# Patient Record
Sex: Male | Born: 1945 | Race: White | Hispanic: No | Marital: Married | Smoking: Never smoker
Health system: Southern US, Community
[De-identification: ages and names within clinical notes are randomized; demographics above are authoritative.]

## PROBLEM LIST (undated history)

## (undated) DIAGNOSIS — G629 Polyneuropathy, unspecified: Secondary | ICD-10-CM

## (undated) DIAGNOSIS — I10 Essential (primary) hypertension: Secondary | ICD-10-CM

## (undated) DIAGNOSIS — M1990 Unspecified osteoarthritis, unspecified site: Secondary | ICD-10-CM

---

## 1999-03-26 ENCOUNTER — Encounter: Admission: RE | Admit: 1999-03-26 | Discharge: 1999-04-21 | Payer: Self-pay | Admitting: Family Medicine

## 2002-11-22 ENCOUNTER — Ambulatory Visit (HOSPITAL_COMMUNITY): Admission: RE | Admit: 2002-11-22 | Discharge: 2002-11-22 | Payer: Self-pay | Admitting: *Deleted

## 2016-06-16 ENCOUNTER — Ambulatory Visit (INDEPENDENT_AMBULATORY_CARE_PROVIDER_SITE_OTHER): Payer: Commercial Managed Care - PPO | Admitting: Physical Medicine and Rehabilitation

## 2016-06-16 DIAGNOSIS — M47816 Spondylosis without myelopathy or radiculopathy, lumbar region: Secondary | ICD-10-CM

## 2016-06-16 DIAGNOSIS — M545 Low back pain: Secondary | ICD-10-CM | POA: Diagnosis not present

## 2016-06-16 DIAGNOSIS — M25552 Pain in left hip: Secondary | ICD-10-CM

## 2016-06-23 ENCOUNTER — Encounter (INDEPENDENT_AMBULATORY_CARE_PROVIDER_SITE_OTHER): Payer: Commercial Managed Care - PPO | Admitting: Physical Medicine and Rehabilitation

## 2016-06-23 DIAGNOSIS — M47816 Spondylosis without myelopathy or radiculopathy, lumbar region: Secondary | ICD-10-CM | POA: Diagnosis not present

## 2016-08-09 ENCOUNTER — Telehealth (INDEPENDENT_AMBULATORY_CARE_PROVIDER_SITE_OTHER): Payer: Self-pay | Admitting: Physical Medicine and Rehabilitation

## 2016-08-09 NOTE — Telephone Encounter (Signed)
Called patient to discuss/ schedule. Voicemail has not been set up.

## 2016-08-09 NOTE — Telephone Encounter (Signed)
If helped more than 50% then repeat, if unsure then ov

## 2016-08-29 ENCOUNTER — Encounter (INDEPENDENT_AMBULATORY_CARE_PROVIDER_SITE_OTHER): Payer: Self-pay | Admitting: Physical Medicine and Rehabilitation

## 2016-08-29 ENCOUNTER — Ambulatory Visit (INDEPENDENT_AMBULATORY_CARE_PROVIDER_SITE_OTHER): Payer: Commercial Managed Care - PPO | Admitting: Physical Medicine and Rehabilitation

## 2016-08-29 VITALS — BP 127/64 | HR 62 | Temp 98.3°F

## 2016-08-29 DIAGNOSIS — M545 Low back pain, unspecified: Secondary | ICD-10-CM | POA: Insufficient documentation

## 2016-08-29 DIAGNOSIS — G8929 Other chronic pain: Secondary | ICD-10-CM

## 2016-08-29 DIAGNOSIS — M47816 Spondylosis without myelopathy or radiculopathy, lumbar region: Secondary | ICD-10-CM | POA: Insufficient documentation

## 2016-08-29 MED ORDER — LIDOCAINE HCL (PF) 1 % IJ SOLN: 0.3300 mL | Freq: Once | INTRAMUSCULAR | Status: DC

## 2016-08-29 MED ORDER — METHYLPREDNISOLONE ACETATE 80 MG/ML IJ SUSP
80.0000 mg | Freq: Once | INTRAMUSCULAR | Status: AC
  Administered 2016-08-29: 80 mg

## 2016-08-29 NOTE — Procedures (Signed)
Lumbar Facet Joint Intra-Articular Injection(s) with Fluoroscopic Guidance  [name]      Date of Birth: 1946-06-23 MRN: [id number] PCP: [name],Ricardo LYNN, MD      Visit Date: 08/29/2016   Universal Protocol:    Date/Time: 12/18/174:25 PM  Consent Given By: the patient  Position: PRONE   Additional Comments: Vital signs were monitored before and after the procedure. Patient was prepped and draped in the usual sterile fashion. The correct patient, procedure, and site was verified.   Injection Procedure Details:  Procedure Site One Meds Administered:  Meds ordered this encounter  Medications  . lidocaine (PF) (XYLOCAINE) 1 % injection 0.3 mL  . methylPREDNISolone acetate (DEPO-MEDROL) injection 80 mg     Laterality: Left  Location/Site:  L4-L5 L5-S1  Needle size: 22 guage  Needle type: Spinal  Needle Placement: Articular  Findings:  -Contrast Used: 1 mL iohexol 180 mg iodine/mL   -Comments: Excellent flow of contrast producing a partial arthrogram.  Procedure Details: The fluoroscope beam is vertically oriented in AP, and the inferior recess is visualized beneath the lower pole of the inferior apophyseal process, which represents the target point for needle insertion. When direct visualization is difficult the target point is located at the medial projection of the vertebral pedicle. The region overlying each aforementioned target is locally anesthetized with a 1 to 2 ml. volume of 1% Lidocaine without Epinephrine.   The spinal needle was inserted into each of the above mentioned facet joints using biplanar fluoroscopic guidance. A 0.25 to 0.5 ml. volume of Isovue-250 was injected and a partial facet joint arthrogram was obtained. A single spot film was obtained of the resulting arthrogram.    One to 1.25 ml of the steroid/anesthetic solution was then injected into each of the facet joints noted above.   Additional Comments:  The patient tolerated the  procedure well Dressing: Band-Aid    Post-procedure details: Patient was observed during the procedure. Post-procedure instructions were reviewed.  Patient left the clinic in stable condition.

## 2016-08-29 NOTE — Progress Notes (Signed)
Ricardo Stewart Stewart - 70 y.o. male MRN [id number]  Date of birth: 1946-07-22  Office Visit Note: Visit Date: 08/29/2016 PCP: [name],[name], MD Referred by: Sigmund HazelMiller, Lisa, MD  Subjective: Chief Complaint  Patient presents with  . Lower Back - Pain   HPI: [name] is a very pleasant and active 70 year old gentleman whose history is well documented in our prior notes. Briefly is someone that we saw originally in 2010 through Dr. Prince RomeHilts. He was having a lot of left-sided low back and buttock and hip pain. 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 Left-sided facet joint block was almost a miracle and lasted quite a bit of time. We repeated this in 2012 when he once again did very well. He continues to ambulate and walk for 5 miles almost every day. He has no neurogenic claudication symptoms but a lot of low back pain with standing. Patient here today for left side low back pain. States he had good relief with last injection for a couple of weeks and then pain right back the way it was and more often. He feels like his pain is worsened to a degree. Reviewing the images shows well-placed injection. He has not had any pain down the legs or focal weakness. No trauma.    ROS Otherwise per HPI.  Assessment & Plan: Visit Diagnoses:  1. Spondylosis without myelopathy or radiculopathy, lumbar region   2. Chronic left-sided low back pain without sciatica     Plan: Findings:  Plan today is diagnostic of therapeutic left L4-5 and L5-S1 facet joints. I think doing the L4-5 region may help. He has arthropathy at both levels. He is somewhat of a transitional segment as well. The plan will be if he doesn't get much relief to update his MRI from 2010 to see if there is more worsening stenosis. I cannot rule out his pain being from stenosis although he does not have really any clear claudication symptoms or radicular complaints. We can also regroup with  physical therapy at some point and that he is re: had a personality knows it does very well for his wife. I think that would be beneficial for him since he is active. He could end up being a candidate for radiofrequency ablation depending on how the new imaging looks.    Meds & Orders:  Meds ordered this encounter  Medications  . lidocaine (PF) (XYLOCAINE) 1 % injection 0.3 mL  . methylPREDNISolone acetate (DEPO-MEDROL) injection 80 mg    Orders Placed This Encounter  Procedures  . Nerve Block    Follow-up: Return if symptoms worsen or fail to improve after 2 weeks we'll order an MRI of the lumbar spine.   Procedures: No procedures performed  Lumbar Facet Joint Intra-Articular Injection(s) with Fluoroscopic Guidance  [name]      Date of Birth: 1946-07-22 MRN: [id number] PCP: [name],[name], MD      Visit Date: 08/29/2016   Universal Protocol:    Date/Time: 12/18/174:25 PM  Consent Given By: the patient  Position: PRONE   Additional Comments: Vital signs were monitored before and after the procedure. Patient was prepped and draped in the usual sterile fashion. The correct patient, procedure, and site was verified.   Injection Procedure Details:  Procedure Site One Meds Administered:  Meds ordered this encounter  Medications  . lidocaine (PF) (XYLOCAINE) 1 % injection 0.3 mL  . methylPREDNISolone acetate (DEPO-MEDROL) injection 80 mg  Laterality: Left  Location/Site:  L4-L5 L5-S1  Needle size: 22 guage  Needle type: Spinal  Needle Placement: Articular  Findings:  -Contrast Used: 1 mL iohexol 180 mg iodine/mL   -Comments: Excellent flow of contrast producing a partial arthrogram.  Procedure Details: The fluoroscope beam is vertically oriented in AP, and the inferior recess is visualized beneath the lower pole of the inferior apophyseal process, which represents the target point for needle insertion. When direct visualization is difficult  the target point is located at the medial projection of the vertebral pedicle. The region overlying each aforementioned target is locally anesthetized with a 1 to 2 ml. volume of 1% Lidocaine without Epinephrine.   The spinal needle was inserted into each of the above mentioned facet joints using biplanar fluoroscopic guidance. A 0.25 to 0.5 ml. volume of Isovue-250 was injected and a partial facet joint arthrogram was obtained. A single spot film was obtained of the resulting arthrogram.    One to 1.25 ml of the steroid/anesthetic solution was then injected into each of the facet joints noted above.   Additional Comments:  The patient tolerated the procedure well Dressing: Band-Aid    Post-procedure details: Patient was observed during the procedure. Post-procedure instructions were reviewed.  Patient left the clinic in stable condition.       Clinical History: No specialty comments available.  He reports that he has never smoked. He has never used smokeless tobacco. No results for input(s): HGBA1C, LABURIC in the last 8760 hours.  Objective:  VS:  HT:    WT:   BMI:     BP:127/64  HR:62bpm  TEMP:98.3 F (36.8 C)(Oral)  RESP:94 % Physical Exam  Musculoskeletal:  The patient ambulates without aid. He has concordant pain with extension rotation to the left. He has no pain over the greater trochanter. He has good distal strength.    Ortho Exam Imaging: No results found.  Past Medical/Family/Surgical/Social History: Medications & Allergies reviewed per EMR Patient Active Problem List   Diagnosis Date Noted  . Spondylosis without myelopathy or radiculopathy, lumbar region 08/29/2016  . Chronic left-sided low back pain without sciatica 08/29/2016   No past medical history on file. No family history on file. No past surgical history on file. Social History   Occupational History  . Not on file.   Social History Main Topics  . Smoking status: Never Smoker  . Smokeless  tobacco: Never Used  . Alcohol use Not on file  . Drug use: Unknown  . Sexual activity: Not on file

## 2016-08-29 NOTE — Patient Instructions (Signed)

## 2017-07-17 ENCOUNTER — Telehealth (INDEPENDENT_AMBULATORY_CARE_PROVIDER_SITE_OTHER): Payer: Self-pay | Admitting: Physical Medicine and Rehabilitation

## 2017-07-17 NOTE — Telephone Encounter (Signed)
Left message for patient to call back  

## 2017-07-17 NOTE — Telephone Encounter (Signed)
yes

## 2017-07-18 NOTE — Telephone Encounter (Signed)
Scheduled for 08/02/17 at 1600 with driver. Patient requested ov at same time to discuss.

## 2017-08-01 ENCOUNTER — Ambulatory Visit (INDEPENDENT_AMBULATORY_CARE_PROVIDER_SITE_OTHER): Payer: Self-pay

## 2017-08-01 ENCOUNTER — Encounter (INDEPENDENT_AMBULATORY_CARE_PROVIDER_SITE_OTHER): Payer: Self-pay | Admitting: Physical Medicine and Rehabilitation

## 2017-08-01 ENCOUNTER — Ambulatory Visit (INDEPENDENT_AMBULATORY_CARE_PROVIDER_SITE_OTHER): Payer: Medicare HMO | Admitting: Physical Medicine and Rehabilitation

## 2017-08-01 VITALS — BP 132/66 | HR 63

## 2017-08-01 DIAGNOSIS — M5416 Radiculopathy, lumbar region: Secondary | ICD-10-CM

## 2017-08-01 DIAGNOSIS — M545 Low back pain: Secondary | ICD-10-CM

## 2017-08-01 DIAGNOSIS — M47816 Spondylosis without myelopathy or radiculopathy, lumbar region: Secondary | ICD-10-CM | POA: Diagnosis not present

## 2017-08-01 DIAGNOSIS — G8929 Other chronic pain: Secondary | ICD-10-CM

## 2017-08-01 MED ORDER — METHYLPREDNISOLONE ACETATE 80 MG/ML IJ SUSP: 80.0000 mg | Freq: Once | INTRAMUSCULAR | Status: DC

## 2017-08-01 MED ORDER — LIDOCAINE HCL (PF) 1 % IJ SOLN: 2.0000 mL | Freq: Once | INTRAMUSCULAR | Status: DC

## 2017-08-01 NOTE — Progress Notes (Deleted)
Patient states he is here today due to cyst on his back that are pressing on a nerve causing radicular left leg pain. Does report numbness in left leg. He is able to get relief occasionally after laying flat for a few hours.

## 2017-08-01 NOTE — Progress Notes (Signed)
Ricardo Stewart - 71 y.o. male MRN [id number]  Date of birth: 02-Nov-1945  Office Visit Note: Visit Date: 08/01/2017 PCP: Sigmund HazelMiller, Lisa, MD Referred by: Sigmund HazelMiller, Lisa, MD  Subjective: Chief Complaint  Patient presents with  . Lower Back - Pain  . Left Hip - Pain  . Left Leg - Pain, Numbness   HPI: Ricardo Stewart is a very pleasant and active 71 year old gentleman whose history is well documented in our prior notes. Briefly is someone that we saw originally in 2010 through Dr. Prince RomeHilts. He was having a lot of left-sided low back and buttock and hip pain. 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  Lumbar spine MRI is again reviewed that shows moderate stenosis at L4-5 and L5-S1 with likely a transitional segment.  He had mainly facet arthropathy with facet joint cyst impacting the canal.  Left-sided facet joint block was almost a miracle and lasted quite a bit of time. We repeated this in 2012 when he once again did very well. He continues to ambulate and walk for 5 miles almost every day. He has no neurogenic claudication symptoms but a lot of low back pain with standing.  We ended up repeating the facet joint block and December of last year he reports he got some relief but it was not as dramatic as in the past.  He again is having this left buttock pain but is also having some numbness in the leg as well.  He reports his best position is lying flat and that standing is the worst.  Again as noted above he continues to walk and really does not have difficulty walking.  He has had no new trauma.  No focal weakness.  No real right-sided complaints.  He asked today about physical therapy and other avenues of treatment.  Greater than 50% of this visit (total duration of visit was 25 minutes) was spent in counseling and coordination of care discussing facet arthropathy and stenosis and the need for future MRI as well as physical therapy and  exercises.     Review of Systems  Constitutional: Negative for chills, fever, malaise/fatigue and weight loss.  HENT: Negative for hearing loss and sinus pain.   Eyes: Negative for blurred vision, double vision and photophobia.  Respiratory: Negative for cough and shortness of breath.   Cardiovascular: Negative for chest pain, palpitations and leg swelling.  Gastrointestinal: Negative for abdominal pain, nausea and vomiting.  Genitourinary: Negative for flank pain.  Musculoskeletal: Positive for back pain and joint pain. Negative for myalgias.  Skin: Negative for itching and rash.  Neurological: Positive for tingling. Negative for tremors, focal weakness and weakness.  Endo/Heme/Allergies: Negative.   Psychiatric/Behavioral: Negative for depression.  All other systems reviewed and are negative.  Otherwise per HPI.  Assessment & Plan: Visit Diagnoses:  1. Spondylosis without myelopathy or radiculopathy, lumbar region   2. Chronic left-sided low back pain without sciatica   3. Lumbar radiculopathy     Plan: Findings:  Chronic worsening left more than right axial low back pain worse with standing but really no claudication symptoms with walking.  Last MRI was quite a while ago in 2010.  He likely has had some progression of the stenosis.  He may have had some regression of the cyst which we do see at times.  We talked at length about activity modification and exercises and physical therapy.  Best approach at this point to repeat the  facet joint block because it has helped and just see once and for all if that gives him any relief.  He gives him a great deal of relief but is short-lived we could talk about possible radiofrequency ablation.  If it is also very short-lived or does not help very much I think the next step would be MRI of the lumbar spine to look for worsening stenosis or change this would also help predict his future back issues.  Lastly depending on relief would look at getting  him into a physical therapy program for core strengthening and he can talk to them about different activities that he would like to pursue.  Overall he is doing fairly well compared to how his back looked at 2010.  I do think ultimately we are going to get an MRI of the lumbar spine.  The procedure was performed today due to the fact that he is having significant symptoms and they have helped in the past.    Meds & Orders:  Meds ordered this encounter  Medications  . lidocaine (PF) (XYLOCAINE) 1 % injection 2 mL  . methylPREDNISolone acetate (DEPO-MEDROL) injection 80 mg    Orders Placed This Encounter  Procedures  . Facet Injection  . XR C-ARM NO REPORT    Follow-up: Return if symptoms worsen or fail to improve, for ? need for MRI.   Procedures: No procedures performed  Lumbar Facet Joint Intra-Articular Injection(s) with Fluoroscopic Guidance  [name]      Date of Birth: [birth year] MRN: [id number] PCP: Sigmund Hazel, MD      Visit Date: 08/01/2017   Universal Protocol:    Date/Time: 08/01/2017  Consent Given By: the patient  Position: PRONE   Additional Comments: Vital signs were monitored before and after the procedure. Patient was prepped and draped in the usual sterile fashion. The correct patient, procedure, and site was verified.   Injection Procedure Details:  Procedure Site One Meds Administered:  Meds ordered this encounter  Medications  . lidocaine (PF) (XYLOCAINE) 1 % injection 2 mL  . methylPREDNISolone acetate (DEPO-MEDROL) injection 80 mg     Laterality: Left  Location/Site: Patient clearly has a realized S1 transitional segment. L4-L5 L5-S1  Needle size: 22 guage  Needle type: Spinal  Needle Placement: Articular  Findings:  -Contrast Used: 1 mL iohexol 180 mg iodine/mL   -Comments: Excellent flow of contrast producing a partial arthrogram.  Procedure Details: The fluoroscope beam is vertically oriented in AP, and the inferior  recess is visualized beneath the lower pole of the inferior apophyseal process, which represents the target point for needle insertion. When direct visualization is difficult the target point is located at the medial projection of the vertebral pedicle. The region overlying each aforementioned target is locally anesthetized with a 1 to 2 ml. volume of 1% Lidocaine without Epinephrine.   The spinal needle was inserted into each of the above mentioned facet joints using biplanar fluoroscopic guidance. A 0.25 to 0.5 ml. volume of Isovue-250 was injected and a partial facet joint arthrogram was obtained. A single spot film was obtained of the resulting arthrogram.    One to 1.25 ml of the steroid/anesthetic solution was then injected into each of the facet joints noted above.   Additional Comments:  The patient tolerated the procedure well Dressing: Band-Aid    Post-procedure details: Patient was observed during the procedure. Post-procedure instructions were reviewed.  Patient left the clinic in stable condition.     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 he has never used smokeless tobacco. No results for input(s): HGBA1C, LABURIC in the last 8760 hours.  Objective:  VS:  HT:    WT:   BMI:     BP:132/66  HR:63bpm  TEMP: ( )  RESP:  Physical Exam  Constitutional: He is oriented to person, place, and time. He appears well-developed and well-nourished. No distress.  HENT:  Head: Normocephalic and atraumatic.  Nose: Nose normal.  Mouth/Throat: Oropharynx is clear and moist.  Eyes: Conjunctivae are normal. Pupils are equal, round, and reactive to light.  Neck: Normal range of motion. Neck supple.  Cardiovascular: Regular rhythm and intact distal pulses.  Pulmonary/Chest: Effort normal and breath sounds normal.  Abdominal: Soft. He exhibits no distension.   Musculoskeletal: He exhibits no deformity.  Patient is somewhat slow to rise from a sitting position he does stand with a forward flexed lumbar spine.  He is very stiff with extension.  He has no pain over the greater trochanters.  He has no pain with hip rotation he has good distal strength.  Neurological: He is alert and oriented to person, place, and time. He exhibits normal muscle tone. Coordination normal.  Skin: Skin is warm. No rash noted.  Psychiatric: He has a normal mood and affect. His behavior is normal.  Nursing note and vitals reviewed.   Ortho Exam Imaging: Xr C-arm No Report  Result Date: 08/01/2017 Please see Notes or Procedures tab for imaging impression.   Past Medical/Family/Surgical/Social History: Medications & Allergies reviewed per EMR Patient Active Problem List   Diagnosis Date Noted  . Spondylosis without myelopathy or radiculopathy, lumbar region 08/29/2016  . Chronic left-sided low back pain without sciatica 08/29/2016   History reviewed. No pertinent past medical history. History reviewed. No pertinent family history. History reviewed. No pertinent surgical history. Social History   Occupational History  . Not on file  Tobacco Use  . Smoking status: Never Smoker  . Smokeless tobacco: Never Used  Substance and Sexual Activity  . Alcohol use: Not on file  . Drug use: Not on file  . Sexual activity: Not on file

## 2017-08-01 NOTE — Patient Instructions (Signed)

## 2017-08-02 ENCOUNTER — Encounter (INDEPENDENT_AMBULATORY_CARE_PROVIDER_SITE_OTHER): Payer: Self-pay | Admitting: Physical Medicine and Rehabilitation

## 2017-08-02 ENCOUNTER — Encounter (INDEPENDENT_AMBULATORY_CARE_PROVIDER_SITE_OTHER): Payer: Commercial Managed Care - PPO | Admitting: Physical Medicine and Rehabilitation

## 2017-08-02 NOTE — Procedures (Signed)
Lumbar Facet Joint Intra-Articular Injection(s) with Fluoroscopic Guidance  [name]      Date of Birth: 09-27-1945 MRN: [id number] PCP: Sigmund HazelMiller, Lisa, MD      Visit Date: 08/01/2017   Universal Protocol:    Date/Time: 08/01/2017  Consent Given By: the patient  Position: PRONE   Additional Comments: Vital signs were monitored before and after the procedure. Patient was prepped and draped in the usual sterile fashion. The correct patient, procedure, and site was verified.   Injection Procedure Details:  Procedure Site One Meds Administered:  Meds ordered this encounter  Medications  . lidocaine (PF) (XYLOCAINE) 1 % injection 2 mL  . methylPREDNISolone acetate (DEPO-MEDROL) injection 80 mg     Laterality: Left  Location/Site: Patient clearly has a realized S1 transitional segment. L4-L5 L5-S1  Needle size: 22 guage  Needle type: Spinal  Needle Placement: Articular  Findings:  -Contrast Used: 1 mL iohexol 180 mg iodine/mL   -Comments: Excellent flow of contrast producing a partial arthrogram.  Procedure Details: The fluoroscope beam is vertically oriented in AP, and the inferior recess is visualized beneath the lower pole of the inferior apophyseal process, which represents the target point for needle insertion. When direct visualization is difficult the target point is located at the medial projection of the vertebral pedicle. The region overlying each aforementioned target is locally anesthetized with a 1 to 2 ml. volume of 1% Lidocaine without Epinephrine.   The spinal needle was inserted into each of the above mentioned facet joints using biplanar fluoroscopic guidance. A 0.25 to 0.5 ml. volume of Isovue-250 was injected and a partial facet joint arthrogram was obtained. A single spot film was obtained of the resulting arthrogram.    One to 1.25 ml of the steroid/anesthetic solution was then injected into each of the facet joints noted  above.   Additional Comments:  The patient tolerated the procedure well Dressing: Band-Aid    Post-procedure details: Patient was observed during the procedure. Post-procedure instructions were reviewed.  Patient left the clinic in stable condition.

## 2018-01-12 DIAGNOSIS — S76319A Strain of muscle, fascia and tendon of the posterior muscle group at thigh level, unspecified thigh, initial encounter: Secondary | ICD-10-CM | POA: Diagnosis not present

## 2018-01-23 ENCOUNTER — Ambulatory Visit: Payer: Medicare HMO | Attending: Family Medicine | Admitting: Physical Therapy

## 2018-01-23 ENCOUNTER — Encounter: Payer: Self-pay | Admitting: Physical Therapy

## 2018-01-23 DIAGNOSIS — M25551 Pain in right hip: Secondary | ICD-10-CM | POA: Diagnosis not present

## 2018-01-23 NOTE — Therapy (Signed)
[facility] [address] [place], [place], [address] Phone: [phone]   Fax:  [phone]  Physical Therapy Evaluation  Patient Details  Name: Ricardo Stewart MRN: [id number] Date of Birth: 1946/05/06 Referring Provider: Juluis Rainier   Encounter Date: 01/23/2018  PT End of Session - 01/23/18 0828    Visit Number  1    Date for PT Re-Evaluation  03/25/18    PT Start Time  0758    PT Stop Time  0850    PT Time Calculation (min)  52 min    Activity Tolerance  Patient tolerated treatment well    Behavior During Therapy  [facility] for tasks assessed/performed       History reviewed. No pertinent past medical history.  History reviewed. No pertinent surgical history.  There were no vitals filed for this visit.   Subjective Assessment - 01/23/18 0755    Subjective  Patient reports that a few weeks ago he was doing yardwork pulling weeds, bending over and doing this repetitively.  Patient reports that he started some Flexeril and has a little less pain.      Limitations  Walking;House hold activities    Patient Stated Goals  have less pain, go back to yardwork    Currently in Pain?  Yes    Pain Score  1     Pain Location  Leg    Pain Orientation  Right;Posterior;Upper    Pain Descriptors / Indicators  Tightness;Aching    Pain Type  Acute pain    Pain Onset  1 to 4 weeks ago    Pain Frequency  Intermittent    Aggravating Factors   first thing in the AM, worse with sitting for any length of time, pain can be 5/10 described as tightness and some difficulty walking Squatting is very difficult    Pain Relieving Factors  flexeril, pain can be 0/10    Effect of Pain on Daily Activities  stiff, some pain and difficulty doing normal ADL's         [facility] PT Assessment - 01/23/18 0001      Assessment   Medical Diagnosis  right HS strain    Referring Provider  Juluis Rainier    Onset Date/Surgical Date  12/24/17    Prior  Therapy  no      Precautions   Precautions  None      Balance Screen   Has the patient fallen in the past 6 months  No    Has the patient had a decrease in activity level because of a fear of falling?   No    Is the patient reluctant to leave their home because of a fear of falling?   No      Home Environment   Additional Comments  has stairs at home, does yardwork      Prior Function   Level of Independence  Independent    Vocation  Full time employment    Vocation Requirements  travels, sits    Leisure  walks 3 miles a day      ROM / Strength   AROM / PROM / Strength  AROM;PROM;Strength      AROM   Overall AROM Comments  Lumbar flexion decreased 50% due to HS pain      Strength   Overall Strength Comments  4+/5 with some pain for right HS with MMT for knee fleixon      Flexibility  Soft Tissue Assessment /Muscle Length  yes    Hamstrings  very tight 40 degrees SLR    Quadriceps  very tight >12" heel from buttock    ITB  tight    Piriformis  tight      Palpation   Palpation comment  mild tenderness in the right HS origin and mid mm belly      Ambulation/Gait   Gait Comments  mild antalgic gait on the right especially when he first gets up, then smooths out                Objective measurements completed on examination: See above findings.      [facility] Adult PT Treatment/Exercise - 01/23/18 0001      Modalities   Modalities  Electrical Stimulation;Moist Heat      Moist Heat Therapy   Number Minutes Moist Heat  15 Minutes    Moist Heat Location  Hip      Electrical Stimulation   Electrical Stimulation Location  right HS    Electrical Stimulation Action  IFC    Electrical Stimulation Parameters  supine`    Electrical Stimulation Goals  Pain               PT Short Term Goals - 01/23/18 0925      PT SHORT TERM GOAL #1   Title  independent wiyth HEP    Time  2    Period  Weeks    Status  New        PT Long Term Goals - 01/23/18  9147      PT LONG TERM GOAL #1   Title  report no pain when getting up in the AM    Time  8    Period  Weeks    Status  New      PT LONG TERM GOAL #2   Title  walk without issue    Time  8    Period  Weeks    Status  New             Plan - 01/23/18 8295    Clinical Impression Statement  Patient reports right HS pain after pulling weeds about 4 weeks ago.  He reports that he started flexeril and is feeling better.  He is very very tight in the HS and the quads, SLR was 40 degrees with HS pain.  Has a significant limp with the first few steps and reports tightness and pain when waking up.    Clinical Presentation  Stable    Clinical Decision Making  Low    Rehab Potential  Good    PT Frequency  1x / week    PT Duration  8 weeks    PT Treatment/Interventions  ADLs/Self Care Home Management;Cryotherapy;Electrical Stimulation;Moist Heat;Iontophoresis /ml Dexamethasone;Gait training;Therapeutic exercise;Therapeutic activities;Patient/family education;Manual techniques    PT Next Visit Plan  Patient feels like he can do on his own, gave good HEP and went over with him, we will hold treatment    Consulted and Agree with Plan of Care  Patient       Patient will benefit from skilled therapeutic intervention in order to improve the following deficits and impairments:  Abnormal gait, Decreased mobility, Impaired flexibility, Improper body mechanics, Pain, Increased muscle spasms, Increased fascial restricitons, Difficulty walking, Decreased range of motion  Visit Diagnosis: Pain in right hip - Plan: PT plan of care cert/re-cert     Problem List Patient Active Problem List  Diagnosis Date Noted  . Spondylosis without myelopathy or radiculopathy, lumbar region 08/29/2016  . Chronic left-sided low back pain without sciatica 08/29/2016    Jearld Lesch., PT 01/23/2018, 9:35 AM  [facility] [address] [place], [place], [address] Phone: [phone]   Fax:  [phone]  Name: Ricardo Stewart MRN: [id number] Date of Birth: 01/25/1946

## 2018-01-23 NOTE — Patient Instructions (Signed)
Access Code: [email or web address]  URL: [email or web address]  Date: 01/23/2018  Prepared by: Stacie Glaze   Exercises  Supine Hamstring Stretch - 5 reps - 3 sets - 30 hold - 1x daily - 7x weekly  Seated Hamstring Stretch - 5 reps - 3 sets - 30 hold - 1x daily - 7x weekly  Supine Hamstring Stretch with Strap - 5 reps - 3 sets - 30 hold - 1x daily - 7x weekly  Seated Hamstring Stretch with Chair - 5 reps - 3 sets - 30 hold - 1x daily - 7x weekly  Supine Hamstring Stretch with Doorway - 5 reps - 3 sets - 30 hold - 1x daily - 7x weekly  Prone Quadriceps Stretch with Strap - 5 reps - 3 sets - 30 hold - 1x daily - 7x weekly

## 2018-01-31 ENCOUNTER — Ambulatory Visit (INDEPENDENT_AMBULATORY_CARE_PROVIDER_SITE_OTHER): Payer: Medicare HMO | Admitting: Physical Medicine and Rehabilitation

## 2018-01-31 ENCOUNTER — Encounter (INDEPENDENT_AMBULATORY_CARE_PROVIDER_SITE_OTHER): Payer: Self-pay | Admitting: Physical Medicine and Rehabilitation

## 2018-01-31 VITALS — BP 142/82 | HR 58 | Temp 98.3°F

## 2018-01-31 DIAGNOSIS — M5416 Radiculopathy, lumbar region: Secondary | ICD-10-CM | POA: Diagnosis not present

## 2018-01-31 DIAGNOSIS — M48062 Spinal stenosis, lumbar region with neurogenic claudication: Secondary | ICD-10-CM

## 2018-01-31 DIAGNOSIS — M5136 Other intervertebral disc degeneration, lumbar region: Secondary | ICD-10-CM | POA: Diagnosis not present

## 2018-01-31 DIAGNOSIS — M5116 Intervertebral disc disorders with radiculopathy, lumbar region: Secondary | ICD-10-CM | POA: Diagnosis not present

## 2018-01-31 DIAGNOSIS — M47816 Spondylosis without myelopathy or radiculopathy, lumbar region: Secondary | ICD-10-CM | POA: Diagnosis not present

## 2018-01-31 NOTE — Progress Notes (Signed)
 .  Numeric Pain Rating Scale and Functional Assessment Average Pain 7 Pain Right Now 3 My pain is intermittent, dull and aching Pain is worse with: walking and some activites Pain improves with: rest   In the last MONTH (on 0-10 scale) has pain interfered with the following?  1. General activity like being  able to carry out your everyday physical activities such as walking, climbing stairs, carrying groceries, or moving a chair?  Rating(5)  2. Relation with others like being able to carry out your usual social activities and roles such as  activities at home, at work and in your community. Rating(4)  3. Enjoyment of life such that you have  been bothered by emotional problems such as feeling anxious, depressed or irritable?  Rating(0)

## 2018-02-03 ENCOUNTER — Ambulatory Visit (HOSPITAL_BASED_OUTPATIENT_CLINIC_OR_DEPARTMENT_OTHER)
Admission: RE | Admit: 2018-02-03 | Discharge: 2018-02-03 | Disposition: A | Discharge status: Home / Self Care | Payer: Medicare HMO | Source: Ambulatory Visit | Attending: Physical Medicine and Rehabilitation | Admitting: Physical Medicine and Rehabilitation

## 2018-02-03 DIAGNOSIS — M545 Low back pain: Secondary | ICD-10-CM | POA: Diagnosis not present

## 2018-02-03 DIAGNOSIS — M48061 Spinal stenosis, lumbar region without neurogenic claudication: Secondary | ICD-10-CM | POA: Insufficient documentation

## 2018-02-03 DIAGNOSIS — M2578 Osteophyte, vertebrae: Secondary | ICD-10-CM | POA: Insufficient documentation

## 2018-02-03 DIAGNOSIS — M5136 Other intervertebral disc degeneration, lumbar region: Secondary | ICD-10-CM | POA: Diagnosis not present

## 2018-02-07 DIAGNOSIS — H2513 Age-related nuclear cataract, bilateral: Secondary | ICD-10-CM | POA: Diagnosis not present

## 2018-02-07 DIAGNOSIS — H04123 Dry eye syndrome of bilateral lacrimal glands: Secondary | ICD-10-CM | POA: Diagnosis not present

## 2018-02-07 DIAGNOSIS — H524 Presbyopia: Secondary | ICD-10-CM | POA: Diagnosis not present

## 2018-02-08 ENCOUNTER — Telehealth (INDEPENDENT_AMBULATORY_CARE_PROVIDER_SITE_OTHER): Payer: Self-pay | Admitting: Physical Medicine and Rehabilitation

## 2018-02-08 ENCOUNTER — Telehealth (INDEPENDENT_AMBULATORY_CARE_PROVIDER_SITE_OTHER): Payer: Self-pay | Admitting: *Deleted

## 2018-02-08 ENCOUNTER — Encounter (INDEPENDENT_AMBULATORY_CARE_PROVIDER_SITE_OTHER): Payer: Self-pay | Admitting: Physical Medicine and Rehabilitation

## 2018-02-08 NOTE — Telephone Encounter (Signed)
Last office note dictated in brief note was dictated reviewing the new MRI and rationale for S1 transforaminal injection.

## 2018-02-08 NOTE — Telephone Encounter (Signed)
MRI of the lumbar spine was obtained as per our last office note.  This can be reviewed in the imaging section and also in the specialty comments.  He has had resolution of prior large facet joint cyst but continues with moderate multifactorial stenosis and right disc osteophyte complex at L4-5 and severe facet arthropathy bilaterally at L5-S1 with left lateral recess narrowing and foraminal narrowing at L5.  His symptoms are still more consistent with an S1 type of pain.  I think the best approach instead of repeating facet joint blocks would be a diagnostic left S1 transforaminal epidural steroid injection.  We will try to get that approved without having the patient have to come back for second office visit given the fact that we felt like we would see a similar finding on the MRI.  Prior note can be hopefully utilized to get preauthorization.

## 2018-02-08 NOTE — Progress Notes (Signed)
Ricardo Stewart - 72 y.o. male MRN [id number]  Date of birth: 1946/08/25  Office Visit Note: Visit Date: 01/31/2018 PCP: Ricardo Hazel, MD Referred by: Ricardo Hazel, MD  Subjective: Chief Complaint  Patient presents with  . Lower Back - Pain  . Right Leg - Pain  . Left Leg - Pain   HPI: Ricardo Stewart is a very pleasant 72 year old gentleman who is very active range to be a runner who still continues to exercise regularly.  The last time I saw him was in November and we completed facet joint intra-articular injections with aspiration.  His history is such that he had an MRI from 2012 when we first saw him with fairly large facet joint cyst causing pretty considerable stenosis of the lumbar spine and he still was very functional even with the pretty significant stenosis.  He had some pain in the left buttock and hamstring area at the time but it did get relief with the injections.  He really did let us lying down very much.  He never did have any spine surgery.  Last injection we performed did seem to help quite a bit.  In the early part of May he had an incident where he had some pain in the right leg more in the hip area posterior lateral.  He felt like he had pulled a muscle and he did see his primary care physician.  He ultimately saw Ricardo Stewart and physical therapy at [facility].  He had been there once before a long time ago and once again this did seem to help the right side quite a bit.  He has had this persistent left buttock and hamstring pain however.  He gets some pulling in the hamstring on the right but that is much better after physical therapy.  The right leg did not have any weakness or foot drop.  The left leg is been a problem for over 5 years likely stated.  It is worse with standing and after a workday it will make the pain worse and get better with rest.  He does walk and exercise and only feels his son and actually the walking and exercise seems to help.  He said no paresthesias.   He is using anti-inflammatories with mild relief.   Review of Systems  Constitutional: Negative for chills, fever, malaise/fatigue and weight loss.  HENT: Negative for hearing loss and sinus pain.   Eyes: Negative for blurred vision, double vision and photophobia.  Respiratory: Negative for cough and shortness of breath.   Cardiovascular: Negative for chest pain, palpitations and leg swelling.  Gastrointestinal: Negative for abdominal pain, nausea and vomiting.  Genitourinary: Negative for flank pain.  Musculoskeletal: Positive for back pain and joint pain. Negative for myalgias.  Skin: Negative for itching and rash.  Neurological: Negative for tremors, focal weakness and weakness.  Endo/Heme/Allergies: Negative.   Psychiatric/Behavioral: Negative for depression.  All other systems reviewed and are negative.  Otherwise per HPI.  Assessment & Plan: Visit Diagnoses:  1. Lumbar radiculopathy   2. Spinal stenosis of lumbar region with neurogenic claudication   3. Radiculopathy due to lumbar intervertebral disc disorder   4. Spondylosis without myelopathy or radiculopathy, lumbar region   5. Other intervertebral disc degeneration, lumbar region     Plan: Findings:  Recent lumbar sprain strain with possible right hamstring strain which is getting better with physical therapy.  This is evaluated and managed by the primary care physician mainly.  His chronic left buttock and  hamstring pain which is persistent is still persistent.  It has not really worsened but it is still there and it really is worse after a workday and prolonged standing.  His average pain is a 7 out of 10 and does affect his activities of daily living which is reviewed below.  He has done well with facet joint blocks with the last one being in November.  After discussion with him we talked about the fact that the right side could be related to his spine but not has gotten better with physical therapy may not be directly related  to the muscle.  MRI from 2012 showed pretty severe stenosis to the facet joint cyst.  These facet joint cyst can resolve over time and he may have had some changes otherwise and may be a small disc herniation which could be a probability.  I think given the fact that his MRI is from 2012 we should go ahead and repeat this and just see what changes have taken place and see if it identifies any other area that we could possibly treat.  I think it would be wise just to see if there is been any progression.  He has no other red flag symptoms however.  I would anticipate injection after the MRI.  MRI of the lumbar spine was ordered.  No change in medication currently.    Meds & Orders: No orders of the defined types were placed in this encounter.   Orders Placed This Encounter  Procedures  . MR LUMBAR SPINE WO CONTRAST    Follow-up: Return for MRI review after completion.   Procedures: No procedures performed  No notes on file   Clinical History: Lumbar Spine 01/21/2011  MRI LUMBAR SPINE WITHOUT CONTRAST ly.  IMPRESSION:   1. Spondylosis and degenerative disc disease with dominant findings at the L4-5 and L5-S1 levels. 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 2. 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 If procedural intervention is to be performed, careful correlation with this numbering strategy is recommended.   He reports that he has never smoked. He has never used smokeless tobacco. No results for input(s): HGBA1C, LABURIC in the last 8760 hours.  Objective:  VS:  HT:    WT:   BMI:     BP:(!) 142/82  HR:(!) 58bpm  TEMP:98.3 F (36.8 C)( )  RESP:94 % Physical Exam  Constitutional: He is oriented to person, place, and time. He appears well-developed and well-nourished. No distress.  HENT:  Head: Normocephalic and atraumatic.  Eyes: Pupils  are equal, round, and reactive to light. Conjunctivae are normal.  Neck: Normal range of motion. Neck supple.  Cardiovascular: Regular rhythm and intact distal pulses.  Pulmonary/Chest: Effort normal. No respiratory distress.  Musculoskeletal:  Patient ambulates without aid.  He is somewhat slow to go from sit to stand in full extension.  He does have normal lordosis with no kyphosis of the upper thoracic spine.  No scoliosis.  No pain with hip rotation internal or external.  No pain over the greater trochanter.  He does have tight hamstrings bilaterally.  He has good distal strength without clonus.  He has no pain over the ischial bursa on the left.  Neurological: He is alert and oriented to person, place, and time. He exhibits normal muscle tone. Coordination normal.  Skin: Skin is warm and dry. No rash noted. No  erythema.  Psychiatric: He has a normal mood and affect.  Nursing note and vitals reviewed.   Ortho Exam Imaging: No results found.  Past Medical/Family/Surgical/Social History: Medications & Allergies reviewed per EMR, new medications updated. Patient Active Problem List   Diagnosis Date Noted  . Spondylosis without myelopathy or radiculopathy, lumbar region 08/29/2016  . Chronic left-sided low back pain without sciatica 08/29/2016   History reviewed. No pertinent past medical history. History reviewed. No pertinent family history. History reviewed. No pertinent surgical history. Social History   Occupational History  . Not on file  Tobacco Use  . Smoking status: Never Smoker  . Smokeless tobacco: Never Used  Substance and Sexual Activity  . Alcohol use: Not on file  . Drug use: Not on file  . Sexual activity: Not on file

## 2018-02-09 NOTE — Telephone Encounter (Signed)
Auth No / Request ID [id number] Status Auto-Approved Decision Approved Effective Date 02/12/2018 Expiration Date 03/29/2018

## 2018-02-09 NOTE — Telephone Encounter (Signed)
Called pt to schedule for S1 TF/MRI review left vm #1.

## 2018-02-19 NOTE — Telephone Encounter (Signed)
Completed.

## 2018-03-05 ENCOUNTER — Ambulatory Visit (INDEPENDENT_AMBULATORY_CARE_PROVIDER_SITE_OTHER): Payer: Medicare HMO | Admitting: Physical Medicine and Rehabilitation

## 2018-03-05 ENCOUNTER — Encounter (INDEPENDENT_AMBULATORY_CARE_PROVIDER_SITE_OTHER): Payer: Self-pay | Admitting: Physical Medicine and Rehabilitation

## 2018-03-05 ENCOUNTER — Ambulatory Visit (INDEPENDENT_AMBULATORY_CARE_PROVIDER_SITE_OTHER): Payer: Self-pay

## 2018-03-05 ENCOUNTER — Encounter

## 2018-03-05 VITALS — BP 142/82 | HR 63

## 2018-03-05 DIAGNOSIS — M48062 Spinal stenosis, lumbar region with neurogenic claudication: Secondary | ICD-10-CM | POA: Diagnosis not present

## 2018-03-05 DIAGNOSIS — M5116 Intervertebral disc disorders with radiculopathy, lumbar region: Secondary | ICD-10-CM | POA: Diagnosis not present

## 2018-03-05 DIAGNOSIS — M5416 Radiculopathy, lumbar region: Secondary | ICD-10-CM | POA: Diagnosis not present

## 2018-03-05 MED ORDER — BETAMETHASONE SOD PHOS & ACET 6 (3-3) MG/ML IJ SUSP
12.0000 mg | Freq: Once | INTRAMUSCULAR | Status: AC
  Administered 2018-03-05: 12 mg

## 2018-03-05 NOTE — Progress Notes (Signed)
 .  Numeric Pain Rating Scale and Functional Assessment Average Pain 5   In the last MONTH (on 0-10 scale) has pain interfered with the following?  1. General activity like being  able to carry out your everyday physical activities such as walking, climbing stairs, carrying groceries, or moving a chair?  Rating(4)   +Driver, -BT, -Dye Allergies.  

## 2018-03-05 NOTE — Patient Instructions (Signed)

## 2018-03-08 NOTE — Procedures (Signed)
S1 Lumbosacral Transforaminal Epidural Steroid Injection - Sub-Pedicular Approach with Fluoroscopic Guidance   [name]      Date of Birth: 1946-02-29 MRN: [id number] PCP: Sigmund HazelMiller, Lisa, MD      Visit Date: 03/05/2018   Universal Protocol:    Date/Time: 06/27/194:58 AM  Consent Given By: the patient  Position:  PRONE  Additional Comments: Vital signs were monitored before and after the procedure. Patient was prepped and draped in the usual sterile fashion. The correct patient, procedure, and site was verified.   Injection Procedure Details:  Procedure Site One Meds Administered:  Meds ordered this encounter  Medications  . betamethasone acetate-betamethasone sodium phosphate (CELESTONE) injection 12 mg    Laterality: Left  Location/Site:  S1 Foramen   Needle size: 22 ga.  Needle type: Spinal  Needle Placement: Transforaminal  Findings:   -Comments: Excellent flow of contrast along the nerve and into the epidural space.  S1 segment somewhat lumbarized.  Procedure Details: After squaring off the sacral end-plate to get a true AP view, the C-arm was positioned so that the best possible view of the S1 foramen was visualized. The soft tissues overlying this structure were infiltrated with 2-3 ml. of 1% Lidocaine without Epinephrine.    The spinal needle was inserted toward the target using a "trajectory" view along the fluoroscope beam.  Under AP and lateral visualization, the needle was advanced so it did not puncture dura. Biplanar projections were used to confirm position. Aspiration was confirmed to be negative for CSF and/or blood. A 1-2 ml. volume of Isovue-250 was injected and flow of contrast was noted at each level. Radiographs were obtained for documentation purposes.   After attaining the desired flow of contrast documented above, a 0.5 to 1.0 ml test dose of 0.25% Marcaine was injected into each respective transforaminal space.  The patient was  observed for 90 seconds post injection.  After no sensory deficits were reported, and normal lower extremity motor function was noted,   the above injectate was administered so that equal amounts of the injectate were placed at each foramen (level) into the transforaminal epidural space.   Additional Comments:  The patient tolerated the procedure well Dressing: Band-Aid    Post-procedure details: Patient was observed during the procedure. Post-procedure instructions were reviewed.  Patient left the clinic in stable condition.

## 2018-03-08 NOTE — Progress Notes (Signed)
Ricardo Stewart - 72 y.o. male MRN [id number]  Date of birth: 12/13/1945  Office Visit Note: Visit Date: 03/05/2018 PCP: Sigmund Hazel, MD Referred by: Sigmund Hazel, MD  Subjective: Chief Complaint  Patient presents with  . Lower Back - Pain  . Right Leg - Pain   HPI: Ricardo Stewart is a 72 year old gentleman who comes in today with his wife for review of new MRI of his lumbar spine with continued left buttock and hamstring pain which he rates his average pain is 5 out of 10.  He is really failed conservative care and he still tries to stay pretty active.  Recent bout of physical therapy and exercises helped the right side of his pain which he feels like now may have been more of a muscle pull.  He continues with his chronic left hip and leg pain.  MRI was reviewed with the patient is reviewed below.  Prior fairly large synovial cyst off the left L5-S1 facet joint has actually resolved since the prior MRI.  He still has significant arthritis with some lateral recess narrowing particular at the L5-S1 level.  He could have some mild nerve damage of the S1 nerve root do to the prior cyst although he has no strength loss.  We are going to go ahead today with a left S1 transforaminal injection.  He does have somewhat of a transitional segment with a lumbarized S1 segment.   ROS Otherwise per HPI.  Assessment & Plan: Visit Diagnoses:  1. Lumbar radiculopathy   2. Spinal stenosis of lumbar region with neurogenic claudication   3. Radiculopathy due to lumbar intervertebral disc disorder     Plan: No additional findings.   Meds & Orders:  Meds ordered this encounter  Medications  . betamethasone acetate-betamethasone sodium phosphate (CELESTONE) injection 12 mg    Orders Placed This Encounter  Procedures  . XR C-ARM NO REPORT  . Epidural Steroid injection    Follow-up: Return if symptoms worsen or fail to improve.   Procedures: No procedures performed  S1 Lumbosacral Transforaminal Epidural  Steroid Injection - Sub-Pedicular Approach with Fluoroscopic Guidance   [name]      Date of Birth: December 24, 1945 MRN: [id number] PCP: Sigmund Hazel, MD      Visit Date: 03/05/2018   Universal Protocol:    Date/Time: 06/27/194:58 AM  Consent Given By: the patient  Position:  PRONE  Additional Comments: Vital signs were monitored before and after the procedure. Patient was prepped and draped in the usual sterile fashion. The correct patient, procedure, and site was verified.   Injection Procedure Details:  Procedure Site One Meds Administered:  Meds ordered this encounter  Medications  . betamethasone acetate-betamethasone sodium phosphate (CELESTONE) injection 12 mg    Laterality: Left  Location/Site:  S1 Foramen   Needle size: 22 ga.  Needle type: Spinal  Needle Placement: Transforaminal  Findings:   -Comments: Excellent flow of contrast along the nerve and into the epidural space.  S1 segment somewhat lumbarized.  Procedure Details: After squaring off the sacral end-plate to get a true AP view, the C-arm was positioned so that the best possible view of the S1 foramen was visualized. The soft tissues overlying this structure were infiltrated with 2-3 ml. of 1% Lidocaine without Epinephrine.    The spinal needle was inserted toward the target using a "trajectory" view along the fluoroscope beam.  Under AP and lateral visualization, the needle was advanced so it did not puncture dura. Biplanar projections were  used to confirm position. Aspiration was confirmed to be negative for CSF and/or blood. A 1-2 ml. volume of Isovue-250 was injected and flow of contrast was noted at each level. Radiographs were obtained for documentation purposes.   After attaining the desired flow of contrast documented above, a 0.5 to 1.0 ml test dose of 0.25% Marcaine was injected into each respective transforaminal space.  The patient was observed for 90 seconds post injection.   After no sensory deficits were reported, and normal lower extremity motor function was noted,   the above injectate was administered so that equal amounts of the injectate were placed at each foramen (level) into the transforaminal epidural space.   Additional Comments:  The patient tolerated the procedure well Dressing: Band-Aid    Post-procedure details: Patient was observed during the procedure. Post-procedure instructions were reviewed.  Patient left the clinic in stable condition.    Clinical History: MRI LUMBAR SPINE WITHOUT CONTRAST  TECHNIQUE: Multiplanar, multisequence MR imaging of the lumbar spine was performed. No intravenous contrast was administered.  COMPARISON: 01/20/2011  FINDINGS: Segmentation: Standard.  Alignment: Physiologic.  Vertebrae: No fracture, evidence of discitis, or bone lesion.  Conus medullaris and cauda equina: Conus extends to the L1 level. Conus and cauda equina appear normal.  Paraspinal and other soft tissues: No acute paraspinal abnormality.  Disc levels:  Disc spaces: Severe degenerative disc disease with disc height loss at L4-5. Mild degenerative disc disease with mild disc height loss at T11-12 and T12-L1. Disc desiccation at L5-S1 and L3-4.  T12-L1: Small right paracentral disc protrusion. No evidence of neural foraminal stenosis. No central canal stenosis.  L1-L2: No significant disc bulge. No evidence of neural foraminal stenosis. No central canal stenosis.  L2-L3: No significant disc bulge. No evidence of neural foraminal stenosis. No central canal stenosis.  L3-L4: Broad-based disc bulge. Mild bilateral facet arthropathy. Mild spinal stenosis. No evidence of neural foraminal stenosis.  L4-L5: Broad-based disc bulge with a right lateral disc osteophyte complex. Moderate bilateral facet arthropathy. Moderate spinal stenosis and bilateral lateral recess stenosis. No left foraminal stenosis. Moderate  right foraminal stenosis.  L5-S1: Broad-based disc bulge eccentric towards the left. Severe bilateral facet arthropathy with ligamentum flavum infolding. Left lateral recess stenosis. Mild spinal stenosis. Severe left foraminal stenosis. Mild right foraminal stenosis. 3 mm left facet posterior extra-spinal synovial cyst.  IMPRESSION: 1. At L4-5 there is a broad-based disc bulge with a right lateral disc osteophyte complex. Moderate bilateral facet arthropathy. Moderate spinal stenosis and bilateral lateral recess stenosis. No left foraminal stenosis. Moderate right foraminal stenosis. 2. At L5-S1 there is a broad-based disc bulge eccentric towards the left. Severe bilateral facet arthropathy with ligamentum flavum infolding. Left lateral recess stenosis. Mild spinal stenosis. Severe left foraminal stenosis. Mild right foraminal stenosis. 3 mm left facet posterior extra-spinal synovial cyst. 3. Interval resolution of large intraspinal synovial cyst at L5-S1 and a smaller intraspinal synovial cyst at L4-5.   Electronically Signed By: Elige KoHetal Patel On: 02/03/2018 18:33   Lumbar Spine 01/21/2011  MRI LUMBAR SPINE WITHOUT CONTRAST l  IMPRESSION:   1. Spondylosis and degenerative disc disease with dominant findings at the L4-5 and L5-S1 levels. 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 2. 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 If procedural intervention is to be performed, careful correlation with this numbering strategy is recommended.   He reports that he has never  smoked. He has never used smokeless tobacco. No results for input(s): HGBA1C, LABURIC in the last 8760 hours.  Objective:  VS:  HT:    WT:   BMI:     BP:(!) 142/82  HR:63bpm  TEMP: ( )  RESP:  Physical Exam  Ortho Exam Imaging: No results found.  Past  Medical/Family/Surgical/Social History: Medications & Allergies reviewed per EMR, new medications updated. Patient Active Problem List   Diagnosis Date Noted  . Spondylosis without myelopathy or radiculopathy, lumbar region 08/29/2016  . Chronic left-sided low back pain without sciatica 08/29/2016   History reviewed. No pertinent past medical history. History reviewed. No pertinent family history. History reviewed. No pertinent surgical history. Social History   Occupational History  . Not on file  Tobacco Use  . Smoking status: Never Smoker  . Smokeless tobacco: Never Used  Substance and Sexual Activity  . Alcohol use: Not on file  . Drug use: Not on file  . Sexual activity: Not on file

## 2018-04-04 ENCOUNTER — Telehealth (INDEPENDENT_AMBULATORY_CARE_PROVIDER_SITE_OTHER): Payer: Self-pay | Admitting: Physical Medicine and Rehabilitation

## 2018-04-04 NOTE — Telephone Encounter (Signed)
Called pt and left vm#1.

## 2018-04-04 NOTE — Telephone Encounter (Signed)
Definitely repeat and we can talk at visit

## 2018-04-04 NOTE — Telephone Encounter (Signed)
Auth No / Request ID [id number] Status Auto-Approved Decision Approved Effective Date 04/09/2018 Expiration Date 05/24/2018

## 2018-04-04 NOTE — Telephone Encounter (Signed)
Needs auth and scheduling for left S1 TF- [id number].

## 2018-04-05 NOTE — Telephone Encounter (Signed)
Called pt and left vm #2.  

## 2018-04-09 NOTE — Telephone Encounter (Signed)
Pt scheduled with driver 1/61/098/16/19

## 2018-04-27 ENCOUNTER — Ambulatory Visit (INDEPENDENT_AMBULATORY_CARE_PROVIDER_SITE_OTHER): Payer: Medicare HMO | Admitting: Physical Medicine and Rehabilitation

## 2018-04-27 ENCOUNTER — Encounter (INDEPENDENT_AMBULATORY_CARE_PROVIDER_SITE_OTHER): Payer: Self-pay | Admitting: Physical Medicine and Rehabilitation

## 2018-04-27 ENCOUNTER — Ambulatory Visit (INDEPENDENT_AMBULATORY_CARE_PROVIDER_SITE_OTHER): Payer: Self-pay

## 2018-04-27 VITALS — BP 139/77 | HR 56

## 2018-04-27 DIAGNOSIS — M5416 Radiculopathy, lumbar region: Secondary | ICD-10-CM | POA: Diagnosis not present

## 2018-04-27 MED ORDER — BETAMETHASONE SOD PHOS & ACET 6 (3-3) MG/ML IJ SUSP
12.0000 mg | Freq: Once | INTRAMUSCULAR | Status: AC
  Administered 2018-04-27: 12 mg

## 2018-04-27 NOTE — Patient Instructions (Signed)

## 2018-04-27 NOTE — Progress Notes (Signed)
 .  Numeric Pain Rating Scale and Functional Assessment Average Pain 5   In the last MONTH (on 0-10 scale) has pain interfered with the following?  1. General activity like being  able to carry out your everyday physical activities such as walking, climbing stairs, carrying groceries, or moving a chair?  Rating(3)   +Driver, -BT, -Dye Allergies.  

## 2018-05-08 NOTE — Procedures (Signed)
S1 Lumbosacral Transforaminal Epidural Steroid Injection - Sub-Pedicular Approach with Fluoroscopic Guidance   [name]      Date of Birth: [birth year] MRN: [id number] PCP: Sigmund HazelMiller, Lisa, MD      Visit Date: 04/27/2018   Universal Protocol:    Date/Time: 08/27/195:40 AM  Consent Given By: the patient  Position:  PRONE  Additional Comments: Vital signs were monitored before and after the procedure. Patient was prepped and draped in the usual sterile fashion. The correct patient, procedure, and site was verified.   Injection Procedure Details:  Procedure Site One Meds Administered:  Meds ordered this encounter  Medications  . betamethasone acetate-betamethasone sodium phosphate (CELESTONE) injection 12 mg    Laterality: Left  Location/Site:  S1 Foramen   Needle size: 22 ga.  Needle type: Spinal  Needle Placement: Transforaminal  Findings:   -Comments: Excellent flow of contrast along the nerve and into the epidural space.  Procedure Details: After squaring off the sacral end-plate to get a true AP view, the C-arm was positioned so that the best possible view of the S1 foramen was visualized. The soft tissues overlying this structure were infiltrated with 2-3 ml. of 1% Lidocaine without Epinephrine.    The spinal needle was inserted toward the target using a "trajectory" view along the fluoroscope beam.  Under AP and lateral visualization, the needle was advanced so it did not puncture dura. Biplanar projections were used to confirm position. Aspiration was confirmed to be negative for CSF and/or blood. A 1-2 ml. volume of Isovue-250 was injected and flow of contrast was noted at each level. Radiographs were obtained for documentation purposes.   After attaining the desired flow of contrast documented above, a 0.5 to 1.0 ml test dose of 0.25% Marcaine was injected into each respective transforaminal space.  The patient was observed for 90 seconds post  injection.  After no sensory deficits were reported, and normal lower extremity motor function was noted,   the above injectate was administered so that equal amounts of the injectate were placed at each foramen (level) into the transforaminal epidural space.   Additional Comments:  The patient tolerated the procedure well Dressing: Band-Aid    Post-procedure details: Patient was observed during the procedure. Post-procedure instructions were reviewed.  Patient left the clinic in stable condition.

## 2018-05-08 NOTE — Progress Notes (Signed)
Ricardo Stewart - 72 y.o. male MRN [id number]  Date of birth: 04-06-1946  Office Visit Note: Visit Date: 04/27/2018 PCP: Sigmund Hazel, MD Referred by: Sigmund Hazel, MD  Subjective: Chief Complaint  Patient presents with  . Lower Back - Pain  . Left Leg - Pain   HPI: Ricardo Stewart is a 72 year old gentleman fairly active with chronic history of left buttock and hamstring pain.  Brief history is that in the remote past he was seeing Dr. Prince Rome in our office and they initially felt like he was having more musculoskeletal and hamstring type pain.  MRI eventually showed pretty significant facet joint cyst of the lower lumbar region.  Patient does have transitional lumbar anatomy.  Facet joint cyst aspiration at the time was very beneficial and patient did well.  More recently has had return of complaints of the left hamstring and buttock pain as well as some on the right.  Physical therapy seem to help the right but not the left.  We did end up repeating MRI of the lumbar spine.  Prior cystic structure has resolved.  There is a posterior facet joint cyst but not impacting the nerve roots.  Patient does have moderate stenosis at L4-5.  He also has a left lateral recess disc protrusion and severe facet arthropathy at L5-S1.  He also has foraminal narrowing at L5.  Interestingly his pain is more of an S1 distribution but he does have a transitional segment.  S1 injection recently did give him 100% relief but in 10 days or so it started to come back.  I think the best plan is to repeat that injection with thought process of looking either at the L5 transforaminal space or potentially L4-5 epidural for the stenosis even though it is moderate.  Also may have him return to see Dr. Prince Rome who is in our practice once again.   ROS Otherwise per HPI.  Assessment & Plan: Visit Diagnoses:  1. Lumbar radiculopathy     Plan: No additional findings.   Meds & Orders:  Meds ordered this encounter  Medications  .  betamethasone acetate-betamethasone sodium phosphate (CELESTONE) injection 12 mg    Orders Placed This Encounter  Procedures  . XR C-ARM NO REPORT  . Epidural Steroid injection    Follow-up: Return if symptoms worsen or fail to improve.   Procedures: No procedures performed  S1 Lumbosacral Transforaminal Epidural Steroid Injection - Sub-Pedicular Approach with Fluoroscopic Guidance   [name]      Date of Birth: [birth year] MRN: [id number] PCP: Sigmund Hazel, MD      Visit Date: 04/27/2018   Universal Protocol:    Date/Time: 70/27/195:40 AM  Consent Given By: the patient  Position:  PRONE  Additional Comments: Vital signs were monitored before and after the procedure. Patient was prepped and draped in the usual sterile fashion. The correct patient, procedure, and site was verified.   Injection Procedure Details:  Procedure Site One Meds Administered:  Meds ordered this encounter  Medications  . betamethasone acetate-betamethasone sodium phosphate (CELESTONE) injection 12 mg    Laterality: Left  Location/Site:  S1 Foramen   Needle size: 22 ga.  Needle type: Spinal  Needle Placement: Transforaminal  Findings:   -Comments: Excellent flow of contrast along the nerve and into the epidural space.  Procedure Details: After squaring off the sacral end-plate to get a true AP view, the C-arm was positioned so that the best possible view of the S1 foramen was visualized. The soft  tissues overlying this structure were infiltrated with 2-3 ml. of 1% Lidocaine without Epinephrine.    The spinal needle was inserted toward the target using a "trajectory" view along the fluoroscope beam.  Under AP and lateral visualization, the needle was advanced so it did not puncture dura. Biplanar projections were used to confirm position. Aspiration was confirmed to be negative for CSF and/or blood. A 1-2 ml. volume of Isovue-250 was injected and flow of contrast was noted at  each level. Radiographs were obtained for documentation purposes.   After attaining the desired flow of contrast documented above, a 0.5 to 1.0 ml test dose of 0.25% Marcaine was injected into each respective transforaminal space.  The patient was observed for 90 seconds post injection.  After no sensory deficits were reported, and normal lower extremity motor function was noted,   the above injectate was administered so that equal amounts of the injectate were placed at each foramen (level) into the transforaminal epidural space.   Additional Comments:  The patient tolerated the procedure well Dressing: Band-Aid    Post-procedure details: Patient was observed during the procedure. Post-procedure instructions were reviewed.  Patient left the clinic in stable condition.    Clinical History: MRI LUMBAR SPINE WITHOUT CONTRAST  TECHNIQUE: Multiplanar, multisequence MR imaging of the lumbar spine was performed. No intravenous contrast was administered.  COMPARISON: 01/20/2011  FINDINGS: Segmentation: Standard.  Alignment: Physiologic.  Vertebrae: No fracture, evidence of discitis, or bone lesion.  Conus medullaris and cauda equina: Conus extends to the L1 level. Conus and cauda equina appear normal.  Paraspinal and other soft tissues: No acute paraspinal abnormality.  Disc levels:  Disc spaces: Severe degenerative disc disease with disc height loss at L4-5. Mild degenerative disc disease with mild disc height loss at T11-12 and T12-L1. Disc desiccation at L5-S1 and L3-4.  T12-L1: Small right paracentral disc protrusion. No evidence of neural foraminal stenosis. No central canal stenosis.  L1-L2: No significant disc bulge. No evidence of neural foraminal stenosis. No central canal stenosis.  L2-L3: No significant disc bulge. No evidence of neural foraminal stenosis. No central canal stenosis.  L3-L4: Broad-based disc bulge. Mild bilateral facet  arthropathy. Mild spinal stenosis. No evidence of neural foraminal stenosis.  L4-L5: Broad-based disc bulge with a right lateral disc osteophyte complex. Moderate bilateral facet arthropathy. Moderate spinal stenosis and bilateral lateral recess stenosis. No left foraminal stenosis. Moderate right foraminal stenosis.  L5-S1: Broad-based disc bulge eccentric towards the left. Severe bilateral facet arthropathy with ligamentum flavum infolding. Left lateral recess stenosis. Mild spinal stenosis. Severe left foraminal stenosis. Mild right foraminal stenosis. 3 mm left facet posterior extra-spinal synovial cyst.  IMPRESSION: 1. At L4-5 there is a broad-based disc bulge with a right lateral disc osteophyte complex. Moderate bilateral facet arthropathy. Moderate spinal stenosis and bilateral lateral recess stenosis. No left foraminal stenosis. Moderate right foraminal stenosis. 2. At L5-S1 there is a broad-based disc bulge eccentric towards the left. Severe bilateral facet arthropathy with ligamentum flavum infolding. Left lateral recess stenosis. Mild spinal stenosis. Severe left foraminal stenosis. Mild right foraminal stenosis. 3 mm left facet posterior extra-spinal synovial cyst. 3. Interval resolution of large intraspinal synovial cyst at L5-S1 and a smaller intraspinal synovial cyst at L4-5.   Electronically Signed By: Elige KoHetal Patel On: 02/03/2018 18:33   Lumbar Spine 01/21/2011  MRI LUMBAR SPINE WITHOUT CONTRAST l  IMPRESSION:   1. Spondylosis and degenerative disc disease with dominant findings at the L4-5 and L5-S1 levels. At both of these levels,  synovial cysts extending in the spinal canal caudad to the originating level contribute to considerable central stenosis. 2. 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 If procedural intervention is to be performed, careful correlation with this numbering strategy is recommended.   He  reports that he has never smoked. He has never used smokeless tobacco. No results for input(s): HGBA1C, LABURIC in the last 8760 hours.  Objective:  VS:  HT:    WT:   BMI:     BP:139/77  HR:(!) 56bpm  TEMP: ( )  RESP:  Physical Exam  Ortho Exam Imaging: No results found.  Past Medical/Family/Surgical/Social History: Medications & Allergies reviewed per EMR, new medications updated. Patient Active Problem List   Diagnosis Date Noted  . Spondylosis without myelopathy or radiculopathy, lumbar region 08/29/2016  . Chronic left-sided low back pain without sciatica 08/29/2016   History reviewed. No pertinent past medical history. History reviewed. No pertinent family history. History reviewed. No pertinent surgical history. Social History   Occupational History  . Not on file  Tobacco Use  . Smoking status: Never Smoker  . Smokeless tobacco: Never Used  Substance and Sexual Activity  . Alcohol use: Not on file  . Drug use: Not on file  . Sexual activity: Not on file

## 2018-05-28 ENCOUNTER — Telehealth (INDEPENDENT_AMBULATORY_CARE_PROVIDER_SITE_OTHER): Payer: Self-pay | Admitting: Physical Medicine and Rehabilitation

## 2018-05-28 NOTE — Telephone Encounter (Signed)
Called pt and person answered the phone states he will be out of state until Wednesday and will call us back then.

## 2018-05-28 NOTE — Telephone Encounter (Signed)
For Code [id number]  Auth No / Request ID [id number] Status Auto-Approved Decision Approved Effective Date 06/04/2018 Expiration Date 07/19/2018  For Code [id number] per Francine [employer] representative does not need authorization it is a joint injection. [id number]#[id number]

## 2018-05-28 NOTE — Telephone Encounter (Signed)
Repeat vs L5 TF vs piriformis injection if insurance allows

## 2018-05-28 NOTE — Telephone Encounter (Signed)
Can we get auth for both [id number] and [id number]?

## 2018-06-01 DIAGNOSIS — J0100 Acute maxillary sinusitis, unspecified: Secondary | ICD-10-CM | POA: Diagnosis not present

## 2018-06-01 DIAGNOSIS — I10 Essential (primary) hypertension: Secondary | ICD-10-CM | POA: Diagnosis not present

## 2018-06-11 ENCOUNTER — Ambulatory Visit (INDEPENDENT_AMBULATORY_CARE_PROVIDER_SITE_OTHER): Payer: Medicare HMO | Admitting: Physical Medicine and Rehabilitation

## 2018-06-11 ENCOUNTER — Encounter (INDEPENDENT_AMBULATORY_CARE_PROVIDER_SITE_OTHER): Payer: Self-pay | Admitting: Physical Medicine and Rehabilitation

## 2018-06-11 ENCOUNTER — Ambulatory Visit (INDEPENDENT_AMBULATORY_CARE_PROVIDER_SITE_OTHER): Payer: Self-pay

## 2018-06-11 VITALS — BP 146/87 | HR 53 | Temp 98.2°F

## 2018-06-11 DIAGNOSIS — M5416 Radiculopathy, lumbar region: Secondary | ICD-10-CM | POA: Diagnosis not present

## 2018-06-11 DIAGNOSIS — M48062 Spinal stenosis, lumbar region with neurogenic claudication: Secondary | ICD-10-CM

## 2018-06-11 DIAGNOSIS — M5116 Intervertebral disc disorders with radiculopathy, lumbar region: Secondary | ICD-10-CM

## 2018-06-11 MED ORDER — METHYLPREDNISOLONE ACETATE 80 MG/ML IJ SUSP
80.0000 mg | Freq: Once | INTRAMUSCULAR | Status: AC
  Administered 2018-06-11: 80 mg

## 2018-06-11 NOTE — Progress Notes (Signed)
 .  Numeric Pain Rating Scale and Functional Assessment Average Pain 7   In the last MONTH (on 0-10 scale) has pain interfered with the following?  1. General activity like being  able to carry out your everyday physical activities such as walking, climbing stairs, carrying groceries, or moving a chair?  Rating(5)   +Driver, -BT, -Dye Allergies.  

## 2018-06-11 NOTE — Procedures (Signed)
Lumbosacral Transforaminal Epidural Steroid Injection - Sub-Pedicular Approach with Fluoroscopic Guidance  [name]      Date of Birth: 1946-03-26 MRN: [id number] PCP: Sigmund Hazel, MD      Visit Date: 06/11/2018   Universal Protocol:    Date/Time: 06/11/2018  Consent Given By: the patient  Position: PRONE  Additional Comments: Vital signs were monitored before and after the procedure. Patient was prepped and draped in the usual sterile fashion. The correct patient, procedure, and site was verified.   Injection Procedure Details:  Procedure Site One Meds Administered:  Meds ordered this encounter  Medications  . methylPREDNISolone acetate (DEPO-MEDROL) injection 80 mg    Laterality: Left  Location/Site: Transitional S1 segment L5-S1  Needle size: 22 G  Needle type: Spinal  Needle Placement: Transforaminal  Findings:    -Comments: Excellent flow of contrast along the nerve and into the epidural space.  Procedure Details: After squaring off the end-plates to get a true AP view, the C-arm was positioned so that an oblique view of the foramen as noted above was visualized. The target area is just inferior to the "nose of the scotty dog" or sub pedicular. The soft tissues overlying this structure were infiltrated with 2-3 ml. of 1% Lidocaine without Epinephrine.  The spinal needle was inserted toward the target using a "trajectory" view along the fluoroscope beam.  Under AP and lateral visualization, the needle was advanced so it did not puncture dura and was located close the 6 O'Clock position of the pedical in AP tracterory. Biplanar projections were used to confirm position. Aspiration was confirmed to be negative for CSF and/or blood. A 1-2 ml. volume of Isovue-250 was injected and flow of contrast was noted at each level. Radiographs were obtained for documentation purposes.   After attaining the desired flow of contrast documented above, a 0.5 to 1.0 ml  test dose of 0.25% Marcaine was injected into each respective transforaminal space.  The patient was observed for 90 seconds post injection.  After no sensory deficits were reported, and normal lower extremity motor function was noted,   the above injectate was administered so that equal amounts of the injectate were placed at each foramen (level) into the transforaminal epidural space.   Additional Comments:  The patient tolerated the procedure well Dressing: Band-Aid    Post-procedure details: Patient was observed during the procedure. Post-procedure instructions were reviewed.  Patient left the clinic in stable condition.

## 2018-06-11 NOTE — Progress Notes (Signed)
Ricardo Stewart - 72 y.o. male MRN [id number]  Date of birth: [birth year]  Office Visit Note: Visit Date: 06/11/2018 PCP: Sigmund Hazel, MD Referred by: Sigmund Hazel, MD  Subjective: Chief Complaint  Patient presents with  . Left Leg - Pain   HPI: Ricardo Stewart is a active 72 year old gentleman with history of chronic left low back and buttock pain that is worsened over the last several months.  Prior to that we would see him intermittently for facet joint aspiration and injection that gave him quite a bit of relief.  After failing repeat facet joint block over the last year we repeated lumbar spine MRI showing resolution of the facet joint cyst but continued facet joint arthropathy with left-sided foraminal narrowing at L5.  Patient has a transitional S1 segment.  We have completed to S1 transforaminal injections both giving him good relief but very temporary.  We are going to try an L5 injection but the stenosis is present this will be diagnostic and therapeutic hopefully.  Alternatives would be left ischial bursa or piriformis type syndrome.  If she does not get relief would consider repeating MRI.  MRI was done in 2015.   ROS Otherwise per HPI.  Assessment & Plan: Visit Diagnoses:  1. Lumbar radiculopathy   2. Radiculopathy due to lumbar intervertebral disc disorder   3. Spinal stenosis of lumbar region with neurogenic claudication     Plan: No additional findings.   Meds & Orders:  Meds ordered this encounter  Medications  . methylPREDNISolone acetate (DEPO-MEDROL) injection 80 mg    Orders Placed This Encounter  Procedures  . XR C-ARM NO REPORT  . Epidural Steroid injection    Follow-up: Return if symptoms worsen or fail to improve.   Procedures: No procedures performed  Lumbosacral Transforaminal Epidural Steroid Injection - Sub-Pedicular Approach with Fluoroscopic Guidance  [name]      Date of Birth: 1946/05/07 MRN: [id number] PCP: Sigmund Hazel,  MD      Visit Date: 06/11/2018   Universal Protocol:    Date/Time: 06/11/2018  Consent Given By: the patient  Position: PRONE  Additional Comments: Vital signs were monitored before and after the procedure. Patient was prepped and draped in the usual sterile fashion. The correct patient, procedure, and site was verified.   Injection Procedure Details:  Procedure Site One Meds Administered:  Meds ordered this encounter  Medications  . methylPREDNISolone acetate (DEPO-MEDROL) injection 80 mg    Laterality: Left  Location/Site: Transitional S1 segment L5-S1  Needle size: 22 G  Needle type: Spinal  Needle Placement: Transforaminal  Findings:    -Comments: Excellent flow of contrast along the nerve and into the epidural space.  Procedure Details: After squaring off the end-plates to get a true AP view, the C-arm was positioned so that an oblique view of the foramen as noted above was visualized. The target area is just inferior to the "nose of the scotty dog" or sub pedicular. The soft tissues overlying this structure were infiltrated with 2-3 ml. of 1% Lidocaine without Epinephrine.  The spinal needle was inserted toward the target using a "trajectory" view along the fluoroscope beam.  Under AP and lateral visualization, the needle was advanced so it did not puncture dura and was located close the 6 O'Clock position of the pedical in AP tracterory. Biplanar projections were used to confirm position. Aspiration was confirmed to be negative for CSF and/or blood. A 1-2 ml. volume of Isovue-250 was injected and flow of contrast was  noted at each level. Radiographs were obtained for documentation purposes.   After attaining the desired flow of contrast documented above, a 0.5 to 1.0 ml test dose of 0.25% Marcaine was injected into each respective transforaminal space.  The patient was observed for 90 seconds post injection.  After no sensory deficits were reported, and normal lower  extremity motor function was noted,   the above injectate was administered so that equal amounts of the injectate were placed at each foramen (level) into the transforaminal epidural space.   Additional Comments:  The patient tolerated the procedure well Dressing: Band-Aid    Post-procedure details: Patient was observed during the procedure. Post-procedure instructions were reviewed.  Patient left the clinic in stable condition.    Clinical History: MRI LUMBAR SPINE WITHOUT CONTRAST  TECHNIQUE: Multiplanar, multisequence MR imaging of the lumbar spine was performed. No intravenous contrast was administered.  COMPARISON: 01/20/2011  FINDINGS: Segmentation: Standard.  Alignment: Physiologic.  Vertebrae: No fracture, evidence of discitis, or bone lesion.  Conus medullaris and cauda equina: Conus extends to the L1 level. Conus and cauda equina appear normal.  Paraspinal and other soft tissues: No acute paraspinal abnormality.  Disc levels:  Disc spaces: Severe degenerative disc disease with disc height loss at L4-5. Mild degenerative disc disease with mild disc height loss at T11-12 and T12-L1. Disc desiccation at L5-S1 and L3-4.  T12-L1: Small right paracentral disc protrusion. No evidence of neural foraminal stenosis. No central canal stenosis.  L1-L2: No significant disc bulge. No evidence of neural foraminal stenosis. No central canal stenosis.  L2-L3: No significant disc bulge. No evidence of neural foraminal stenosis. No central canal stenosis.  L3-L4: Broad-based disc bulge. Mild bilateral facet arthropathy. Mild spinal stenosis. No evidence of neural foraminal stenosis.  L4-L5: Broad-based disc bulge with a right lateral disc osteophyte complex. Moderate bilateral facet arthropathy. Moderate spinal stenosis and bilateral lateral recess stenosis. No left foraminal stenosis. Moderate right foraminal stenosis.  L5-S1: Broad-based disc bulge  eccentric towards the left. Severe bilateral facet arthropathy with ligamentum flavum infolding. Left lateral recess stenosis. Mild spinal stenosis. Severe left foraminal stenosis. Mild right foraminal stenosis. 3 mm left facet posterior extra-spinal synovial cyst.  IMPRESSION: 1. At L4-5 there is a broad-based disc bulge with a right lateral disc osteophyte complex. Moderate bilateral facet arthropathy. Moderate spinal stenosis and bilateral lateral recess stenosis. No left foraminal stenosis. Moderate right foraminal stenosis. 2. At L5-S1 there is a broad-based disc bulge eccentric towards the left. Severe bilateral facet arthropathy with ligamentum flavum infolding. Left lateral recess stenosis. Mild spinal stenosis. Severe left foraminal stenosis. Mild right foraminal stenosis. 3 mm left facet posterior extra-spinal synovial cyst. 3. Interval resolution of large intraspinal synovial cyst at L5-S1 and a smaller intraspinal synovial cyst at L4-5.   Electronically Signed By: Elige Ko On: 02/03/2018 18:33   Lumbar Spine 01/21/2011  MRI LUMBAR SPINE WITHOUT CONTRAST l  IMPRESSION:   1. Spondylosis and degenerative disc disease with dominant findings at the L4-5 and L5-S1 levels. 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 2. 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 If procedural intervention is to be performed, careful correlation with this numbering strategy is recommended.     Objective:  VS:  HT:    WT:   BMI:     BP:(!) 146/87  HR:(!) 53bpm  TEMP:98.2 F (36.8 C)(Oral)  RESP:  Physical Exam  Ortho Exam Imaging: Xr C-arm No Report  Result Date: 06/11/2018 Please see Notes tab for imaging impression.

## 2018-06-11 NOTE — Patient Instructions (Signed)

## 2018-07-11 DIAGNOSIS — Z0000 Encounter for general adult medical examination without abnormal findings: Secondary | ICD-10-CM | POA: Diagnosis not present

## 2018-07-11 DIAGNOSIS — J302 Other seasonal allergic rhinitis: Secondary | ICD-10-CM | POA: Diagnosis not present

## 2018-07-11 DIAGNOSIS — Z23 Encounter for immunization: Secondary | ICD-10-CM | POA: Diagnosis not present

## 2018-07-11 DIAGNOSIS — I10 Essential (primary) hypertension: Secondary | ICD-10-CM | POA: Diagnosis not present

## 2018-07-11 DIAGNOSIS — E6609 Other obesity due to excess calories: Secondary | ICD-10-CM | POA: Diagnosis not present

## 2018-07-11 DIAGNOSIS — E7800 Pure hypercholesterolemia, unspecified: Secondary | ICD-10-CM | POA: Diagnosis not present

## 2018-07-11 DIAGNOSIS — M545 Low back pain: Secondary | ICD-10-CM | POA: Diagnosis not present

## 2018-09-03 ENCOUNTER — Telehealth (INDEPENDENT_AMBULATORY_CARE_PROVIDER_SITE_OTHER): Payer: Self-pay | Admitting: *Deleted

## 2018-09-10 ENCOUNTER — Telehealth (INDEPENDENT_AMBULATORY_CARE_PROVIDER_SITE_OTHER): Payer: Self-pay | Admitting: *Deleted

## 2018-09-10 ENCOUNTER — Other Ambulatory Visit (INDEPENDENT_AMBULATORY_CARE_PROVIDER_SITE_OTHER): Payer: Self-pay | Admitting: Physical Medicine and Rehabilitation

## 2018-09-10 DIAGNOSIS — M5416 Radiculopathy, lumbar region: Secondary | ICD-10-CM

## 2018-09-10 DIAGNOSIS — M48061 Spinal stenosis, lumbar region without neurogenic claudication: Secondary | ICD-10-CM

## 2018-09-10 MED ORDER — HYDROCODONE-ACETAMINOPHEN 5-325 MG PO TABS: 1.0000 | ORAL_TABLET | Freq: Three times a day (TID) | ORAL | 0 refills | Status: AC | PRN

## 2018-09-10 NOTE — Telephone Encounter (Signed)
Done, see note if needed

## 2018-09-10 NOTE — Telephone Encounter (Signed)
Yes okay for left L5 transforaminal epidural steroid injection.

## 2018-09-10 NOTE — Telephone Encounter (Signed)
Called and spoke with pt, advised per Dr. Alvester MorinNewton

## 2018-09-10 NOTE — Telephone Encounter (Signed)
Pt is scheduled 09/24/18 with driver.    Auth No / Request ID [id number] Status Auto-Approved Decision Approved Effective Date 09/12/2018 Expiration Date 10/27/2018

## 2018-09-10 NOTE — Progress Notes (Signed)
Recurrent left radiculopathy with L5 foraminal stenosis and arthropathy. PT [facility] and short course hydrocodone.  Patient's 12 month [place] CSRS database history was reviewed and no inappropriate medication refills noted.

## 2018-09-24 ENCOUNTER — Encounter (INDEPENDENT_AMBULATORY_CARE_PROVIDER_SITE_OTHER): Payer: Self-pay | Admitting: Physical Medicine and Rehabilitation

## 2018-09-24 ENCOUNTER — Ambulatory Visit (INDEPENDENT_AMBULATORY_CARE_PROVIDER_SITE_OTHER): Payer: Medicare HMO | Admitting: Physical Medicine and Rehabilitation

## 2018-09-24 ENCOUNTER — Ambulatory Visit (INDEPENDENT_AMBULATORY_CARE_PROVIDER_SITE_OTHER): Payer: Self-pay

## 2018-09-24 VITALS — BP 148/79 | HR 51 | Temp 97.5°F

## 2018-09-24 DIAGNOSIS — M5416 Radiculopathy, lumbar region: Secondary | ICD-10-CM

## 2018-09-24 DIAGNOSIS — M48061 Spinal stenosis, lumbar region without neurogenic claudication: Secondary | ICD-10-CM | POA: Insufficient documentation

## 2018-09-24 DIAGNOSIS — M5116 Intervertebral disc disorders with radiculopathy, lumbar region: Secondary | ICD-10-CM

## 2018-09-24 MED ORDER — BETAMETHASONE SOD PHOS & ACET 6 (3-3) MG/ML IJ SUSP
12.0000 mg | Freq: Once | INTRAMUSCULAR | Status: AC
  Administered 2018-09-24: 12 mg

## 2018-09-24 NOTE — Patient Instructions (Signed)

## 2018-09-24 NOTE — Progress Notes (Signed)
Ricardo Stewart - 73 y.o. male MRN [id number]  Date of birth: [birth year]  Office Visit Note: Visit Date: 09/24/2018 PCP: Sigmund Hazel, MD Referred by: Sigmund Hazel, MD  Subjective: Chief Complaint  Patient presents with  . Lower Back - Pain  . Left Leg - Pain   HPI:  Ricardo Stewart is a 73 y.o. male who comes in today For planned repeat L5 transforaminal epidural steroid injection on the left.  Patient got quite a bit of relief from prior injection in September but it was not long lived.  He blames some of the longevity on increasing activity because they have moved at this point and they moved from a bigger house to a smaller house and there was a lot of boxes.  He reports even though he got help during this he did move a lot of boxes with twisting and bending and stooping and this is really exacerbated symptoms.  We are updating MRI which is reviewed again below.  He has foraminal narrowing at L5.  He has had prior history of facet joint cyst.  He does have tight foramen on the left at L5 some changes on the right at L4.  We will see how he does with repeat injection he is currently undergoing physical [facility] at [facility].  ROS Otherwise per HPI.  Assessment & Plan: Visit Diagnoses:  1. Lumbar radiculopathy   2. Foraminal stenosis of lumbar region   3. Radiculopathy due to lumbar intervertebral disc disorder     Plan: No additional findings.   Meds & Orders:  Meds ordered this encounter  Medications  . betamethasone acetate-betamethasone sodium phosphate (CELESTONE) injection 12 mg    Orders Placed This Encounter  Procedures  . XR C-ARM NO REPORT  . Epidural Steroid injection    Follow-up: Return if symptoms worsen or fail to improve.   Procedures: No procedures performed  Lumbosacral Transforaminal Epidural Steroid Injection - Sub-Pedicular Approach with Fluoroscopic Guidance  [name]      Date of Birth: July 01, 1946 MRN: [id number] PCP: Sigmund Hazel,  MD      Visit Date: 09/24/2018   Universal Protocol:    Date/Time: 09/24/2018  Consent Given By: the patient  Position: PRONE  Additional Comments: Vital signs were monitored before and after the procedure. Patient was prepped and draped in the usual sterile fashion. The correct patient, procedure, and site was verified.   Injection Procedure Details:  Procedure Site One Meds Administered:  Meds ordered this encounter  Medications  . betamethasone acetate-betamethasone sodium phosphate (CELESTONE) injection 12 mg    Laterality: Left  Location/Site:  L5-S1  Needle size: 22 G  Needle type: Spinal  Needle Placement: Transforaminal  Findings:    -Comments: Excellent flow of contrast along the nerve and into the epidural space.  Procedure Details: After squaring off the end-plates to get a true AP view, the C-arm was positioned so that an oblique view of the foramen as noted above was visualized. The target area is just inferior to the "nose of the scotty dog" or sub pedicular. The soft tissues overlying this structure were infiltrated with 2-3 ml. of 1% Lidocaine without Epinephrine.  The spinal needle was inserted toward the target using a "trajectory" view along the fluoroscope beam.  Under AP and lateral visualization, the needle was advanced so it did not puncture dura and was located close the 6 O'Clock position of the pedical in AP tracterory. Biplanar projections were used to confirm position. Aspiration was confirmed  to be negative for CSF and/or blood. A 1-2 ml. volume of Isovue-250 was injected and flow of contrast was noted at each level. Radiographs were obtained for documentation purposes.   After attaining the desired flow of contrast documented above, a 0.5 to 1.0 ml test dose of 0.25% Marcaine was injected into each respective transforaminal space.  The patient was observed for 90 seconds post injection.  After no sensory deficits were reported, and normal  lower extremity motor function was noted,   the above injectate was administered so that equal amounts of the injectate were placed at each foramen (level) into the transforaminal epidural space.   Additional Comments:  The patient tolerated the procedure well Dressing: Band-Aid    Post-procedure details: Patient was observed during the procedure. Post-procedure instructions were reviewed.  Patient left the clinic in stable condition.     Clinical History: MRI LUMBAR SPINE WITHOUT CONTRAST  TECHNIQUE: Multiplanar, multisequence MR imaging of the lumbar spine was performed. No intravenous contrast was administered.  COMPARISON: 01/20/2011  FINDINGS: Segmentation: Standard.  Alignment: Physiologic.  Vertebrae: No fracture, evidence of discitis, or bone lesion.  Conus medullaris and cauda equina: Conus extends to the L1 level. Conus and cauda equina appear normal.  Paraspinal and other soft tissues: No acute paraspinal abnormality.  Disc levels:  Disc spaces: Severe degenerative disc disease with disc height loss at L4-5. Mild degenerative disc disease with mild disc height loss at T11-12 and T12-L1. Disc desiccation at L5-S1 and L3-4.  T12-L1: Small right paracentral disc protrusion. No evidence of neural foraminal stenosis. No central canal stenosis.  L1-L2: No significant disc bulge. No evidence of neural foraminal stenosis. No central canal stenosis.  L2-L3: No significant disc bulge. No evidence of neural foraminal stenosis. No central canal stenosis.  L3-L4: Broad-based disc bulge. Mild bilateral facet arthropathy. Mild spinal stenosis. No evidence of neural foraminal stenosis.  L4-L5: Broad-based disc bulge with a right lateral disc osteophyte complex. Moderate bilateral facet arthropathy. Moderate spinal stenosis and bilateral lateral recess stenosis. No left foraminal stenosis. Moderate right foraminal stenosis.  L5-S1: Broad-based disc  bulge eccentric towards the left. Severe bilateral facet arthropathy with ligamentum flavum infolding. Left lateral recess stenosis. Mild spinal stenosis. Severe left foraminal stenosis. Mild right foraminal stenosis. 3 mm left facet posterior extra-spinal synovial cyst.  IMPRESSION: 1. At L4-5 there is a broad-based disc bulge with a right lateral disc osteophyte complex. Moderate bilateral facet arthropathy. Moderate spinal stenosis and bilateral lateral recess stenosis. No left foraminal stenosis. Moderate right foraminal stenosis. 2. At L5-S1 there is a broad-based disc bulge eccentric towards the left. Severe bilateral facet arthropathy with ligamentum flavum infolding. Left lateral recess stenosis. Mild spinal stenosis. Severe left foraminal stenosis. Mild right foraminal stenosis. 3 mm left facet posterior extra-spinal synovial cyst. 3. Interval resolution of large intraspinal synovial cyst at L5-S1 and a smaller intraspinal synovial cyst at L4-5.   Electronically Signed By: Elige KoHetal Patel On: 02/03/2018 18:33   Lumbar Spine 01/21/2011  MRI LUMBAR SPINE WITHOUT CONTRAST l  IMPRESSION:   1. Spondylosis and degenerative disc disease with dominant findings at the L4-5 and L5-S1 levels. 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 2. 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 If procedural intervention is to be performed, careful correlation with this numbering strategy is recommended.     Objective:  VS:  HT:    WT:  BMI:     BP:(!) 148/79  HR:(!) 51bpm  TEMP:(!) 97.5 F (36.4 C)(Oral)  RESP:  Physical Exam  Ortho Exam Imaging: Xr C-arm No Report  Result Date: 09/24/2018 Please see Notes tab for imaging impression.

## 2018-09-24 NOTE — Progress Notes (Signed)
 .  Numeric Pain Rating Scale and Functional Assessment Average Pain 8   In the last MONTH (on 0-10 scale) has pain interfered with the following?  1. General activity like being  able to carry out your everyday physical activities such as walking, climbing stairs, carrying groceries, or moving a chair?  Rating(7)   +Driver, -BT, -Dye Allergies.  

## 2018-09-24 NOTE — Procedures (Signed)
Lumbosacral Transforaminal Epidural Steroid Injection - Sub-Pedicular Approach with Fluoroscopic Guidance  [name]      Date of Birth: [birth year] MRN: [id number] PCP: Sigmund HazelMiller, Lisa, MD      Visit Date: 09/24/2018   Universal Protocol:    Date/Time: 09/24/2018  Consent Given By: the patient  Position: PRONE  Additional Comments: Vital signs were monitored before and after the procedure. Patient was prepped and draped in the usual sterile fashion. The correct patient, procedure, and site was verified.   Injection Procedure Details:  Procedure Site One Meds Administered:  Meds ordered this encounter  Medications  . betamethasone acetate-betamethasone sodium phosphate (CELESTONE) injection 12 mg    Laterality: Left  Location/Site:  L5-S1  Needle size: 22 G  Needle type: Spinal  Needle Placement: Transforaminal  Findings:    -Comments: Excellent flow of contrast along the nerve and into the epidural space.  Procedure Details: After squaring off the end-plates to get a true AP view, the C-arm was positioned so that an oblique view of the foramen as noted above was visualized. The target area is just inferior to the "nose of the scotty dog" or sub pedicular. The soft tissues overlying this structure were infiltrated with 2-3 ml. of 1% Lidocaine without Epinephrine.  The spinal needle was inserted toward the target using a "trajectory" view along the fluoroscope beam.  Under AP and lateral visualization, the needle was advanced so it did not puncture dura and was located close the 6 O'Clock position of the pedical in AP tracterory. Biplanar projections were used to confirm position. Aspiration was confirmed to be negative for CSF and/or blood. A 1-2 ml. volume of Isovue-250 was injected and flow of contrast was noted at each level. Radiographs were obtained for documentation purposes.   After attaining the desired flow of contrast documented above, a 0.5 to 1.0 ml  test dose of 0.25% Marcaine was injected into each respective transforaminal space.  The patient was observed for 90 seconds post injection.  After no sensory deficits were reported, and normal lower extremity motor function was noted,   the above injectate was administered so that equal amounts of the injectate were placed at each foramen (level) into the transforaminal epidural space.   Additional Comments:  The patient tolerated the procedure well Dressing: Band-Aid    Post-procedure details: Patient was observed during the procedure. Post-procedure instructions were reviewed.  Patient left the clinic in stable condition.

## 2018-09-26 ENCOUNTER — Ambulatory Visit: Payer: Medicare HMO | Attending: Physical Medicine and Rehabilitation | Admitting: Physical Therapy

## 2018-09-26 ENCOUNTER — Other Ambulatory Visit: Payer: Self-pay

## 2018-09-26 ENCOUNTER — Encounter: Payer: Self-pay | Admitting: Physical Therapy

## 2018-09-26 DIAGNOSIS — M5442 Lumbago with sciatica, left side: Secondary | ICD-10-CM | POA: Diagnosis not present

## 2018-09-26 DIAGNOSIS — M25551 Pain in right hip: Secondary | ICD-10-CM | POA: Insufficient documentation

## 2018-09-26 DIAGNOSIS — M25552 Pain in left hip: Secondary | ICD-10-CM | POA: Insufficient documentation

## 2018-09-26 NOTE — Therapy (Signed)
[facility] [place] [address] [place], [place], [address] Phone: [phone]   Fax:  [phone]  Physical Therapy Evaluation  Patient Details  Name: Ricardo Stewart MRN: [id number] Date of Birth: 05/14/1946 Referring Provider (PT): Naaman Plummer   Encounter Date: 09/26/2018  PT End of Session - 09/26/18 1027    Visit Number  1    Date for PT Re-Evaluation  11/25/18    PT Start Time  0925    PT Stop Time  1015    PT Time Calculation (min)  50 min    Activity Tolerance  Patient tolerated treatment well    Behavior During Therapy  [facility] for tasks assessed/performed       History reviewed. No pertinent past medical history.  History reviewed. No pertinent surgical history.  There were no vitals filed for this visit.   Subjective Assessment - 09/26/18 0929    Subjective  Patient reports that he started moving out of his house, he did a lot of lifting and moving of boxes.  He reports that he started having more pain inthe low back and in the left HS origin.  MRI showed foraminal stenosis and possible left ischial bursitis  Reports that he has had two injections that helped some.    Limitations  Lifting;Walking;House hold activities    Patient Stated Goals  have less pain, lift things around the house, return to walking for exercise (3-4 miles)    Currently in Pain?  Yes    Pain Score  1     Pain Location  Back   left HS origin   Pain Orientation  Lower;Left    Pain Descriptors / Indicators  Aching;Tightness;Sore    Pain Type  Acute pain    Pain Onset  More than a month ago    Pain Frequency  Constant    Aggravating Factors   lifting, worse in the morning, carrying items, twisting pain up to 8/10    Pain Relieving Factors  Advil (6-8 a day), rest, pain cn be 0/10, laying flat on back    Effect of Pain on Daily Activities  difficulty walking, lifting carryint         [facility] PT Assessment - 09/26/18 0001      Assessment   Medical Diagnosis  lumbar radiculopathy, foraminal stenosis, left ischial bursitis    Referring Provider (PT)  Naaman Plummer    Onset Date/Surgical Date  08/26/18    Prior Therapy  yes about a year ago for right HS strain      Precautions   Precautions  None      Balance Screen   Has the patient fallen in the past 6 months  No    Has the patient had a decrease in activity level because of a fear of falling?   No    Is the patient reluctant to leave their home because of a fear of falling?   No      Home Environment   Additional Comments  has stairs, does not have to do yardwork      Prior Function   Level of Independence  Independent    Vocation  Retired    Leisure  walks 3 miles a day      ROM / Strength   AROM / PROM / Strength  AROM;Strength      AROM   Overall AROM Comments  Lumbar ROM decreased 50% with some pain in the low back  and the left HS area      Strength   Overall Strength Comments  4+/5 mild pain in the left HS with resisted knee flexion and hip extnesion      Flexibility   Hamstrings  very tight 40 degrees SLR    Quadriceps  very tight >12" heel from buttock    ITB  tight    Piriformis  tight      Palpation   Palpation comment  very tender in the left ischial tuberosity, tight in the low back mms      Ambulation/Gait   Gait Comments  mild antalgic gait on the left                Objective measurements completed on examination: See above findings.              PT Education - 09/26/18 1023    Education Details  HEP Wms flexion    Person(s) Educated  Patient    Methods  Demonstration;Handout;Explanation    Comprehension  Verbalized understanding       PT Short Term Goals - 09/26/18 1151      PT SHORT TERM GOAL #1   Title  independent with HEP    Time  2    Period  Weeks    Status  New        PT Long Term Goals - 09/26/18 1151      PT LONG TERM GOAL #1   Title  report no pain when getting up in the AM    Time  8     Period  Weeks    Status  New      PT LONG TERM GOAL #2   Title  walk without issue his 3-4 miles    Time  8    Period  Weeks    Status  New      PT LONG TERM GOAL #3   Title  increase SLR to 60 degrees    Time  8    Period  Weeks    Status  New      PT LONG TERM GOAL #4   Title  understand posture and body mechanics    Time  8    Period  Weeks    Status  New             Plan - 09/26/18 1028    Clinical Impression Statement  Patient reports that about 6 months ago he moved, reports that he did a lot of lifting and carrying items.  He reports that he started having low back pain and left buttock and HS pain.  MRI showed foraminal stenosis, arthritis and ischial bursitis.  He is very tight in the LE, especially the HS and the quads.  Has a slight diastisis recti    Clinical Presentation  Stable    Clinical Decision Making  Low    Rehab Potential  Good    PT Frequency  2x / week    PT Duration  8 weeks    PT Treatment/Interventions  ADLs/Self Care Home Management;Cryotherapy;Electrical Stimulation;Moist Heat;Iontophoresis 4mg /ml Dexamethasone;Gait training;Therapeutic exercise;Therapeutic activities;Patient/family education;Manual techniques;Traction    PT Next Visit Plan  Will try traction, iontophoresis, needs flexibility and core work    [employer] and Agree with Plan of Care  Patient       Patient will benefit from skilled therapeutic intervention in order to improve the following deficits and impairments:  Abnormal gait, Decreased mobility, Impaired flexibility,  Improper body mechanics, Pain, Increased muscle spasms, Increased fascial restricitons, Difficulty walking, Decreased range of motion  Visit Diagnosis: Acute bilateral low back pain with left-sided sciatica - Plan: PT plan of care cert/re-cert  Pain in left hip - Plan: PT plan of care cert/re-cert     Problem List Patient Active Problem List   Diagnosis Date Noted  . Lumbar radiculopathy 09/24/2018  .  Foraminal stenosis of lumbar region 09/24/2018  . Spondylosis without myelopathy or radiculopathy, lumbar region 08/29/2016  . Chronic left-sided low back pain without sciatica 08/29/2016    Jearld LeschALBRIGHT,Eoghan Belcher W., PT 09/26/2018, 11:54 AM  [facility] [place] [facility] [address] [place], [place], [address] Phone: [phone]   Fax:  [phone]  Name: Fanny BienCraig [place]burg MRN: [id number] Date of Birth: 10-17-1945

## 2018-10-03 ENCOUNTER — Ambulatory Visit: Payer: Medicare HMO | Admitting: Physical Therapy

## 2018-10-03 ENCOUNTER — Encounter: Payer: Self-pay | Admitting: Physical Therapy

## 2018-10-03 DIAGNOSIS — M25552 Pain in left hip: Secondary | ICD-10-CM | POA: Diagnosis not present

## 2018-10-03 DIAGNOSIS — M5442 Lumbago with sciatica, left side: Secondary | ICD-10-CM

## 2018-10-03 DIAGNOSIS — M25551 Pain in right hip: Secondary | ICD-10-CM | POA: Diagnosis not present

## 2018-10-03 NOTE — Therapy (Signed)
[place], [place], [address] Phone: [phone]   Fax:  [phone]  Physical Therapy Treatment  Patient Details  Name: Ricardo Stewart MRN: [id number] Date of Birth: 1946/05/02 Referring Provider (PT): Laurence Spates   Encounter Date: 10/03/2018  PT End of Session - 10/03/18 1518    Visit Number  2    Date for PT Re-Evaluation  11/25/18    PT Start Time  1440    PT Stop Time  1530    PT Time Calculation (min)  50 min    Activity Tolerance  Patient tolerated treatment well    Behavior During Therapy  [facility] for tasks assessed/performed       History reviewed. No pertinent past medical history.  History reviewed. No pertinent surgical history.  There were no vitals filed for this visit.  Subjective Assessment - 10/03/18 1450    Subjective  Patient reports that he has been unpacking and thinks he overdid it to day    Currently in Pain?  Yes    Pain Score  5     Pain Location  Back    Pain Orientation  Lower    Pain Descriptors / Indicators  Aching    Aggravating Factors   bending, unpacking cumulative effects pain up to 7/10                       [facility] Adult PT Treatment/Exercise - 10/03/18 0001      Exercises   Exercises  Lumbar      Lumbar Exercises: Stretches   Passive Hamstring Stretch  4 reps;20 seconds    Piriformis Stretch  4 reps;20 seconds      Lumbar Exercises: Aerobic   Elliptical  R=7 I=7 x 2 minutes, needs a lot of cues, very difficult for him    Nustep  level 4 x 5 minutes      Lumbar Exercises: Supine   Other Supine Lumbar Exercises  reviewed Wms flexion and performed      Modalities   Modalities  Traction;Iontophoresis      Iontophoresis   Type of Iontophoresis  Dexamethasone    Location  left HS origin    Dose  80m    Time  4 hour patch      Traction   Type of Traction  Lumbar    Max (lbs)  85    Time  15               PT Short  Term Goals - 10/03/18 1519      PT SHORT TERM GOAL #1   Title  independent with HEP    Status  Partially Met        PT Long Term Goals - 09/26/18 1151      PT LONG TERM GOAL #1   Title  report no pain when getting up in the AM    Time  8    Period  Weeks    Status  New      PT LONG TERM GOAL #2   Title  walk without issue his 3-4 miles    Time  8    Period  Weeks    Status  New      PT LONG TERM GOAL #3   Title  increase SLR to 60 degrees    Time  8    Period  Weeks    Status  New  PT LONG TERM GOAL #4   Title  understand posture and body mechanics    Time  8    Period  Weeks    Status  New            Plan - 10/03/18 1518    Clinical Impression Statement  Patient reports that he has been trying the stretches, he is still very tight, had difficulty on the elliptical needing a lot of cues for the motion and his posture.  Him bending and lifting and unpacking at home has caused him to have increaed pain today    PT Next Visit Plan  see how traction and ionto did    Consulted and Agree with Plan of Care  Patient       Patient will benefit from skilled therapeutic intervention in order to improve the following deficits and impairments:  Abnormal gait, Decreased mobility, Impaired flexibility, Improper body mechanics, Pain, Increased muscle spasms, Increased fascial restricitons, Difficulty walking, Decreased range of motion  Visit Diagnosis: Acute bilateral low back pain with left-sided sciatica  Pain in left hip     Problem List Patient Active Problem List   Diagnosis Date Noted  . Lumbar radiculopathy 09/24/2018  . Foraminal stenosis of lumbar region 09/24/2018  . Spondylosis without myelopathy or radiculopathy, lumbar region 08/29/2016  . Chronic left-sided low back pain without sciatica 08/29/2016    Sumner Boast., PT 10/03/2018, 3:20 PM  [place] [address] [place] [address]  [place], [place], [address] Phone: [phone]   Fax:  [phone]  Name: Abundio Teuscher MRN: [id number] Date of Birth: Mar 21, 1946

## 2018-10-05 ENCOUNTER — Encounter: Payer: Self-pay | Admitting: Physical Therapy

## 2018-10-05 ENCOUNTER — Ambulatory Visit: Payer: Medicare HMO | Admitting: Physical Therapy

## 2018-10-05 DIAGNOSIS — M5442 Lumbago with sciatica, left side: Secondary | ICD-10-CM | POA: Diagnosis not present

## 2018-10-05 DIAGNOSIS — M25552 Pain in left hip: Secondary | ICD-10-CM

## 2018-10-05 DIAGNOSIS — M25551 Pain in right hip: Secondary | ICD-10-CM | POA: Diagnosis not present

## 2018-10-05 NOTE — Therapy (Signed)
[facility] [place] [address] [place], [place], [address] Phone: [phone]   Fax:  [phone]  Physical Therapy Treatment  Patient Details  Name: Ricardo Stewart MRN: [id number] Date of Birth: April 30, 1946 Referring Provider (PT): Naaman Plummer   Encounter Date: 10/05/2018  PT End of Session - 10/05/18 0914    Visit Number  3    Date for PT Re-Evaluation  11/25/18    PT Start Time  0839    PT Stop Time  0940    PT Time Calculation (min)  61 min    Activity Tolerance  Patient tolerated treatment well    Behavior During Therapy  [facility] for tasks assessed/performed       History reviewed. No pertinent past medical history.  History reviewed. No pertinent surgical history.  There were no vitals filed for this visit.  Subjective Assessment - 10/05/18 0848    Subjective  Patient reports his mid low back has been tight and very sore since th traction    Currently in Pain?  Yes    Pain Score  5     Pain Location  Back    Pain Orientation  Lower    Aggravating Factors   the traction hurt                       [facility] Adult PT Treatment/Exercise - 10/05/18 0001      Lumbar Exercises: Stretches   Passive Hamstring Stretch  4 reps;20 seconds    Piriformis Stretch  4 reps;20 seconds      Lumbar Exercises: Aerobic   Elliptical  R=7 I=7 x 2 minutes, needs a lot of cues, did better today    Nustep  level 4 x 5 minutes      Lumbar Exercises: Machines for Strengthening   Other Lumbar Machine Exercise  seated row 25#, lats 25#    Other Lumbar Machine Exercise  15# AR press, 10# stratight arm pull for core      Moist Heat Therapy   Number Minutes Moist Heat  15 Minutes    Moist Heat Location  Lumbar Spine      Electrical Stimulation   Electrical Stimulation Location  lumbar    Electrical Stimulation Action  IFC    Electrical Stimulation Parameters  supine    Electrical Stimulation Goals  Pain      Traction   Type of Traction  Lumbar    Min (lbs)  65    Max (lbs)  85    Hold Time  60    Rest Time  20    Time  15               PT Short Term Goals - 10/05/18 0916      PT SHORT TERM GOAL #1   Title  independent with HEP    Status  Achieved        PT Long Term Goals - 09/26/18 1151      PT LONG TERM GOAL #1   Title  report no pain when getting up in the AM    Time  8    Period  Weeks    Status  New      PT LONG TERM GOAL #2   Title  walk without issue his 3-4 miles    Time  8    Period  Weeks    Status  New      PT  LONG TERM GOAL #3   Title  increase SLR to 60 degrees    Time  8    Period  Weeks    Status  New      PT LONG TERM GOAL #4   Title  understand posture and body mechanics    Time  8    Period  Weeks    Status  New            Plan - 10/05/18 0915    Clinical Impression Statement  Patient with soreness after the traction, so we added the estim today to help this.  His flexibility is better.  Some pain with the AR press    PT Next Visit Plan  see if this helps his pain    Consulted and Agree with Plan of Care  Patient       Patient will benefit from skilled therapeutic intervention in order to improve the following deficits and impairments:  Abnormal gait, Decreased mobility, Impaired flexibility, Improper body mechanics, Pain, Increased muscle spasms, Increased fascial restricitons, Difficulty walking, Decreased range of motion  Visit Diagnosis: Acute bilateral low back pain with left-sided sciatica  Pain in left hip     Problem List Patient Active Problem List   Diagnosis Date Noted  . Lumbar radiculopathy 09/24/2018  . Foraminal stenosis of lumbar region 09/24/2018  . Spondylosis without myelopathy or radiculopathy, lumbar region 08/29/2016  . Chronic left-sided low back pain without sciatica 08/29/2016    Jearld LeschALBRIGHT,Chesney Klimaszewski W., PT 10/05/2018, 9:16 AM  [facility] [place] [address] [place], [place], [address] Phone: [phone]   Fax:  [phone]  Name: Ricardo Stewart MRN: [id number] Date of Birth: 1946-04-19

## 2018-10-10 ENCOUNTER — Ambulatory Visit: Payer: Medicare HMO | Admitting: Physical Therapy

## 2018-10-10 ENCOUNTER — Encounter: Payer: Self-pay | Admitting: Physical Therapy

## 2018-10-10 DIAGNOSIS — M5442 Lumbago with sciatica, left side: Secondary | ICD-10-CM | POA: Diagnosis not present

## 2018-10-10 DIAGNOSIS — M25552 Pain in left hip: Secondary | ICD-10-CM | POA: Diagnosis not present

## 2018-10-10 DIAGNOSIS — M25551 Pain in right hip: Secondary | ICD-10-CM | POA: Diagnosis not present

## 2018-10-10 NOTE — Therapy (Signed)
[facility] [place] [address] [place], [place], [address] Phone: [phone]   Fax:  [phone]  Physical Therapy Treatment  Patient Details  Name: Ricardo Stewart MRN: [id number] Date of Birth: 07-27-1946 Referring Provider (PT): Naaman Plummer   Encounter Date: 10/10/2018  PT End of Session - 10/10/18 1007    Visit Number  4    Date for PT Re-Evaluation  11/25/18    PT Start Time  0928    PT Stop Time  1030    PT Time Calculation (min)  62 min    Activity Tolerance  Patient tolerated treatment well    Behavior During Therapy  [facility] for tasks assessed/performed       History reviewed. No pertinent past medical history.  History reviewed. No pertinent surgical history.  There were no vitals filed for this visit.  Subjective Assessment - 10/10/18 0936    Subjective  Reports that he felt a lot better after last time, "stiff now"    Currently in Pain?  Yes    Pain Score  4     Pain Location  Back    Pain Orientation  Lower    Aggravating Factors   I think the last treatment really helped                       [facility] Adult PT Treatment/Exercise - 10/10/18 0001      Lumbar Exercises: Stretches   Passive Hamstring Stretch  4 reps;20 seconds    Piriformis Stretch  4 reps;20 seconds      Lumbar Exercises: Aerobic   Elliptical  R=7 I=7 x 2 minutes, needs a lot of cues, did better today    Nustep  level 4 x 5 minutes    Other Aerobic Exercise  resisted gait all directions'      Lumbar Exercises: Machines for Strengthening   Other Lumbar Machine Exercise  seated row 25#, lats 25#    Other Lumbar Machine Exercise  15# AR press, 10# stratight arm pull for core      Lumbar Exercises: Supine   Other Supine Lumbar Exercises  feet on ball K2C, trunk rotation , small bridge and isometric abdominals      Moist Heat Therapy   Number Minutes Moist Heat  15 Minutes    Moist Heat Location  Lumbar Spine      Electrical Stimulation   Electrical Stimulation Location  lumbar    Electrical Stimulation Action  FC    Electrical Stimulation Parameters  supine    Electrical Stimulation Goals  Pain      Traction   Type of Traction  Lumbar    Min (lbs)  65    Max (lbs)  85    Hold Time  60    Rest Time  20    Time  15               PT Short Term Goals - 10/05/18 0916      PT SHORT TERM GOAL #1   Title  independent with HEP    Status  Achieved        PT Long Term Goals - 10/10/18 1009      PT LONG TERM GOAL #1   Title  report no pain when getting up in the AM    Status  On-going      PT LONG TERM GOAL #2   Title  walk without issue  his 3-4 miles    Status  On-going      PT LONG TERM GOAL #3   Title  increase SLR to 60 degrees    Status  On-going      PT LONG TERM GOAL #4   Title  understand posture and body mechanics    Status  On-going            Plan - 10/10/18 1007    Clinical Impression Statement  Patient has a definite limp today, reports that he is stiff from sitting, once he gets moving her is better, he needs a lot of cues to do exercises correctly.  Tends to rush and not hold the correct form or posture. he remains very tight in the HS    PT Next Visit Plan  reports last treatment was good, so we may continue to add exercises    Consulted and Agree with Plan of Care  Patient       Patient will benefit from skilled therapeutic intervention in order to improve the following deficits and impairments:  Abnormal gait, Decreased mobility, Impaired flexibility, Improper body mechanics, Pain, Increased muscle spasms, Increased fascial restricitons, Difficulty walking, Decreased range of motion  Visit Diagnosis: Acute bilateral low back pain with left-sided sciatica  Pain in left hip     Problem List Patient Active Problem List   Diagnosis Date Noted  . Lumbar radiculopathy 09/24/2018  . Foraminal stenosis of lumbar region 09/24/2018  . Spondylosis  without myelopathy or radiculopathy, lumbar region 08/29/2016  . Chronic left-sided low back pain without sciatica 08/29/2016    Jearld Lesch., PT 10/10/2018, 10:10 AM  [facility] [place] [address] [facility] [address] [place], [place], [address] Phone: [phone]   Fax:  [phone]  Name: Atticus Xayavong MRN: [id number] Date of Birth: 05-31-1946

## 2018-10-12 ENCOUNTER — Encounter: Payer: Self-pay | Admitting: Physical Therapy

## 2018-10-12 ENCOUNTER — Ambulatory Visit: Payer: Medicare HMO | Admitting: Physical Therapy

## 2018-10-12 DIAGNOSIS — M5442 Lumbago with sciatica, left side: Secondary | ICD-10-CM

## 2018-10-12 DIAGNOSIS — M25552 Pain in left hip: Secondary | ICD-10-CM

## 2018-10-12 DIAGNOSIS — M25551 Pain in right hip: Secondary | ICD-10-CM

## 2018-10-12 NOTE — Therapy (Signed)
[place], [place], [address] Phone: [phone]   Fax:  [phone]  Physical Therapy Treatment  Patient Details  Name: Ricardo Stewart MRN: [id number] Date of Birth: 04/24/1946 Referring Provider (PT): Laurence Spates   Encounter Date: 10/12/2018  PT End of Session - 10/12/18 0956    Visit Number  5    Date for PT Re-Evaluation  11/25/18    PT Start Time  0930    PT Stop Time  1026    PT Time Calculation (min)  56 min    Activity Tolerance  Patient tolerated treatment well    Behavior During Therapy  [facility] for tasks assessed/performed       History reviewed. No pertinent past medical history.  History reviewed. No pertinent surgical history.  There were no vitals filed for this visit.  Subjective Assessment - 10/12/18 0933    Subjective  Pt reports some discomfort in his L hip    Currently in Pain?  Yes    Pain Score  2     Pain Location  Hip    Pain Orientation  Left    Pain Descriptors / Indicators  Nagging                       [facility] Adult PT Treatment/Exercise - 10/12/18 0001      Lumbar Exercises: Aerobic   Elliptical  R=7 I=7 x 3 minutes, needs a lot of cues, did better today    Nustep  level 5 x 5 minutes      Lumbar Exercises: Machines for Strengthening   Other Lumbar Machine Exercise  seated row 25#, lats 25#    Other Lumbar Machine Exercise  20# AR press, 20# stratight arm pull for core      Moist Heat Therapy   Number Minutes Moist Heat  15 Minutes    Moist Heat Location  Lumbar Spine      Electrical Stimulation   Electrical Stimulation Location  lumbar    Electrical Stimulation Action  Ifc    Electrical Stimulation Parameters  supine    Electrical Stimulation Goals  Pain      Traction   Type of Traction  Lumbar    Min (lbs)  65    Max (lbs)  85    Hold Time  60    Rest Time  20    Time  15               PT Short Term Goals - 10/05/18  0916      PT SHORT TERM GOAL #1   Title  independent with HEP    Status  Achieved        PT Long Term Goals - 10/12/18 0959      PT LONG TERM GOAL #1   Title  report no pain when getting up in the AM    Status  Partially Met            Plan - 10/12/18 0956    Clinical Impression Statement  Pt enters clinic reporting some L hip pain and discomfort from last PT session so withheld resisted gait. Increase weight tolerated with straight arm pull downs and anti rotational presses. Some core weakness exposes when preventing L rotation. Postural cues needed with seated rows to keep shoulders back.     PT Frequency  2x / week    PT Duration  8 weeks  PT Treatment/Interventions  ADLs/Self Care Home Management;Cryotherapy;Electrical Stimulation;Moist Heat;Iontophoresis '4mg'$ /ml Dexamethasone;Gait training;Therapeutic exercise;Therapeutic activities;Patient/family education;Manual techniques;Traction    PT Next Visit Plan  reports last treatment was good, so we may continue to add exercises, assess hip discomfort       Patient will benefit from skilled therapeutic intervention in order to improve the following deficits and impairments:  Abnormal gait, Decreased mobility, Impaired flexibility, Improper body mechanics, Pain, Increased muscle spasms, Increased fascial restricitons, Difficulty walking, Decreased range of motion  Visit Diagnosis: Acute bilateral low back pain with left-sided sciatica  Pain in left hip  Pain in right hip     Problem List Patient Active Problem List   Diagnosis Date Noted  . Lumbar radiculopathy 09/24/2018  . Foraminal stenosis of lumbar region 09/24/2018  . Spondylosis without myelopathy or radiculopathy, lumbar region 08/29/2016  . Chronic left-sided low back pain without sciatica 08/29/2016    Scot Jun, PTA 10/12/2018, 10:14 AM  [place],  [place], [address] Phone: [phone]   Fax:  [phone]  Name: Ricardo Stewart MRN: [id number] Date of Birth: 25-Aug-1946

## 2018-10-17 ENCOUNTER — Ambulatory Visit: Payer: Medicare HMO | Attending: Physical Medicine and Rehabilitation | Admitting: Physical Therapy

## 2018-10-17 ENCOUNTER — Encounter: Payer: Self-pay | Admitting: Physical Therapy

## 2018-10-17 DIAGNOSIS — M25552 Pain in left hip: Secondary | ICD-10-CM | POA: Insufficient documentation

## 2018-10-17 DIAGNOSIS — M5442 Lumbago with sciatica, left side: Secondary | ICD-10-CM | POA: Insufficient documentation

## 2018-10-17 DIAGNOSIS — M25551 Pain in right hip: Secondary | ICD-10-CM | POA: Insufficient documentation

## 2018-10-17 NOTE — Therapy (Signed)
[place], [place], [address] Phone: [phone]   Fax:  [phone]  Physical Therapy Treatment  Patient Details  Name: Ricardo Stewart MRN: [id number] Date of Birth: 1945/11/10 Referring Provider (PT): Laurence Spates   Encounter Date: 10/17/2018  PT End of Session - 10/17/18 1001    Visit Number  6    Date for PT Re-Evaluation  11/25/18    PT Start Time  0930    PT Stop Time  1033    PT Time Calculation (min)  63 min    Activity Tolerance  Patient tolerated treatment well    Behavior During Therapy  [facility] for tasks assessed/performed       History reviewed. No pertinent past medical history.  History reviewed. No pertinent surgical history.  There were no vitals filed for this visit.  Subjective Assessment - 10/17/18 0933    Subjective  Patient reports that he is overall having less pain, less often, still worse in the morning    Currently in Pain?  Yes    Pain Score  2     Pain Location  Buttocks    Pain Orientation  Left    Pain Descriptors / Indicators  Nagging    Aggravating Factors   morning is worse    Pain Relieving Factors  treatment seems to be helping                       [facility] Adult PT Treatment/Exercise - 10/17/18 0001      Lumbar Exercises: Stretches   Passive Hamstring Stretch  4 reps;20 seconds    Piriformis Stretch  4 reps;20 seconds      Lumbar Exercises: Aerobic   Nustep  level 5 x 5 minutes    Other Aerobic Exercise  resisted gait all directions'      Lumbar Exercises: Machines for Strengthening   Other Lumbar Machine Exercise  seated row 35#, lats 35#    Other Lumbar Machine Exercise  20# AR press, 20# stratight arm pull for core      Lumbar Exercises: Supine   Other Supine Lumbar Exercises  feet on ball K2C, trunk rotation , small bridge and isometric abdominals      Moist Heat Therapy   Number Minutes Moist Heat  15 Minutes    Moist Heat  Location  Lumbar Spine      Electrical Stimulation   Electrical Stimulation Location  lumbar    Electrical Stimulation Action  IFC    Electrical Stimulation Parameters  supine    Electrical Stimulation Goals  Pain      Traction   Type of Traction  Lumbar    Min (lbs)  65    Max (lbs)  85    Hold Time  60    Rest Time  20    Time  15             PT Education - 10/17/18 1000    Education Details  Went over gym exercises with patient gave him a handout of what we are doing with teh weights and some cues    Person(s) Educated  Patient    Methods  Explanation;Demonstration;Handout    Comprehension  Verbalized understanding;Returned demonstration;Verbal cues required;Tactile cues required       PT Short Term Goals - 10/05/18 0916      PT SHORT TERM GOAL #1   Title  independent with HEP  Status  Achieved        PT Long Term Goals - 10/17/18 1003      PT LONG TERM GOAL #1   Title  report no pain when getting up in the AM    Status  Partially Met      PT LONG TERM GOAL #2   Title  walk without issue his 3-4 miles    Status  Partially Met            Plan - 10/17/18 1002    Clinical Impression Statement  Patient overall with increaes ability and less pain, he is wanting to try to replicate some of the exercises at the gym we started that education today.    PT Next Visit Plan  continue to add core and flexibility for home and gym    Consulted and Agree with Plan of Care  Patient       Patient will benefit from skilled therapeutic intervention in order to improve the following deficits and impairments:  Abnormal gait, Decreased mobility, Impaired flexibility, Improper body mechanics, Pain, Increased muscle spasms, Increased fascial restricitons, Difficulty walking, Decreased range of motion  Visit Diagnosis: Acute bilateral low back pain with left-sided sciatica  Pain in left hip     Problem List Patient Active Problem List   Diagnosis Date Noted  .  Lumbar radiculopathy 09/24/2018  . Foraminal stenosis of lumbar region 09/24/2018  . Spondylosis without myelopathy or radiculopathy, lumbar region 08/29/2016  . Chronic left-sided low back pain without sciatica 08/29/2016    Sumner Boast., PT 10/17/2018, 10:03 AM  [place] [address] [place], [place], [address] Phone: [phone]   Fax:  [phone]  Name: Ricardo Stewart MRN: [id number] Date of Birth: [birth year]

## 2018-10-19 ENCOUNTER — Encounter: Payer: Self-pay | Admitting: Physical Therapy

## 2018-10-19 ENCOUNTER — Ambulatory Visit: Payer: Medicare HMO | Admitting: Physical Therapy

## 2018-10-19 DIAGNOSIS — M5442 Lumbago with sciatica, left side: Secondary | ICD-10-CM | POA: Diagnosis not present

## 2018-10-19 DIAGNOSIS — M25552 Pain in left hip: Secondary | ICD-10-CM

## 2018-10-19 DIAGNOSIS — M25551 Pain in right hip: Secondary | ICD-10-CM | POA: Diagnosis not present

## 2018-10-19 NOTE — Therapy (Signed)
[place], [place], [address] Phone: [phone]   Fax:  [phone]  Physical Therapy Treatment  Patient Details  Name: Ricardo Stewart MRN: [id number] Date of Birth: 12-23-1945 Referring Provider (PT): Laurence Spates   Encounter Date: 10/19/2018  PT End of Session - 10/19/18 1230    Visit Number  7    Date for PT Re-Evaluation  11/25/18    PT Start Time  0925    PT Stop Time  1030    PT Time Calculation (min)  65 min    Activity Tolerance  Patient tolerated treatment well    Behavior During Therapy  [facility] for tasks assessed/performed       History reviewed. No pertinent past medical history.  History reviewed. No pertinent surgical history.  There were no vitals filed for this visit.  Subjective Assessment - 10/19/18 0936    Subjective  Patient reports that he is very stiff this AM.  Reports that he has not gotten to the gym yet, but did lift a large limb that fell    Currently in Pain?  Yes    Pain Score  2     Pain Location  Back    Pain Orientation  Lower    Pain Descriptors / Indicators  Tightness                       [facility] Adult PT Treatment/Exercise - 10/19/18 0001      Lumbar Exercises: Stretches   Passive Hamstring Stretch  4 reps;20 seconds    Lower Trunk Rotation  4 reps;10 seconds    Piriformis Stretch  4 reps;20 seconds      Lumbar Exercises: Aerobic   Nustep  level 5 x 5 minutes    Other Aerobic Exercise  resisted gait all directions'      Lumbar Exercises: Machines for Strengthening   Other Lumbar Machine Exercise  seated row 35#, lats 35#    Other Lumbar Machine Exercise  20# AR press, 20# stratight arm pull for core, 5# hip abduction and extension      Lumbar Exercises: Supine   Other Supine Lumbar Exercises  feet on ball K2C, trunk rotation , small bridge and isometric abdominals      Moist Heat Therapy   Number Minutes Moist Heat  15 Minutes    Moist Heat Location  Lumbar Spine      Electrical Stimulation   Electrical Stimulation Location  lumbar    Electrical Stimulation Action  IFC    Electrical Stimulation Parameters  supine    Electrical Stimulation Goals  Pain      Traction   Type of Traction  Lumbar    Min (lbs)  65    Max (lbs)  85    Hold Time  60    Rest Time  20    Time  15               PT Short Term Goals - 10/05/18 0916      PT SHORT TERM GOAL #1   Title  independent with HEP    Status  Achieved        PT Long Term Goals - 10/17/18 1003      PT LONG TERM GOAL #1   Title  report no pain when getting up in the AM    Status  Partially Met      PT LONG TERM  GOAL #2   Title  walk without issue his 3-4 miles    Status  Partially Met            Plan - 10/19/18 1230    Clinical Impression Statement  Patient with definite weakness in the left hip, had difficulty with the hip abduction exercises, some pain with bridging.  He c/o stiffness and had good relief with passive piriformis stretches    PT Next Visit Plan  continue to add core and flexibility for home and gym    Consulted and Agree with Plan of Care  Patient       Patient will benefit from skilled therapeutic intervention in order to improve the following deficits and impairments:  Abnormal gait, Decreased mobility, Impaired flexibility, Improper body mechanics, Pain, Increased muscle spasms, Increased fascial restricitons, Difficulty walking, Decreased range of motion  Visit Diagnosis: Acute bilateral low back pain with left-sided sciatica  Pain in left hip     Problem List Patient Active Problem List   Diagnosis Date Noted  . Lumbar radiculopathy 09/24/2018  . Foraminal stenosis of lumbar region 09/24/2018  . Spondylosis without myelopathy or radiculopathy, lumbar region 08/29/2016  . Chronic left-sided low back pain without sciatica 08/29/2016    Sumner Boast., PT 10/19/2018, 12:31 PM  [place] [address] [place], [place], [address] Phone: [phone]   Fax:  [phone]  Name: Sandor Arboleda MRN: [id number] Date of Birth: [birth year]

## 2018-10-23 ENCOUNTER — Ambulatory Visit: Payer: Medicare HMO | Admitting: Physical Therapy

## 2018-10-31 ENCOUNTER — Ambulatory Visit: Payer: Medicare HMO | Admitting: Physical Therapy

## 2018-10-31 ENCOUNTER — Encounter: Payer: Self-pay | Admitting: Physical Therapy

## 2018-10-31 DIAGNOSIS — M25552 Pain in left hip: Secondary | ICD-10-CM | POA: Diagnosis not present

## 2018-10-31 DIAGNOSIS — M25551 Pain in right hip: Secondary | ICD-10-CM | POA: Diagnosis not present

## 2018-10-31 DIAGNOSIS — M5442 Lumbago with sciatica, left side: Secondary | ICD-10-CM

## 2018-10-31 NOTE — Therapy (Signed)
[place], [place], [address] Phone: [phone]   Fax:  [phone]  Physical Therapy Treatment  Patient Details  Name: Derrik Mceachern MRN: [id number] Date of Birth: 1946/06/10 Referring Provider (PT): Laurence Spates   Encounter Date: 10/31/2018  PT End of Session - 10/31/18 1022    Visit Number  8    Date for PT Re-Evaluation  11/25/18    PT Start Time  0930    PT Stop Time  1036    PT Time Calculation (min)  66 min    Activity Tolerance  Patient tolerated treatment well    Behavior During Therapy  [facility] for tasks assessed/performed       History reviewed. No pertinent past medical history.  History reviewed. No pertinent surgical history.  There were no vitals filed for this visit.  Subjective Assessment - 10/31/18 0932    Subjective  Patient reports that he is doing ok, not having any pain    Currently in Pain?  No/denies    Pain Score  0-No pain                       [facility] Adult PT Treatment/Exercise - 10/31/18 0001      Lumbar Exercises: Aerobic   Nustep  level 5 x 5 minutes    Other Aerobic Exercise  resisted gait all directions'      Lumbar Exercises: Machines for Strengthening   Cybex Knee Flexion  35lb 2x10     Other Lumbar Machine Exercise  seated row 35#, lats 35#    Other Lumbar Machine Exercise  20# AR press, 20# stratight arm pull for core, 5# hip abduction and extension      Moist Heat Therapy   Number Minutes Moist Heat  15 Minutes    Moist Heat Location  Lumbar Spine      Electrical Stimulation   Electrical Stimulation Location  lumbar    Electrical Stimulation Action  IFC    Electrical Stimulation Parameters  supine    Electrical Stimulation Goals  Pain      Traction   Type of Traction  Lumbar    Min (lbs)  65    Max (lbs)  85    Hold Time  60    Rest Time  20    Time  15               PT Short Term Goals - 10/05/18 0916      PT  SHORT TERM GOAL #1   Title  independent with HEP    Status  Achieved        PT Long Term Goals - 10/31/18 1013      PT LONG TERM GOAL #1   Title  report no pain when getting up in the AM    Status  Partially Met      PT LONG TERM GOAL #2   Title  walk without issue his 3-4 miles    Status  Partially Met      PT LONG TERM GOAL #3   Title  increase SLR to 60 degrees    Status  On-going            Plan - 10/31/18 1010    Clinical Impression Statement  Patient with some noticeable hip weakness in L hip compared with to the R. He repots replicating therapy interventions in the "Y". Reports no pain during  today's session. Cues to increase ROM with seated leg curls. Pt did well with anti rotational presses. Positive response to modalities.    Rehab Potential  Good    PT Frequency  2x / week    PT Treatment/Interventions  ADLs/Self Care Home Management;Cryotherapy;Electrical Stimulation;Moist Heat;Iontophoresis '4mg'$ /ml Dexamethasone;Gait training;Therapeutic exercise;Therapeutic activities;Patient/family education;Manual techniques;Traction    PT Next Visit Plan  continue to add core and flexibility for home and gym       Patient will benefit from skilled therapeutic intervention in order to improve the following deficits and impairments:  Abnormal gait, Decreased mobility, Impaired flexibility, Improper body mechanics, Pain, Increased muscle spasms, Increased fascial restricitons, Difficulty walking, Decreased range of motion  Visit Diagnosis: Acute bilateral low back pain with left-sided sciatica  Pain in left hip  Pain in right hip     Problem List Patient Active Problem List   Diagnosis Date Noted  . Lumbar radiculopathy 09/24/2018  . Foraminal stenosis of lumbar region 09/24/2018  . Spondylosis without myelopathy or radiculopathy, lumbar region 08/29/2016  . Chronic left-sided low back pain without sciatica 08/29/2016    Scot Jun, PTA 10/31/2018, 10:22  AM  [place], [place], [address] Phone: [phone]   Fax:  [phone]  Name: Nicco Reaume MRN: [id number] Date of Birth: 1946-06-22

## 2018-11-02 ENCOUNTER — Encounter: Payer: Self-pay | Admitting: Physical Therapy

## 2018-11-02 ENCOUNTER — Ambulatory Visit: Payer: Medicare HMO | Admitting: Physical Therapy

## 2018-11-02 DIAGNOSIS — M25551 Pain in right hip: Secondary | ICD-10-CM

## 2018-11-02 DIAGNOSIS — M25552 Pain in left hip: Secondary | ICD-10-CM | POA: Diagnosis not present

## 2018-11-02 DIAGNOSIS — M5442 Lumbago with sciatica, left side: Secondary | ICD-10-CM

## 2018-11-02 NOTE — Therapy (Signed)
[place], [place], [address] Phone: [phone]   Fax:  [phone]  Physical Therapy Treatment  Patient Details  Name: Ricardo Stewart MRN: [id number] Date of Birth: [birth year] Referring Provider (PT): Laurence Spates   Encounter Date: 11/02/2018  PT End of Session - 11/02/18 0955    Visit Number  9    Date for PT Re-Evaluation  11/25/18    PT Start Time  0930    PT Stop Time  1025    PT Time Calculation (min)  55 min    Activity Tolerance  Patient tolerated treatment well    Behavior During Therapy  [facility] for tasks assessed/performed       History reviewed. No pertinent past medical history.  History reviewed. No pertinent surgical history.  There were no vitals filed for this visit.  Subjective Assessment - 11/02/18 0931    Subjective  Pt reports that he may have over did it in the gym yesterday, stated a nagging pain in L buttocks.    Currently in Pain?  Yes    Pain Score  2     Pain Location  Buttocks    Pain Orientation  Left    Pain Descriptors / Indicators  Nagging         [facility] PT Assessment - 11/02/18 0001      AROM   Overall AROM Comments  Lumbar ROM WFL except for ext dec 50%                   [facility] Adult PT Treatment/Exercise - 11/02/18 0001      Lumbar Exercises: Aerobic   Nustep  level 5 x 6 minutes      Lumbar Exercises: Machines for Strengthening   Other Lumbar Machine Exercise  seated row 45#, lats 45#      Lumbar Exercises: Standing   Other Standing Lumbar Exercises  ressited  gait 40lb 4 way x4 each       Moist Heat Therapy   Number Minutes Moist Heat  15 Minutes    Moist Heat Location  Lumbar Spine      Electrical Stimulation   Electrical Stimulation Location  lumbar    Electrical Stimulation Action  IFC    Electrical Stimulation Parameters  supine    Electrical Stimulation Goals  Pain      Traction   Type of Traction  Lumbar    Min (lbs)   70    Max (lbs)  90    Hold Time  60    Rest Time  20    Time  15               PT Short Term Goals - 10/05/18 0916      PT SHORT TERM GOAL #1   Title  independent with HEP    Status  Achieved        PT Long Term Goals - 11/02/18 0956      PT LONG TERM GOAL #4   Title  understand posture and body mechanics    Status  Partially Met            Plan - 11/02/18 0956    Clinical Impression Statement  Patient has progressed increasing his lumbar ROM. Increase resistance tolerated with seated rows and lats. Cues to go through the full ROM with the leg curls. Some instability with resisted gait. Cues to bend knees and not walk stiff  legged with backwards walking.     Rehab Potential  Good    PT Frequency  2x / week    PT Treatment/Interventions  ADLs/Self Care Home Management;Cryotherapy;Electrical Stimulation;Moist Heat;Iontophoresis '4mg'$ /ml Dexamethasone;Gait training;Therapeutic exercise;Therapeutic activities;Patient/family education;Manual techniques;Traction    PT Next Visit Plan  continue to add core and flexibility for home and gym       Patient will benefit from skilled therapeutic intervention in order to improve the following deficits and impairments:  Abnormal gait, Decreased mobility, Impaired flexibility, Improper body mechanics, Pain, Increased muscle spasms, Increased fascial restricitons, Difficulty walking, Decreased range of motion  Visit Diagnosis: Pain in left hip  Acute bilateral low back pain with left-sided sciatica  Pain in right hip     Problem List Patient Active Problem List   Diagnosis Date Noted  . Lumbar radiculopathy 09/24/2018  . Foraminal stenosis of lumbar region 09/24/2018  . Spondylosis without myelopathy or radiculopathy, lumbar region 08/29/2016  . Chronic left-sided low back pain without sciatica 08/29/2016    Scot Jun 11/02/2018, 9:58 AM  [place], [place], [address] Phone: [phone]   Fax:  [phone]  Name: Ricardo Stewart MRN: [id number] Date of Birth: 08-31-1946

## 2018-11-07 ENCOUNTER — Encounter: Payer: Self-pay | Admitting: Physical Therapy

## 2018-11-07 ENCOUNTER — Ambulatory Visit: Payer: Medicare HMO | Admitting: Physical Therapy

## 2018-11-07 DIAGNOSIS — M25552 Pain in left hip: Secondary | ICD-10-CM

## 2018-11-07 DIAGNOSIS — M5442 Lumbago with sciatica, left side: Secondary | ICD-10-CM

## 2018-11-07 DIAGNOSIS — M25551 Pain in right hip: Secondary | ICD-10-CM

## 2018-11-07 NOTE — Therapy (Signed)
[facility] [place] [address] [place], [place], [address] Phone: [phone]   Fax:  [phone] Progress Note Reporting Period 09/26/18 to 11/07/18 for the first 10 visits  See note below for Objective Data and Assessment of Progress/Goals.      Physical Therapy Treatment  Patient Details  Name: Ricardo Stewart MRN: [id number] Date of Birth: Sep 02, 1946 Referring Provider (PT): Naaman Plummer   Encounter Date: 11/07/2018  PT End of Session - 11/07/18 0956    Visit Number  10    Date for PT Re-Evaluation  11/25/18    PT Start Time  0930    PT Stop Time  1020    PT Time Calculation (min)  50 min    Activity Tolerance  Patient tolerated treatment well    Behavior During Therapy  [facility] for tasks assessed/performed       History reviewed. No pertinent past medical history.  History reviewed. No pertinent surgical history.  There were no vitals filed for this visit.  Subjective Assessment - 11/07/18 0928    Subjective  Pt reports that he is feeling good today, but some nagging LBP Saturday that he thinks comes from lifting stuff    Currently in Pain?  No/denies    Pain Score  0-No pain                       [facility] Adult PT Treatment/Exercise - 11/07/18 0001      Lumbar Exercises: Aerobic   Elliptical  R=7 I=7 x 3 minutes, needs a lot of cues, did better today    Nustep  level 5 x 6 minutes      Lumbar Exercises: Machines for Strengthening   Leg Press  40lb 2x10     Other Lumbar Machine Exercise  seated row 45#, lats 45#      Lumbar Exercises: Standing   Other Standing Lumbar Exercises  ressited  gait 40lb 4 way x4 each       Moist Heat Therapy   Number Minutes Moist Heat  15 Minutes    Moist Heat Location  Lumbar Spine      Electrical Stimulation   Electrical Stimulation Location  lumbar    Electrical Stimulation Action  IFC    Electrical Stimulation Parameters  supine    Electrical Stimulation  Goals  Pain      Traction   Type of Traction  Lumbar    Min (lbs)  70    Max (lbs)  90    Hold Time  60    Rest Time  20    Time  15               PT Short Term Goals - 10/05/18 0916      PT SHORT TERM GOAL #1   Title  independent with HEP    Status  Achieved        PT Long Term Goals - 11/07/18 0957      PT LONG TERM GOAL #4   Title  understand posture and body mechanics    Status  Achieved            Plan - 11/07/18 0957    Clinical Impression Statement  Pt reports that he has been pretty much pain free until he picked up something heavy Saturday that aggravated his low back. He reports that he continues to go to the gym and sometimes over doing it. Informed pt  not  to advance too fast. Introduced leg press interventions without reports of pain. He did need cues to slow down on the eccentric phase.     PT Duration  8 weeks    PT Treatment/Interventions  ADLs/Self Care Home Management;Cryotherapy;Electrical Stimulation;Moist Heat;Iontophoresis 4mg /ml Dexamethasone;Gait training;Therapeutic exercise;Therapeutic activities;Patient/family education;Manual techniques;Traction    PT Next Visit Plan  continue to add core and flexibility for home and gym       Patient will benefit from skilled therapeutic intervention in order to improve the following deficits and impairments:  Abnormal gait, Decreased mobility, Impaired flexibility, Improper body mechanics, Pain, Increased muscle spasms, Increased fascial restricitons, Difficulty walking, Decreased range of motion  Visit Diagnosis: Pain in left hip  Acute bilateral low back pain with left-sided sciatica  Pain in right hip     Problem List Patient Active Problem List   Diagnosis Date Noted  . Lumbar radiculopathy 09/24/2018  . Foraminal stenosis of lumbar region 09/24/2018  . Spondylosis without myelopathy or radiculopathy, lumbar region 08/29/2016  . Chronic left-sided low back pain without sciatica  08/29/2016    Grayce Sessions, PTA 11/07/2018, 10:00 AM  [facility] [place] [address] [place], [place], [address] Phone: [phone]   Fax:  [phone]  Name: Ricardo Stewart MRN: [id number] Date of Birth: [birth year]

## 2018-11-09 ENCOUNTER — Ambulatory Visit: Payer: Medicare HMO | Admitting: Physical Therapy

## 2018-11-09 ENCOUNTER — Encounter: Payer: Self-pay | Admitting: Physical Therapy

## 2018-11-09 DIAGNOSIS — M25551 Pain in right hip: Secondary | ICD-10-CM | POA: Diagnosis not present

## 2018-11-09 DIAGNOSIS — M25552 Pain in left hip: Secondary | ICD-10-CM

## 2018-11-09 DIAGNOSIS — M5442 Lumbago with sciatica, left side: Secondary | ICD-10-CM

## 2018-11-09 NOTE — Therapy (Signed)
[place], [place], [address] Phone: [phone]   Fax:  [phone]  Physical Therapy Treatment  Patient Details  Name: Ricardo Stewart MRN: [id number] Date of Birth: 13-Jun-1946 Referring Provider (PT): Laurence Spates   Encounter Date: 11/09/2018  PT End of Session - 11/09/18 0917    Visit Number  11    Date for PT Re-Evaluation  11/25/18    PT Start Time  0838    PT Stop Time  0936    PT Time Calculation (min)  58 min    Activity Tolerance  Patient tolerated treatment well    Behavior During Therapy  [facility] for tasks assessed/performed       History reviewed. No pertinent past medical history.  History reviewed. No pertinent surgical history.  There were no vitals filed for this visit.  Subjective Assessment - 11/09/18 0842    Subjective  Patient reports that he has been doing well but doing the vacuuming at home today caused some increased pain.    Currently in Pain?  Yes    Pain Score  3     Pain Location  Buttocks    Pain Orientation  Left    Aggravating Factors   vacuuming                       [facility] Adult PT Treatment/Exercise - 11/09/18 0001      Lumbar Exercises: Aerobic   Nustep  level 5 x 6 minutes      Lumbar Exercises: Machines for Strengthening   Leg Press  40lb 2x10 , 30# single legs c/o bilateral knee pain today, stopped midway    Other Lumbar Machine Exercise  20# AR press, 20# stratight arm pull for core, 5# hip abduction and extension, gave red and yellow tband and had him perform hip abd and extension for him to start at home, this was difficult due to left hip weakness      Lumbar Exercises: Standing   Other Standing Lumbar Exercises  resisted gait side stepping      Moist Heat Therapy   Number Minutes Moist Heat  12 Minutes    Moist Heat Location  Lumbar Spine      Electrical Stimulation   Electrical Stimulation Location  lumbar    Electrical  Stimulation Action  IFC    Electrical Stimulation Parameters  supine    Electrical Stimulation Goals  Pain      Traction   Type of Traction  Lumbar    Min (lbs)  70    Max (lbs)  94    Hold Time  60    Rest Time  20    Time  15             PT Education - 11/09/18 0916    Education Details  red and yellow tband for hip abduction and extension at home as well as AR press    Person(s) Educated  Patient    Methods  Explanation;Demonstration;Tactile cues;Verbal cues    Comprehension  Verbalized understanding;Returned demonstration;Verbal cues required       PT Short Term Goals - 10/05/18 0916      PT SHORT TERM GOAL #1   Title  independent with HEP    Status  Achieved        PT Long Term Goals - 11/09/18 0918      PT LONG TERM GOAL #1   Title  report no pain when getting up in the AM    Status  Partially Met      PT LONG TERM GOAL #2   Title  walk without issue his 3-4 miles    Status  Partially Met            Plan - 11/09/18 0917    Clinical Impression Statement  Patient has been doing very well has had some set back with lifting heavy items around house with the move and then trying to use the vacuum today, his left hip is very weak, especially abduction.      PT Next Visit Plan  work on hip strength    Consulted and Agree with Plan of Care  Patient       Patient will benefit from skilled therapeutic intervention in order to improve the following deficits and impairments:  Abnormal gait, Decreased mobility, Impaired flexibility, Improper body mechanics, Pain, Increased muscle spasms, Increased fascial restricitons, Difficulty walking, Decreased range of motion  Visit Diagnosis: Pain in left hip  Acute bilateral low back pain with left-sided sciatica     Problem List Patient Active Problem List   Diagnosis Date Noted  . Lumbar radiculopathy 09/24/2018  . Foraminal stenosis of lumbar region 09/24/2018  . Spondylosis without myelopathy or  radiculopathy, lumbar region 08/29/2016  . Chronic left-sided low back pain without sciatica 08/29/2016    Sumner Boast., PT 11/09/2018, 9:19 AM  [place] [address] [address] [place], [place], [address] Phone: [phone]   Fax:  [phone]  Name: Ricardo Stewart MRN: [id number] Date of Birth: April 27, 1946

## 2018-11-14 ENCOUNTER — Ambulatory Visit: Payer: Medicare HMO | Attending: Physical Medicine and Rehabilitation | Admitting: Physical Therapy

## 2018-11-14 ENCOUNTER — Encounter: Payer: Self-pay | Admitting: Physical Therapy

## 2018-11-14 DIAGNOSIS — M25551 Pain in right hip: Secondary | ICD-10-CM

## 2018-11-14 DIAGNOSIS — M5442 Lumbago with sciatica, left side: Secondary | ICD-10-CM | POA: Diagnosis not present

## 2018-11-14 DIAGNOSIS — M25552 Pain in left hip: Secondary | ICD-10-CM | POA: Diagnosis not present

## 2018-11-14 NOTE — Therapy (Signed)
[place], [place], [address] Phone: [phone]   Fax:  [phone]  Physical Therapy Treatment  Patient Details  Name: Ricardo Stewart MRN: [id number] Date of Birth: 01-15-1946 Referring Provider (PT): Laurence Spates   Encounter Date: 11/14/2018  PT End of Session - 11/14/18 0913    Visit Number  12    Date for PT Re-Evaluation  11/25/18    PT Start Time  0845    PT Stop Time  0943    PT Time Calculation (min)  58 min    Activity Tolerance  Patient tolerated treatment well    Behavior During Therapy  [facility] for tasks assessed/performed       History reviewed. No pertinent past medical history.  History reviewed. No pertinent surgical history.  There were no vitals filed for this visit.  Subjective Assessment - 11/14/18 0846    Subjective  Pt reports that he is doing good    Currently in Pain?  Yes    Pain Score  2     Pain Location  Back    Pain Orientation  Mid    Pain Descriptors / Indicators  Tightness                       [facility] Adult PT Treatment/Exercise - 11/14/18 0001      Lumbar Exercises: Aerobic   Nustep  level 5 x 6 minutes      Lumbar Exercises: Machines for Strengthening   Leg Press  40lb 2x10     Other Lumbar Machine Exercise  20# AR press, 25# stratight arm pull for core, 5# hip abduction and extension,      Lumbar Exercises: Standing   Other Standing Lumbar Exercises  Standing side step with balck band       Moist Heat Therapy   Number Minutes Moist Heat  15 Minutes    Moist Heat Location  Lumbar Spine      Electrical Stimulation   Electrical Stimulation Location  lumbar    Electrical Stimulation Action  IFC    Electrical Stimulation Parameters  supine    Electrical Stimulation Goals  Pain      Traction   Type of Traction  Lumbar    Min (lbs)  70    Max (lbs)  95    Hold Time  60    Rest Time  20    Time  15               PT Short  Term Goals - 10/05/18 0916      PT SHORT TERM GOAL #1   Title  independent with HEP    Status  Achieved        PT Long Term Goals - 11/09/18 2330      PT LONG TERM GOAL #1   Title  report no pain when getting up in the AM    Status  Partially Met      PT LONG TERM GOAL #2   Title  walk without issue his 3-4 miles    Status  Partially Met            Plan - 11/14/18 0914    Clinical Impression Statement  Hip and core strength for today's treatment session. Hip weakness and burning with resisted side steps. Tactile cues needed to keep core engaged with straight arm pull downs.     Rehab Potential  Good    PT Frequency  2x / week    PT Duration  8 weeks    PT Treatment/Interventions  ADLs/Self Care Home Management;Cryotherapy;Electrical Stimulation;Moist Heat;Iontophoresis '4mg'$ /ml Dexamethasone;Gait training;Therapeutic exercise;Therapeutic activities;Patient/family education;Manual techniques;Traction    PT Next Visit Plan  work on hip strength       Patient will benefit from skilled therapeutic intervention in order to improve the following deficits and impairments:  Abnormal gait, Decreased mobility, Impaired flexibility, Improper body mechanics, Pain, Increased muscle spasms, Increased fascial restricitons, Difficulty walking, Decreased range of motion  Visit Diagnosis: Pain in left hip  Acute bilateral low back pain with left-sided sciatica  Pain in right hip     Problem List Patient Active Problem List   Diagnosis Date Noted  . Lumbar radiculopathy 09/24/2018  . Foraminal stenosis of lumbar region 09/24/2018  . Spondylosis without myelopathy or radiculopathy, lumbar region 08/29/2016  . Chronic left-sided low back pain without sciatica 08/29/2016    Scot Jun 11/14/2018, 9:18 AM  [place], [place], [address] Phone: [phone]   Fax:  [phone]  Name: Ricardo Stewart MRN: [id number] Date of Birth: 04/22/1946

## 2018-11-21 ENCOUNTER — Encounter: Payer: Self-pay | Admitting: Physical Therapy

## 2018-11-21 ENCOUNTER — Other Ambulatory Visit: Payer: Self-pay

## 2018-11-21 ENCOUNTER — Ambulatory Visit: Payer: Medicare HMO | Admitting: Physical Therapy

## 2018-11-21 DIAGNOSIS — M25552 Pain in left hip: Secondary | ICD-10-CM | POA: Diagnosis not present

## 2018-11-21 DIAGNOSIS — M5442 Lumbago with sciatica, left side: Secondary | ICD-10-CM

## 2018-11-21 DIAGNOSIS — M25551 Pain in right hip: Secondary | ICD-10-CM | POA: Diagnosis not present

## 2018-11-21 NOTE — Therapy (Signed)
[place], [place], [address] Phone: [phone]   Fax:  [phone]  Physical Therapy Treatment  Patient Details  Name: Ricardo Stewart MRN: [id number] Date of Birth: Apr 11, 1946 Referring Provider (PT): Laurence Spates   Encounter Date: 11/21/2018  PT End of Session - 11/21/18 1203    Visit Number  13    Date for PT Re-Evaluation  11/25/18    PT Start Time  0844    PT Stop Time  0940    PT Time Calculation (min)  56 min    Activity Tolerance  Patient tolerated treatment well    Behavior During Therapy  [facility] for tasks assessed/performed       History reviewed. No pertinent past medical history.  History reviewed. No pertinent surgical history.  There were no vitals filed for this visit.  Subjective Assessment - 11/21/18 0904    Subjective  Patient reports that he was spreading pine needles and felt some left shoulder pain, he reports that his back is stiff    Currently in Pain?  Yes    Pain Score  4     Pain Location  Shoulder   back lower   Pain Orientation  Left    Aggravating Factors   spreading pine needles         [facility] PT Assessment - 11/21/18 0001      Special Tests   Other special tests  looked at his left shoulder, negative empty can, negative impingement, strength was good pain mostly in the left anterior shoulder, he had some pain iwth ER and any reaching above shoulder, has tenderness int he anterior lateral shoulder                   [facility] Adult PT Treatment/Exercise - 11/21/18 0001      Lumbar Exercises: Stretches   Passive Hamstring Stretch  4 reps;20 seconds    ITB Stretch  3 reps;20 seconds    Piriformis Stretch  4 reps;20 seconds      Modalities   Modalities  Ultrasound      Moist Heat Therapy   Number Minutes Moist Heat  15 Minutes    Moist Heat Location  Lumbar Spine      Electrical Stimulation   Electrical Stimulation Location  lumbar    Electrical Stimulation Action  IFC    Electrical Stimulation Parameters  supine    Electrical Stimulation Goals  Pain      Ultrasound   Ultrasound Location  left shoulder    Ultrasound Parameters  1MHz 1.4w/cm2    Ultrasound Goals  Pain               PT Short Term Goals - 10/05/18 0916      PT SHORT TERM GOAL #1   Title  independent with HEP    Status  Achieved        PT Long Term Goals - 11/21/18 1206      PT LONG TERM GOAL #1   Title  report no pain when getting up in the AM    Status  Partially Met      PT LONG TERM GOAL #2   Title  walk without issue his 3-4 miles    Status  Partially Met            Plan - 11/21/18 1203    Clinical Impression Statement  Patient reports injured shoulder on Monday.  I did  some testing and it seems like a tendonits as his empty can and impingement tests were negative.  Had some tightness and stiffness in his back today as well    PT Next Visit Plan  work on hip strength if the shoulder is still an issue may refer to MD    Consulted and Agree with Plan of Care  Patient       Patient will benefit from skilled therapeutic intervention in order to improve the following deficits and impairments:  Abnormal gait, Decreased mobility, Impaired flexibility, Improper body mechanics, Pain, Increased muscle spasms, Increased fascial restricitons, Difficulty walking, Decreased range of motion  Visit Diagnosis: Pain in left hip  Acute bilateral low back pain with left-sided sciatica     Problem List Patient Active Problem List   Diagnosis Date Noted  . Lumbar radiculopathy 09/24/2018  . Foraminal stenosis of lumbar region 09/24/2018  . Spondylosis without myelopathy or radiculopathy, lumbar region 08/29/2016  . Chronic left-sided low back pain without sciatica 08/29/2016    Sumner Boast., PT 11/21/2018, 12:07 PM  [place],  [place], [address] Phone: [phone]   Fax:  [phone]  Name: Ricardo Stewart MRN: [id number] Date of Birth: 1945/10/03

## 2018-11-28 ENCOUNTER — Other Ambulatory Visit: Payer: Self-pay

## 2018-11-28 ENCOUNTER — Encounter: Payer: Self-pay | Admitting: Physical Therapy

## 2018-11-28 ENCOUNTER — Ambulatory Visit: Payer: Medicare HMO | Admitting: Physical Therapy

## 2018-11-28 DIAGNOSIS — M25551 Pain in right hip: Secondary | ICD-10-CM | POA: Diagnosis not present

## 2018-11-28 DIAGNOSIS — M5442 Lumbago with sciatica, left side: Secondary | ICD-10-CM | POA: Diagnosis not present

## 2018-11-28 DIAGNOSIS — M25552 Pain in left hip: Secondary | ICD-10-CM | POA: Diagnosis not present

## 2018-11-28 NOTE — Therapy (Signed)
[place], [place], [address] Phone: [phone]   Fax:  [phone]  Physical Therapy Treatment  Patient Details  Name: Ricardo Stewart MRN: [id number] Date of Birth: October 11, 1945 Referring Provider (PT): Laurence Spates   Encounter Date: 11/28/2018  PT End of Session - 11/28/18 1332    Visit Number  14    Date for PT Re-Evaluation  12/29/18    PT Start Time  7867    PT Stop Time  1409    PT Time Calculation (min)  63 min    Activity Tolerance  Patient tolerated treatment well    Behavior During Therapy  [facility] for tasks assessed/performed       History reviewed. No pertinent past medical history.  History reviewed. No pertinent surgical history.  There were no vitals filed for this visit.  Subjective Assessment - 11/28/18 1314    Subjective  Patient reports overall feeling a little better.      Currently in Pain?  Yes    Pain Score  2     Pain Location  Back    Aggravating Factors   yardwork         [facility] PT Assessment - 11/28/18 0001      Assessment   Medical Diagnosis  lumbar radiculopathy, foraminal stenosis, left ischial bursitis    Referring Provider (PT)  Laurence Spates                   [facility] Adult PT Treatment/Exercise - 11/28/18 0001      Lumbar Exercises: Aerobic   Nustep  level 5 x 6 minutes    Other Aerobic Exercise  resisted gait all directions'      Lumbar Exercises: Machines for Strengthening   Other Lumbar Machine Exercise  seated row 45#, lats 45#    Other Lumbar Machine Exercise  5# hip extension and abduction      Modalities   Modalities  Electrical Stimulation;Moist Heat;Traction      Moist Heat Therapy   Number Minutes Moist Heat  15 Minutes    Moist Heat Location  Lumbar Spine      Electrical Stimulation   Electrical Stimulation Location  lumbar    Electrical Stimulation Action  IFC    Electrical Stimulation Parameters  supine    Electrical  Stimulation Goals  Pain      Traction   Type of Traction  Lumbar    Min (lbs)  70    Max (lbs)  95    Hold Time  60    Rest Time  20    Time  15               PT Short Term Goals - 10/05/18 0916      PT SHORT TERM GOAL #1   Title  independent with HEP    Status  Achieved        PT Long Term Goals - 11/28/18 1343      PT LONG TERM GOAL #1   Title  report no pain when getting up in the AM    Status  Partially Met      PT LONG TERM GOAL #2   Title  walk without issue his 3-4 miles    Status  Partially Met      PT LONG TERM GOAL #3   Title  increase SLR to 60 degrees    Status  Partially Met  PT LONG TERM GOAL #4   Title  understand posture and body mechanics    Status  Achieved            Plan - 11/28/18 1333    Clinical Impression Statement  Patient has tried to replicate a lot of stuff at the gym and is doing well, still very weak int he left hip.  He has resumed some yardwork and has had a flare up of shoulder pain and a slight increase in back pain.  He is requiring some verbal and tactile instruction to do exercises correctly as he tends to use his body to compensate    PT Next Visit Plan  feels like shoulder is better, will focus on the hips    Consulted and Agree with Plan of Care  Patient       Patient will benefit from skilled therapeutic intervention in order to improve the following deficits and impairments:  Abnormal gait, Decreased mobility, Impaired flexibility, Improper body mechanics, Pain, Increased muscle spasms, Increased fascial restricitons, Difficulty walking, Decreased range of motion  Visit Diagnosis: Pain in left hip - Plan: PT plan of care cert/re-cert  Acute bilateral low back pain with left-sided sciatica - Plan: PT plan of care cert/re-cert     Problem List Patient Active Problem List   Diagnosis Date Noted  . Lumbar radiculopathy 09/24/2018  . Foraminal stenosis of lumbar region 09/24/2018  . Spondylosis  without myelopathy or radiculopathy, lumbar region 08/29/2016  . Chronic left-sided low back pain without sciatica 08/29/2016    Sumner Boast., PT 11/28/2018, 1:44 PM  [place], [place], [address] Phone: [phone]   Fax:  [phone]  Name: Ricardo Stewart MRN: [id number] Date of Birth: 1946/07/24

## 2018-12-05 ENCOUNTER — Encounter: Payer: Medicare HMO | Admitting: Physical Therapy

## 2018-12-11 ENCOUNTER — Ambulatory Visit: Payer: Medicare HMO | Admitting: Physical Therapy

## 2018-12-14 ENCOUNTER — Ambulatory Visit: Payer: Medicare HMO | Attending: Physical Medicine and Rehabilitation | Admitting: Physical Therapy

## 2018-12-14 ENCOUNTER — Encounter: Payer: Self-pay | Admitting: Physical Therapy

## 2018-12-14 ENCOUNTER — Other Ambulatory Visit: Payer: Self-pay

## 2018-12-14 DIAGNOSIS — M5442 Lumbago with sciatica, left side: Secondary | ICD-10-CM | POA: Diagnosis not present

## 2018-12-14 DIAGNOSIS — M25551 Pain in right hip: Secondary | ICD-10-CM | POA: Insufficient documentation

## 2018-12-14 DIAGNOSIS — M25512 Pain in left shoulder: Secondary | ICD-10-CM | POA: Diagnosis not present

## 2018-12-14 DIAGNOSIS — M25552 Pain in left hip: Secondary | ICD-10-CM | POA: Insufficient documentation

## 2018-12-14 NOTE — Patient Instructions (Signed)
Access Code: [email or web address]  URL: [email or web address]  Date: 12/14/2018  Prepared by: Stacie Glaze   Exercises  Shoulder External Rotation and Scapular Retraction with Resistance - 10 reps - 2 sets - 3 hold - 2x daily - 7x weekly  Standing Bilateral Low Shoulder Row with Anchored Resistance - 10 reps - 2 sets - 3 hold - 2x daily - 7x weekly  Shoulder Extension with Resistance - 10 reps - 2 sets - 3 hold - 2x daily - 7x weekly

## 2018-12-14 NOTE — Therapy (Signed)
[place], [place], [address] Phone: [phone]   Fax:  [phone]  Physical Therapy Treatment  Patient Details  Name: Divit Stipp MRN: [id number] Date of Birth: April 16, 1946 Referring Provider (PT): Laurence Spates   Encounter Date: 12/14/2018  PT End of Session - 12/14/18 1057    Visit Number  15    Date for PT Re-Evaluation  12/29/18    PT Start Time  1013    PT Stop Time  1057    PT Time Calculation (min)  44 min    Activity Tolerance  Patient tolerated treatment well    Behavior During Therapy  [facility] for tasks assessed/performed       History reviewed. No pertinent past medical history.  History reviewed. No pertinent surgical history.  There were no vitals filed for this visit.  Subjective Assessment - 12/14/18 1014    Subjective  Patient reports that he has upped his walking and is feeling okay, still will get pain with the walking, also had some pain in the left anterior shoulder and biceps area with reaching for a book that was falling    Currently in Pain?  Yes    Pain Score  4     Pain Location  Back    Pain Orientation  Lower    Aggravating Factors   increased walking                       [facility] Adult PT Treatment/Exercise - 12/14/18 0001      Lumbar Exercises: Stretches   Passive Hamstring Stretch  4 reps;20 seconds    Lower Trunk Rotation  4 reps;10 seconds    Piriformis Stretch  4 reps;20 seconds      Lumbar Exercises: Standing   Row  20 reps;Theraband    Theraband Level (Row)  Level 2 (Red)    Shoulder Extension  Both;20 reps;Theraband    Theraband Level (Shoulder Extension)  Level 2 (Red)    Other Standing Lumbar Exercises  red tband ER 20 reps cues for good posture      Moist Heat Therapy   Number Minutes Moist Heat  15 Minutes    Moist Heat Location  Lumbar Spine      Electrical Stimulation   Electrical Stimulation Location  lumbar    Electrical  Stimulation Action  --   IFC   Electrical Stimulation Parameters  supine    Electrical Stimulation Goals  Pain      Iontophoresis   Type of Iontophoresis  Dexamethasone    Location  left shoulder    Dose  49m    Time  4 hour patch               PT Short Term Goals - 10/05/18 0916      PT SHORT TERM GOAL #1   Title  independent with HEP    Status  Achieved        PT Long Term Goals - 12/14/18 1109      PT LONG TERM GOAL #1   Title  report no pain when getting up in the AM    Status  Partially Met      PT LONG TERM GOAL #2   Title  walk without issue his 3-4 miles    Status  Partially Met      PT LONG TERM GOAL #3   Title  increase SLR to 60 degrees  Status  Partially Met            Plan - 12/14/18 1057    Clinical Impression Statement  Patient has not been able to go to the gym due to Covid, he reports that he has been trying to walk more for his exercise and reports some increased low back pain, he does report trying to do the exercises and stretches at home, he does report left shoulder pain after trying to catch a book, seems to be some biceps irritation    PT Next Visit Plan  resume some treatment may see limited due to covid-19    Consulted and Agree with Plan of Care  Patient       Patient will benefit from skilled therapeutic intervention in order to improve the following deficits and impairments:  Abnormal gait, Decreased mobility, Impaired flexibility, Improper body mechanics, Pain, Increased muscle spasms, Increased fascial restricitons, Difficulty walking, Decreased range of motion  Visit Diagnosis: Pain in left hip  Acute bilateral low back pain with left-sided sciatica     Problem List Patient Active Problem List   Diagnosis Date Noted  . Lumbar radiculopathy 09/24/2018  . Foraminal stenosis of lumbar region 09/24/2018  . Spondylosis without myelopathy or radiculopathy, lumbar region 08/29/2016  . Chronic left-sided low back  pain without sciatica 08/29/2016    Sumner Boast., PT 12/14/2018, 11:10 AM  [place], [place], [address] Phone: [phone]   Fax:  [phone]  Name: Khyle Goodell MRN: [id number] Date of Birth: 09-Apr-1946

## 2018-12-18 ENCOUNTER — Encounter: Payer: Self-pay | Admitting: Physical Therapy

## 2018-12-18 ENCOUNTER — Ambulatory Visit: Payer: Medicare HMO | Admitting: Physical Therapy

## 2018-12-18 ENCOUNTER — Other Ambulatory Visit: Payer: Self-pay

## 2018-12-18 DIAGNOSIS — M25551 Pain in right hip: Secondary | ICD-10-CM | POA: Diagnosis not present

## 2018-12-18 DIAGNOSIS — M25552 Pain in left hip: Secondary | ICD-10-CM

## 2018-12-18 DIAGNOSIS — M5442 Lumbago with sciatica, left side: Secondary | ICD-10-CM | POA: Diagnosis not present

## 2018-12-18 DIAGNOSIS — M25512 Pain in left shoulder: Secondary | ICD-10-CM | POA: Diagnosis not present

## 2018-12-18 NOTE — Therapy (Signed)
[place], [place], [address] Phone: [phone]   Fax:  [phone]  Physical Therapy Treatment  Patient Details  Name: Ricardo Stewart MRN: [id number] Date of Birth: December 11, 1945 Referring Provider (PT): Laurence Spates   Encounter Date: 12/18/2018  PT End of Session - 12/18/18 1045    Visit Number  16    Date for PT Re-Evaluation  12/29/18    PT Start Time  4742    PT Stop Time  1110    PT Time Calculation (min)  55 min    Activity Tolerance  Patient tolerated treatment well    Behavior During Therapy  [facility] for tasks assessed/performed       History reviewed. No pertinent past medical history.  History reviewed. No pertinent surgical history.  There were no vitals filed for this visit.  Subjective Assessment - 12/18/18 1018    Subjective  "We need to work on the shoulder" Pt reports the most difficulty with ER exercise    Currently in Pain?  Yes    Pain Score  2     Pain Location  Shoulder    Pain Orientation  Right                       [facility] Adult PT Treatment/Exercise - 12/18/18 0001      Lumbar Exercises: Aerobic   UBE (Upper Arm Bike)  L2 x 2 min each way     Nustep  level 4 x 6 minutes      Lumbar Exercises: Standing   Row  20 reps;Theraband    Theraband Level (Row)  Level 2 (Red)    Shoulder Extension  Both;20 reps;Theraband    Theraband Level (Shoulder Extension)  Level 2 (Red)    Other Standing Lumbar Exercises  red tband ER 20 reps cues for good posture, IR LEU green 2x10, Triceps ext 35lb 2x12    Other Standing Lumbar Exercises  Shoulder flex & abd 1lb 2x10-       Moist Heat Therapy   Number Minutes Moist Heat  15 Minutes    Moist Heat Location  Lumbar Spine      Electrical Stimulation   Electrical Stimulation Location  lumbar    Electrical Stimulation Action  IFC     Electrical Stimulation Parameters  seated    Electrical Stimulation Goals  Pain      Iontophoresis   Type of Iontophoresis  Dexamethasone    Location  left shoulder    Dose  27m    Time  4 hour patch               PT Short Term Goals - 10/05/18 0916      PT SHORT TERM GOAL #1   Title  independent with HEP    Status  Achieved        PT Long Term Goals - 12/14/18 1109      PT LONG TERM GOAL #1   Title  report no pain when getting up in the AM    Status  Partially Met      PT LONG TERM GOAL #2   Title  walk without issue his 3-4 miles    Status  Partially Met      PT LONG TERM GOAL #3   Title  increase SLR to 60 degrees    Status  Partially Met  Plan - 12/18/18 1046    Clinical Impression Statement  Pt reports that he is still walking 3 miles a day. He was able to complete all of today's interventions. Reports some difficulty with external rotation. Noted a pulling sensation in L shoulder with flexion and abduction.      Rehab Potential  Good    PT Frequency  2x / week    PT Duration  8 weeks    PT Treatment/Interventions  ADLs/Self Care Home Management;Cryotherapy;Electrical Stimulation;Moist Heat;Iontophoresis '4mg'$ /ml Dexamethasone;Gait training;Therapeutic exercise;Therapeutic activities;Patient/family education;Manual techniques;Traction    PT Next Visit Plan  resume some treatment, assess L shoulder, may see limited due to covid-19       Patient will benefit from skilled therapeutic intervention in order to improve the following deficits and impairments:  Abnormal gait, Decreased mobility, Impaired flexibility, Improper body mechanics, Pain, Increased muscle spasms, Increased fascial restricitons, Difficulty walking, Decreased range of motion  Visit Diagnosis: Pain in left hip  Acute bilateral low back pain with left-sided sciatica  Pain in right hip     Problem List Patient Active Problem List   Diagnosis Date Noted  . Lumbar radiculopathy 09/24/2018  . Foraminal stenosis of lumbar region 09/24/2018  . Spondylosis  without myelopathy or radiculopathy, lumbar region 08/29/2016  . Chronic left-sided low back pain without sciatica 08/29/2016    Scot Jun, PTA 12/18/2018, 10:54 AM  [place], [place], [address] Phone: [phone]   Fax:  [phone]  Name: Ricardo Stewart MRN: [id number] Date of Birth: [birth year]

## 2018-12-26 ENCOUNTER — Ambulatory Visit: Payer: Medicare HMO | Admitting: Physical Therapy

## 2018-12-26 ENCOUNTER — Encounter: Payer: Self-pay | Admitting: Physical Therapy

## 2018-12-26 ENCOUNTER — Other Ambulatory Visit: Payer: Self-pay

## 2018-12-26 DIAGNOSIS — M25551 Pain in right hip: Secondary | ICD-10-CM | POA: Diagnosis not present

## 2018-12-26 DIAGNOSIS — M25552 Pain in left hip: Secondary | ICD-10-CM

## 2018-12-26 DIAGNOSIS — M25512 Pain in left shoulder: Secondary | ICD-10-CM

## 2018-12-26 DIAGNOSIS — M5442 Lumbago with sciatica, left side: Secondary | ICD-10-CM

## 2018-12-26 NOTE — Therapy (Signed)
[place] [address] [place], [place], [address] Phone: [phone]   Fax:  [phone]  Physical Therapy Treatment  Patient Details  Name: Ricardo Stewart MRN: [id number] Date of Birth: 16-Nov-1945 Referring Provider (PT): Laurence Spates   Encounter Date: 12/26/2018  PT End of Session - 12/26/18 1004    Visit Number  17    Date for PT Re-Evaluation  01/28/19    PT Start Time  0930    PT Stop Time  1030    PT Time Calculation (min)  60 min    Activity Tolerance  Patient tolerated treatment well    Behavior During Therapy  [facility] for tasks assessed/performed       History reviewed. No pertinent past medical history.  History reviewed. No pertinent surgical history.  There were no vitals filed for this visit.  Subjective Assessment - 12/26/18 0933    Subjective  Patient reports that he has continued to have left anterior shoulder pain, reports that over the past week or two he has had some lateral pain, hurts with use    Currently in Pain?  Yes    Pain Score  2     Pain Location  Shoulder    Pain Orientation  Left    Pain Descriptors / Indicators  Aching;Sore    Aggravating Factors   pain in the arm can be up to 8/10          [facility] PT Assessment - 12/26/18 0001      Assessment   Medical Diagnosis  lumbar radiculopathy, foraminal stenosis, left ischial bursitis    Referring Provider (PT)  Laurence Spates      ROM / Strength   AROM / PROM / Strength  AROM;Strength      AROM   AROM Assessment Site  Shoulder    Right/Left Shoulder  Left    Left Shoulder Flexion  160 Degrees   pain starts at 90 degrees   Left Shoulder ABduction  150 Degrees   painful arc   Left Shoulder Internal Rotation  50 Degrees   "worst pain"   Left Shoulder External Rotation  90 Degrees      Strength   Strength Assessment Site  Shoulder    Right/Left Shoulder  Left    Left Shoulder Flexion  4/5    Left Shoulder ABduction  4/5    Left  Shoulder Internal Rotation  4/5    Left Shoulder External Rotation  3/5   significant pain     Special Tests   Other special tests  + impingement, empty can test is strong with mild pain                   [facility] Adult PT Treatment/Exercise - 12/26/18 0001      Lumbar Exercises: Standing   Other Standing Lumbar Exercises  arms in abducted position ER/IR repetitively as a tendon glide    Other Standing Lumbar Exercises  Shoulder flex & abd 1lb 2x10-       Moist Heat Therapy   Number Minutes Moist Heat  15 Minutes    Moist Heat Location  Shoulder;Lumbar Spine      Electrical Stimulation   Electrical Stimulation Location  lumbar    Electrical Stimulation Action  IFC    Electrical Stimulation Parameters  seated    Electrical Stimulation Goals  Pain      Ultrasound   Ultrasound Location  left shoulder  Ultrasound Parameters  1MHz 1.4 w/cm2    Ultrasound Goals  Pain      Iontophoresis   Type of Iontophoresis  Dexamethasone    Location  left shoulder    Dose  83m    Time  4 hour patch      Traction   Type of Traction  Lumbar    Min (lbs)  70    Max (lbs)  95    Hold Time  60    Rest Time  20    Time  15               PT Short Term Goals - 10/05/18 0916      PT SHORT TERM GOAL #1   Title  independent with HEP    Status  Achieved        PT Long Term Goals - 12/26/18 1007      PT LONG TERM GOAL #1   Title  report no pain when getting up in the AM    Status  Partially Met      PT LONG TERM GOAL #2   Title  walk without issue his 3-4 miles    Status  Partially Met      PT LONG TERM GOAL #3   Title  increase SLR to 60 degrees    Status  Partially Met      PT LONG TERM GOAL #4   Title  understand posture and body mechanics    Status  Achieved            Plan - 12/26/18 1005    Clinical Impression Statement  Patient reports that he has continued to have pain in the left shoulder, he has the most limited ROM in IR, he is very  painful with resisted ER.  He had positive impingement test, strong but painful empty can test.    PT Next Visit Plan  asked him to stop the resisted ER since this is the most painful, had him do the tendon flossing with arms straight ER/IFR at sides and at 90 degrees abduction    Consulted and Agree with Plan of Care  Patient       Patient will benefit from skilled therapeutic intervention in order to improve the following deficits and impairments:  Abnormal gait, Decreased mobility, Impaired flexibility, Improper body mechanics, Pain, Increased muscle spasms, Increased fascial restricitons, Difficulty walking, Decreased range of motion  Visit Diagnosis: Pain in left hip - Plan: PT plan of care cert/re-cert  Acute bilateral low back pain with left-sided sciatica - Plan: PT plan of care cert/re-cert  Acute pain of left shoulder - Plan: PT plan of care cert/re-cert     Problem List Patient Active Problem List   Diagnosis Date Noted  . Lumbar radiculopathy 09/24/2018  . Foraminal stenosis of lumbar region 09/24/2018  . Spondylosis without myelopathy or radiculopathy, lumbar region 08/29/2016  . Chronic left-sided low back pain without sciatica 08/29/2016    ASumner Boast, PT 12/26/2018, 10:10 AM  [place] [address]Phone: [phone]  Fax:  [phone] Name: Ricardo PlasenciaMRN: [id number] of Birth: 201-Aug-1947

## 2019-01-02 ENCOUNTER — Ambulatory Visit: Payer: Medicare HMO | Admitting: Physical Therapy

## 2019-01-02 ENCOUNTER — Other Ambulatory Visit: Payer: Self-pay

## 2019-01-02 ENCOUNTER — Encounter: Payer: Self-pay | Admitting: Physical Therapy

## 2019-01-02 DIAGNOSIS — M25551 Pain in right hip: Secondary | ICD-10-CM | POA: Diagnosis not present

## 2019-01-02 DIAGNOSIS — M5442 Lumbago with sciatica, left side: Secondary | ICD-10-CM

## 2019-01-02 DIAGNOSIS — M25512 Pain in left shoulder: Secondary | ICD-10-CM

## 2019-01-02 DIAGNOSIS — M25552 Pain in left hip: Secondary | ICD-10-CM

## 2019-01-02 NOTE — Therapy (Signed)
[address] [place], [place], [address] Phone: [phone]   Fax:  [phone]  Physical Therapy Treatment  Patient Details  Name: Mickle Campton MRN: [id number] Date of Birth: [birth year] Referring Provider (PT): Laurence Spates   Encounter Date: 01/02/2019  PT End of Session - 01/02/19 1008    Visit Number  18    Date for PT Re-Evaluation  01/28/19    PT Start Time  0930    PT Stop Time  1030    PT Time Calculation (min)  60 min    Activity Tolerance  Patient tolerated treatment well    Behavior During Therapy  [facility] for tasks assessed/performed       History reviewed. No pertinent past medical history.  History reviewed. No pertinent surgical history.  There were no vitals filed for this visit.  Subjective Assessment - 01/02/19 1005    Subjective  Patient continues with the left shoulder pain, low back pain and right hip pain, c/o some tightness in teh left buttock, reports that he is doing a lot around the house with the quarantine    Currently in Pain?  Yes    Pain Score  4     Pain Location  Back   left shoulder and right hip   Aggravating Factors   just doing a lot of stuff, the shoulder hurts iwth abduction and lifting with rotation                       [facility] Adult PT Treatment/Exercise - 01/02/19 0001      Lumbar Exercises: Stretches   Passive Hamstring Stretch  4 reps;20 seconds    Hip Flexor Stretch  Right;4 reps;20 seconds    ITB Stretch  4 reps;20 seconds    Piriformis Stretch  4 reps;20 seconds    Other Lumbar Stretch Exercise  quad stretch      Moist Heat Therapy   Number Minutes Moist Heat  15 Minutes    Moist Heat Location  Shoulder;Lumbar Spine      Electrical Stimulation   Electrical Stimulation Location  lumbar    Electrical Stimulation Action  IFC    Electrical Stimulation Parameters  supine    Electrical Stimulation Goals  Pain      Ultrasound   Ultrasound Location  left shoulder     Ultrasound Parameters  1MHz 100% 1.5w/cm2      Traction   Type of Traction  Lumbar    Min (lbs)  70    Max (lbs)  95    Hold Time  60    Rest Time  20    Time  15      Manual Therapy   Manual Therapy  Passive ROM    Passive ROM  hips and left shoulder               PT Short Term Goals - 10/05/18 0916      PT SHORT TERM GOAL #1   Title  independent with HEP    Status  Achieved        PT Long Term Goals - 01/02/19 1009      PT LONG TERM GOAL #1   Title  report no pain when getting up in the AM    Status  Partially Met      PT LONG TERM GOAL #2   Title  walk without issue his 3-4 miles  Status  Partially Met      PT LONG TERM GOAL #3   Title  increase SLR to 60 degrees    Status  Partially Met      PT LONG TERM GOAL #4   Title  understand posture and body mechanics    Status  Achieved            Plan - 01/02/19 1008    Clinical Impression Statement  Patietn reports that he has been very active around the house during the quarantine, reports that he is having LBP, left shoulder pain and right hip pain from doing things, reports difficulty sleeping due to the sholder pain.  He has very tight LE's, his PROM of the left shoulder was good but painful    PT Next Visit Plan  added right hip flexor stretch    Consulted and Agree with Plan of Care  Patient       Patient will benefit from skilled therapeutic intervention in order to improve the following deficits and impairments:  Abnormal gait, Decreased mobility, Impaired flexibility, Improper body mechanics, Pain, Increased muscle spasms, Increased fascial restricitons, Difficulty walking, Decreased range of motion  Visit Diagnosis: Pain in left hip  Acute bilateral low back pain with left-sided sciatica  Acute pain of left shoulder     Problem List Patient Active Problem List   Diagnosis Date Noted  . Lumbar radiculopathy 09/24/2018  . Foraminal stenosis  of lumbar region 09/24/2018  . Spondylosis without myelopathy or radiculopathy, lumbar region 08/29/2016  . Chronic left-sided low back pain without sciatica 08/29/2016    Sumner Boast., PT 01/02/2019, 10:10 AM  [address] [place], [place], [address] Phone: [phone]   Fax:  [phone]  Name: Markhi Kleckner MRN: [id number] Date of Birth: 07/28/1946

## 2019-01-07 ENCOUNTER — Other Ambulatory Visit: Payer: Self-pay

## 2019-01-07 ENCOUNTER — Ambulatory Visit: Payer: Medicare HMO | Admitting: Physical Therapy

## 2019-01-07 ENCOUNTER — Encounter: Payer: Self-pay | Admitting: Physical Therapy

## 2019-01-07 DIAGNOSIS — M5442 Lumbago with sciatica, left side: Secondary | ICD-10-CM

## 2019-01-07 DIAGNOSIS — M25551 Pain in right hip: Secondary | ICD-10-CM

## 2019-01-07 DIAGNOSIS — M25512 Pain in left shoulder: Secondary | ICD-10-CM | POA: Diagnosis not present

## 2019-01-07 DIAGNOSIS — M25552 Pain in left hip: Secondary | ICD-10-CM | POA: Diagnosis not present

## 2019-01-07 NOTE — Therapy (Signed)
[address] [place], [place], [address] Phone: [phone]   Fax:  [phone]  Physical Therapy Treatment  Patient Details  Name: Ricardo Stewart MRN: [id number] Date of Birth: 05/22/1946 Referring Provider (PT): Laurence Spates   Encounter Date: 01/07/2019  PT End of Session - 01/07/19 1029    Visit Number  19    Date for PT Re-Evaluation  01/28/19    PT Start Time  0930    PT Stop Time  7616    PT Time Calculation (min)  73 min       History reviewed. No pertinent past medical history.  History reviewed. No pertinent surgical history.  There were no vitals filed for this visit.  Subjective Assessment - 01/07/19 0933    Subjective  "I manage to pulley a hip flexor since the last time"     Currently in Pain?  Yes    Pain Score  4     Pain Location  Hip    Pain Orientation  Right;Anterior                       [facility] Adult PT Treatment/Exercise - 01/07/19 0001      Lumbar Exercises: Stretches   Passive Hamstring Stretch  4 reps;20 seconds    Hip Flexor Stretch  Right;4 reps;20 seconds    ITB Stretch  4 reps;20 seconds    Piriformis Stretch  4 reps;20 seconds      Lumbar Exercises: Supine   Other Supine Lumbar Exercises  L shoulder ER/IR 3lb 2x15 each       Moist Heat Therapy   Number Minutes Moist Heat  15 Minutes    Moist Heat Location  Shoulder;Lumbar Spine      Electrical Stimulation   Electrical Stimulation Location  lumbar    Electrical Stimulation Action  IFC    Electrical Stimulation Parameters  supine    Electrical Stimulation Goals  Pain      Ultrasound   Ultrasound Location  left shoulder    Ultrasound Parameters  1Mhz 1.3w/cm2     Ultrasound Goals  Pain      Traction   Type of Traction  Lumbar    Min (lbs)  75    Max (lbs)  90    Hold Time  60    Rest Time  20    Time  15      Manual Therapy   Manual Therapy  Passive ROM    Passive ROM  hips and left  shoulder               PT Short Term Goals - 10/05/18 0916      PT SHORT TERM GOAL #1   Title  independent with HEP    Status  Achieved        PT Long Term Goals - 01/02/19 1009      PT LONG TERM GOAL #1   Title  report no pain when getting up in the AM    Status  Partially Met      PT LONG TERM GOAL #2   Title  walk without issue his 3-4 miles    Status  Partially Met      PT LONG TERM GOAL #3   Title  increase SLR to 60 degrees    Status  Partially Met      PT LONG TERM GOAL #4   Title  understand  posture and body mechanics    Status  Achieved            Plan - 01/07/19 1031    Clinical Impression Statement  Pt continues to have L shoulder and R hip pain. Positive response to [place]. Pt has full L shoulder PROM. HE does reports some L shoulder tightness at end range. Added Supine ER/IR without issue. R hip pinching with single K2C stretch    Rehab Potential  Good    PT Frequency  2x / week    PT Duration  8 weeks    PT Treatment/Interventions  ADLs/Self Care Home Management;Cryotherapy;Electrical Stimulation;Moist Heat;Iontophoresis '4mg'$ /ml Dexamethasone;Gait training;Therapeutic exercise;Therapeutic activities;Patient/family education;Manual techniques;Traction    PT Next Visit Plan  right hip flexor stretch, L shoulder RC strengthening        Patient will benefit from skilled therapeutic intervention in order to improve the following deficits and impairments:  Abnormal gait, Decreased mobility, Impaired flexibility, Improper body mechanics, Pain, Increased muscle spasms, Increased fascial restricitons, Difficulty walking, Decreased range of motion  Visit Diagnosis: Acute bilateral low back pain with left-sided sciatica  Pain in left hip  Acute pain of left shoulder  Pain in right hip     Problem List Patient Active Problem List   Diagnosis Date Noted  . Lumbar radiculopathy 09/24/2018  . Foraminal stenosis of lumbar region 09/24/2018  .  Spondylosis without myelopathy or radiculopathy, lumbar region 08/29/2016  . Chronic left-sided low back pain without sciatica 08/29/2016    Scot Jun 01/07/2019, 10:38 AM  [place] [place], [place], [address] Phone: [phone]   Fax:  [phone]  Name: Ricardo Stewart MRN: [id number] Date of Birth: 20-Oct-1945

## 2019-01-08 ENCOUNTER — Ambulatory Visit: Payer: Medicare HMO | Admitting: Physical Therapy

## 2019-01-09 ENCOUNTER — Ambulatory Visit: Payer: Medicare HMO | Admitting: Physical Therapy

## 2019-01-09 DIAGNOSIS — I10 Essential (primary) hypertension: Secondary | ICD-10-CM | POA: Diagnosis not present

## 2019-01-09 DIAGNOSIS — M545 Low back pain: Secondary | ICD-10-CM | POA: Diagnosis not present

## 2019-01-09 DIAGNOSIS — E7800 Pure hypercholesterolemia, unspecified: Secondary | ICD-10-CM | POA: Diagnosis not present

## 2019-01-09 DIAGNOSIS — J302 Other seasonal allergic rhinitis: Secondary | ICD-10-CM | POA: Diagnosis not present

## 2019-01-14 ENCOUNTER — Encounter: Payer: Self-pay | Admitting: Physical Therapy

## 2019-01-14 ENCOUNTER — Ambulatory Visit: Payer: Medicare HMO | Attending: Physical Medicine and Rehabilitation | Admitting: Physical Therapy

## 2019-01-14 ENCOUNTER — Other Ambulatory Visit: Payer: Self-pay

## 2019-01-14 DIAGNOSIS — M5442 Lumbago with sciatica, left side: Secondary | ICD-10-CM | POA: Insufficient documentation

## 2019-01-14 DIAGNOSIS — M25552 Pain in left hip: Secondary | ICD-10-CM | POA: Insufficient documentation

## 2019-01-14 DIAGNOSIS — M25512 Pain in left shoulder: Secondary | ICD-10-CM | POA: Diagnosis not present

## 2019-01-14 DIAGNOSIS — M25551 Pain in right hip: Secondary | ICD-10-CM | POA: Insufficient documentation

## 2019-01-14 NOTE — Therapy (Signed)
[address] [place], [place], [address] Phone: [phone]   Fax:  [phone] Progress Note Reporting Period 2/28/20to 01/14/19 for visits 11-20  See note below for Objective Data and Assessment of Progress/Goals.      Physical Therapy Treatment  Patient Details  Name: Ricardo Stewart MRN: [id number] Date of Birth: August 03, 1946 Referring Provider (PT): Laurence Spates   Encounter Date: 01/14/2019  PT End of Session - 01/14/19 1108    Visit Number  20    Date for PT Re-Evaluation  01/28/19    PT Start Time  1013    PT Stop Time  1108    PT Time Calculation (min)  55 min    Activity Tolerance  Patient tolerated treatment well    Behavior During Therapy  [facility] for tasks assessed/performed       History reviewed. No pertinent past medical history.  History reviewed. No pertinent surgical history.  There were no vitals filed for this visit.  Subjective Assessment - 01/14/19 1105    Subjective  Patient continues to have hip pain, reports that his back and shoulder are feeling better.  Was able to move some plants.    Currently in Pain?  Yes    Pain Score  4     Pain Location  Hip    Pain Orientation  Right;Anterior    Aggravating Factors   walking                       [facility] Adult PT Treatment/Exercise - 01/14/19 0001      Ambulation/Gait   Gait Comments  patient with antalgic gait on the right, really increased from last week      Modalities   Modalities  Electrical Stimulation;Moist Heat;Traction      Moist Heat Therapy   Number Minutes Moist Heat  15 Minutes    Moist Heat Location  Hip      Electrical Stimulation   Electrical Stimulation Location  right anterior hip    Electrical Stimulation Action  IFC    Electrical Stimulation Parameters  supine    Electrical Stimulation Goals  Pain      Ultrasound   Ultrasound Location  right anterior hip    Ultrasound Parameters  1MHz 100%     Ultrasound Goals  Pain      Manual Therapy   Manual Therapy  Passive ROM    Passive ROM  to the hips with focus on the right, especially piriformis, hip flexor,quad and adductors to help with pain and gait               PT Short Term Goals - 10/05/18 0916      PT SHORT TERM GOAL #1   Title  independent with HEP    Status  Achieved        PT Long Term Goals - 01/14/19 1110      PT LONG TERM GOAL #1   Title  report no pain when getting up in the AM    Status  Partially Met      PT LONG TERM GOAL #2   Title  walk without issue his 3-4 miles    Status  Partially Met            Plan - 01/14/19 1108    Clinical Impression Statement  Patient now with mostly right hip flexor pain, unsure of a specific cause, he is limping significantly  with decreased toe off due to pain with the hip extension.  Has pretty good pain with piriformis, adductor, hip flexor and quad stretching    PT Next Visit Plan  right hip flexor stretch, L shoulder RC strengthening     Consulted and Agree with Plan of Care  Patient       Patient will benefit from skilled therapeutic intervention in order to improve the following deficits and impairments:  Abnormal gait, Decreased mobility, Impaired flexibility, Improper body mechanics, Pain, Increased muscle spasms, Increased fascial restricitons, Difficulty walking, Decreased range of motion  Visit Diagnosis: Acute bilateral low back pain with left-sided sciatica  Pain in left hip  Acute pain of left shoulder  Pain in right hip     Problem List Patient Active Problem List   Diagnosis Date Noted  . Lumbar radiculopathy 09/24/2018  . Foraminal stenosis of lumbar region 09/24/2018  . Spondylosis without myelopathy or radiculopathy, lumbar region 08/29/2016  . Chronic left-sided low back pain without sciatica 08/29/2016    Sumner Boast., PT 01/14/2019, 11:10 AM  [address] [place], [place], [address] Phone: [phone]   Fax:  [phone]  Name: Ricardo Stewart MRN: [id number] Date of Birth: 1946-04-25

## 2019-01-21 ENCOUNTER — Other Ambulatory Visit: Payer: Self-pay

## 2019-01-21 ENCOUNTER — Encounter: Payer: Self-pay | Admitting: Physical Therapy

## 2019-01-21 ENCOUNTER — Ambulatory Visit: Payer: Medicare HMO | Admitting: Physical Therapy

## 2019-01-21 DIAGNOSIS — M5442 Lumbago with sciatica, left side: Secondary | ICD-10-CM | POA: Diagnosis not present

## 2019-01-21 DIAGNOSIS — M25552 Pain in left hip: Secondary | ICD-10-CM

## 2019-01-21 DIAGNOSIS — M25551 Pain in right hip: Secondary | ICD-10-CM

## 2019-01-21 DIAGNOSIS — M25512 Pain in left shoulder: Secondary | ICD-10-CM | POA: Diagnosis not present

## 2019-01-21 NOTE — Therapy (Signed)
[place] [address] [place], [place], [address] Phone: [phone]   Fax:  [phone]  Physical Therapy Treatment  Patient Details  Name: Ricardo Stewart MRN: [id number] Date of Birth: [birth year] Referring Provider (PT): Laurence Spates   Encounter Date: 01/21/2019  PT End of Session - 01/21/19 1055    Visit Number  21    Date for PT Re-Evaluation  01/28/19    PT Start Time  6812    PT Stop Time  1104    PT Time Calculation (min)  49 min    Activity Tolerance  Patient tolerated treatment well    Behavior During Therapy  [facility] for tasks assessed/performed       History reviewed. No pertinent past medical history.  History reviewed. No pertinent surgical history.  There were no vitals filed for this visit.  Subjective Assessment - 01/21/19 1018    Subjective  Patient reports that the right hip flexor is still really bothering him, "can't get it to let go", the last treatment helped that day , but I limp to avoid pain    Currently in Pain?  Yes    Pain Score  4     Pain Location  Hip    Pain Orientation  Right;Anterior    Aggravating Factors   walking                       [facility] Adult PT Treatment/Exercise - 01/21/19 0001      Lumbar Exercises: Aerobic   Nustep  level 4 x 6 minutes      Lumbar Exercises: Standing   Other Standing Lumbar Exercises  back to wall hip flexion with knee flexion, then hip flexion with knee straight, hip extension      Moist Heat Therapy   Number Minutes Moist Heat  15 Minutes    Moist Heat Location  Hip      Electrical Stimulation   Electrical Stimulation Location  right anterior/lateral hip    Electrical Stimulation Action  IFC    Electrical Stimulation Parameters  supine    Electrical Stimulation Goals  Pain      Iontophoresis   Type of Iontophoresis  Dexamethasone    Location  right hip flexore/TFL    Dose  51m    Time  4 hour patch      Manual Therapy    Manual Therapy  Soft tissue mobilization    Soft tissue mobilization  use of a ball for STM to the right TFL, ITB area               PT Short Term Goals - 10/05/18 07517     PT SHORT TERM GOAL #1   Title  independent with HEP    Status  Achieved        PT Long Term Goals - 01/14/19 1110      PT LONG TERM GOAL #1   Title  report no pain when getting up in the AM    Status  Partially Met      PT LONG TERM GOAL #2   Title  walk without issue his 3-4 miles    Status  Partially Met            Plan - 01/21/19 1056    Clinical Impression Statement  Patient continues to have the right hip flexor pain and has a significant limp on the right and shortens his  stride length, he seems to have some pain today that is more lateral in the TFL and the ITB.  He is having a difficult time with finding a stretch that works for him.      PT Next Visit Plan  see if we can get him walking better    Consulted and Agree with Plan of Care  Patient       Patient will benefit from skilled therapeutic intervention in order to improve the following deficits and impairments:  Abnormal gait, Decreased mobility, Impaired flexibility, Improper body mechanics, Pain, Increased muscle spasms, Increased fascial restricitons, Difficulty walking, Decreased range of motion  Visit Diagnosis: Acute bilateral low back pain with left-sided sciatica  Pain in left hip  Acute pain of left shoulder  Pain in right hip     Problem List Patient Active Problem List   Diagnosis Date Noted  . Lumbar radiculopathy 09/24/2018  . Foraminal stenosis of lumbar region 09/24/2018  . Spondylosis without myelopathy or radiculopathy, lumbar region 08/29/2016  . Chronic left-sided low back pain without sciatica 08/29/2016    Sumner Boast., PT 01/21/2019, 11:00 AM  [place] [place], [place], [address] Phone: [phone]    Fax:  [phone]  Name: Ricardo Stewart MRN: [id number] Date of Birth: 1946-06-30

## 2019-01-28 ENCOUNTER — Ambulatory Visit: Payer: Medicare HMO | Admitting: Physical Therapy

## 2019-01-28 ENCOUNTER — Other Ambulatory Visit: Payer: Self-pay

## 2019-01-28 ENCOUNTER — Encounter: Payer: Self-pay | Admitting: Physical Therapy

## 2019-01-28 DIAGNOSIS — M25552 Pain in left hip: Secondary | ICD-10-CM

## 2019-01-28 DIAGNOSIS — M25512 Pain in left shoulder: Secondary | ICD-10-CM

## 2019-01-28 DIAGNOSIS — M5442 Lumbago with sciatica, left side: Secondary | ICD-10-CM | POA: Diagnosis not present

## 2019-01-28 DIAGNOSIS — M25551 Pain in right hip: Secondary | ICD-10-CM

## 2019-01-28 NOTE — Therapy (Signed)
[place], [place], [address] Phone: [phone]   Fax:  [phone]  Physical Therapy Treatment  Patient Details  Name: Ricardo Stewart MRN: [id number] Date of Birth: Oct 07, 1945 Referring Provider (PT): Laurence Spates   Encounter Date: 01/28/2019  PT End of Session - 01/28/19 1136    Visit Number  22    Date for PT Re-Evaluation  01/28/19    PT Start Time  1058    PT Stop Time  1147    PT Time Calculation (min)  49 min    Activity Tolerance  Patient tolerated treatment well    Behavior During Therapy  [facility] for tasks assessed/performed       History reviewed. No pertinent past medical history.  History reviewed. No pertinent surgical history.  There were no vitals filed for this visit.  Subjective Assessment - 01/28/19 1135    Subjective  Patient continues to limp (seems to be worse than last week), he has pain anterior and lateral in the hip    Currently in Pain?  Yes    Pain Score  5     Pain Location  Hip    Pain Orientation  Right;Anterior;Lateral    Aggravating Factors   unsrue, thinks he is walking different    Pain Relieving Factors  treatment helps for aobut a day or two                       [facility] Adult PT Treatment/Exercise - 01/28/19 0001      Moist Heat Therapy   Number Minutes Moist Heat  15 Minutes    Moist Heat Location  Hip      Electrical Stimulation   Electrical Stimulation Location  right anterior/lateral hip    Electrical Stimulation Action  IFC    Electrical Stimulation Parameters  supine    Electrical Stimulation Goals  Pain      Manual Therapy   Manual Therapy  Soft tissue mobilization    Soft tissue mobilization  use of a ball for STM to the right TFL, ITB area    Passive ROM  to the hips with focus on the right, especially piriformis, hip flexor,quad and adductors to help with pain and gait               PT Short Term Goals -  10/05/18 0916      PT SHORT TERM GOAL #1   Title  independent with HEP    Status  Achieved        PT Long Term Goals - 01/14/19 1110      PT LONG TERM GOAL #1   Title  report no pain when getting up in the AM    Status  Partially Met      PT LONG TERM GOAL #2   Title  walk without issue his 3-4 miles    Status  Partially Met            Plan - 01/28/19 1137    Clinical Impression Statement  Patient continues to have the increased pain in the anterior and lateral hip, he reports that maybe he is doing too much, have focused the last two visits on working on the TFL, hip flexor and ITB area.  He is very tender here, has a significant antalgic gait on the right    PT Treatment/Interventions  ADLs/Self Care Home Management;Cryotherapy;Electrical Stimulation;Moist Heat;Iontophoresis '4mg'$ /ml Dexamethasone;Gait training;Therapeutic exercise;Therapeutic  activities;Patient/family education;Manual techniques;Traction;Dry needling    PT Next Visit Plan  may try DN    Consulted and Agree with Plan of Care  Patient       Patient will benefit from skilled therapeutic intervention in order to improve the following deficits and impairments:  Abnormal gait, Decreased mobility, Impaired flexibility, Improper body mechanics, Pain, Increased muscle spasms, Increased fascial restricitons, Difficulty walking, Decreased range of motion  Visit Diagnosis: Acute bilateral low back pain with left-sided sciatica  Pain in left hip  Acute pain of left shoulder  Pain in right hip     Problem List Patient Active Problem List   Diagnosis Date Noted  . Lumbar radiculopathy 09/24/2018  . Foraminal stenosis of lumbar region 09/24/2018  . Spondylosis without myelopathy or radiculopathy, lumbar region 08/29/2016  . Chronic left-sided low back pain without sciatica 08/29/2016    Sumner Boast., PT 01/28/2019, 11:38 AM  [address] [address] [place], [place], [address] Phone: [phone]   Fax:  [phone]  Name: Ricardo Stewart MRN: [id number] Date of Birth: [birth year]

## 2019-02-05 ENCOUNTER — Other Ambulatory Visit: Payer: Self-pay

## 2019-02-05 ENCOUNTER — Encounter: Payer: Self-pay | Admitting: Physical Therapy

## 2019-02-05 ENCOUNTER — Ambulatory Visit: Payer: Medicare HMO | Admitting: Physical Therapy

## 2019-02-05 DIAGNOSIS — M25551 Pain in right hip: Secondary | ICD-10-CM

## 2019-02-05 DIAGNOSIS — M5442 Lumbago with sciatica, left side: Secondary | ICD-10-CM

## 2019-02-05 DIAGNOSIS — M25552 Pain in left hip: Secondary | ICD-10-CM

## 2019-02-05 DIAGNOSIS — M25512 Pain in left shoulder: Secondary | ICD-10-CM | POA: Diagnosis not present

## 2019-02-05 NOTE — Therapy (Signed)
Ricardo Stewart, [place], [address] Phone: [phone]   Fax:  [phone]  Physical Therapy Treatment  Patient Details  Name: Ricardo Stewart MRN: [id number] Date of Birth: [birth year] Referring Provider (PT): Laurence Spates   Encounter Date: 02/05/2019  PT End of Session - 02/05/19 1050    Visit Number  23    Date for PT Re-Evaluation  03/02/19    PT Start Time  0300    PT Stop Time  1114    PT Time Calculation (min)  59 min    Activity Tolerance  Patient tolerated treatment well    Behavior During Therapy  [facility] for tasks assessed/performed       History reviewed. No pertinent past medical history.  History reviewed. No pertinent surgical history.  There were no vitals filed for this visit.  Subjective Assessment - 02/05/19 1049    Subjective  Patient reports that the day I saw him last was great but reports maybe too much, he reports having much more pain the next day or so    Currently in Pain?  Yes    Pain Score  6     Pain Location  Hip    Pain Orientation  Right;Anterior         [facility] PT Assessment - 02/05/19 0001      Assessment   Medical Diagnosis  lumbar radiculopathy, foraminal stenosis, left ischial bursitis    Referring Provider (PT)  Laurence Spates                   [facility] Adult PT Treatment/Exercise - 02/05/19 0001      Moist Heat Therapy   Number Minutes Moist Heat  15 Minutes    Moist Heat Location  Hip      Electrical Stimulation   Electrical Stimulation Location  right anterior/lateral hip    Electrical Stimulation Action  IFC    Electrical Stimulation Parameters  supine    Electrical Stimulation Goals  Pain      Ultrasound   Ultrasound Location  right anterior hip flexor area    Ultrasound Parameters  1MHz 100% 1.5w/cm2    Ultrasound Goals  Pain      Iontophoresis   Type of Iontophoresis  Dexamethasone    Location  right hip flexore/TFL    Dose  73m     Time  4 hour patch      Manual Therapy   Manual Therapy  Soft tissue mobilization    Soft tissue mobilization  use of vibrator to the hip flexor, ITB and gluteal area               PT Short Term Goals - 10/05/18 0916      PT SHORT TERM GOAL #1   Title  independent with HEP    Status  Achieved        PT Long Term Goals - 02/05/19 1206      PT LONG TERM GOAL #1   Title  report no pain when getting up in the AM    Status  Partially Met      PT LONG TERM GOAL #2   Title  walk without issue his 3-4 miles    Status  Partially Met            Plan - 02/05/19 1201    Clinical Impression Statement  Patient really is limited due to the right hip area causing pain,  he has difficulty walking due to the limp and the pain, we are trying a lot of different treatments to try to address this but he has ups and downs with his pain and his ability to walk, he is going to limit his walking and try to not sleep on his right side    PT Next Visit Plan  asked him to return to MD, we  will see him until he gets the appointment    Consulted and Agree with Plan of Care  Patient       Patient will benefit from skilled therapeutic intervention in order to improve the following deficits and impairments:  Abnormal gait, Decreased mobility, Impaired flexibility, Improper body mechanics, Pain, Increased muscle spasms, Increased fascial restricitons, Difficulty walking, Decreased range of motion  Visit Diagnosis: Acute bilateral low back pain with left-sided sciatica - Plan: PT plan of care cert/re-cert  Pain in left hip - Plan: PT plan of care cert/re-cert  Pain in right hip - Plan: PT plan of care cert/re-cert     Problem List Patient Active Problem List   Diagnosis Date Noted  . Lumbar radiculopathy 09/24/2018  . Foraminal stenosis of lumbar region 09/24/2018  . Spondylosis without myelopathy or radiculopathy, lumbar region 08/29/2016  . Chronic left-sided low back pain without  sciatica 08/29/2016    Sumner Boast., PT 02/05/2019, 12:32 PM  [address] [place], [place], [address] Phone: [phone]   Fax:  [phone]  Name: Ricardo Stewart MRN: [id number] Date of Birth: December 20, 1945

## 2019-02-06 ENCOUNTER — Telehealth: Payer: Self-pay | Admitting: Physical Medicine and Rehabilitation

## 2019-02-06 NOTE — Telephone Encounter (Signed)
OV but if really strain may be more appropriate for Dr. Prince Rome, he has seen him in the past I think?  See if he has seen him on SRS or remind me

## 2019-02-07 ENCOUNTER — Other Ambulatory Visit: Payer: Self-pay

## 2019-02-07 ENCOUNTER — Ambulatory Visit: Payer: Medicare HMO | Admitting: Physical Therapy

## 2019-02-07 ENCOUNTER — Encounter: Payer: Self-pay | Admitting: Physical Therapy

## 2019-02-07 DIAGNOSIS — M25551 Pain in right hip: Secondary | ICD-10-CM | POA: Diagnosis not present

## 2019-02-07 DIAGNOSIS — M5442 Lumbago with sciatica, left side: Secondary | ICD-10-CM | POA: Diagnosis not present

## 2019-02-07 DIAGNOSIS — M25552 Pain in left hip: Secondary | ICD-10-CM | POA: Diagnosis not present

## 2019-02-07 DIAGNOSIS — M25512 Pain in left shoulder: Secondary | ICD-10-CM

## 2019-02-07 NOTE — Therapy (Signed)
[place], [place], [address] Phone: [phone]   Fax:  [phone]  Physical Therapy Treatment  Patient Details  Name: Hilton Saephan MRN: [id number] Date of Birth: 1945-12-23 Referring Provider (PT): Laurence Spates   Encounter Date: 02/07/2019  PT End of Session - 02/07/19 1157    Visit Number  24    Date for PT Re-Evaluation  03/02/19    PT Start Time  0930    PT Stop Time  1016    PT Time Calculation (min)  46 min    Activity Tolerance  Patient tolerated treatment well    Behavior During Therapy  [facility] for tasks assessed/performed       History reviewed. No pertinent past medical history.  History reviewed. No pertinent surgical history.  There were no vitals filed for this visit.  Subjective Assessment - 02/07/19 1154    Subjective  Patient reports that he is feeling better and thinks that we have hit on something that is helping    Currently in Pain?  Yes    Pain Score  3     Pain Location  Groin    Pain Orientation  Right    Aggravating Factors   pain a little more in the groin today, unsure of why                       [facility] Adult PT Treatment/Exercise - 02/07/19 0001      Moist Heat Therapy   Number Minutes Moist Heat  15 Minutes    Moist Heat Location  Hip      Electrical Stimulation   Electrical Stimulation Location  right anterior hip and groin area    Electrical Stimulation Action  IFC    Electrical Stimulation Parameters  supine    Electrical Stimulation Goals  Pain      Ultrasound   Ultrasound Location  right anterior hip and into the groin    Ultrasound Parameters  100% 1MHz 1.4 w/cm2    Ultrasound Goals  Pain      Manual Therapy   Manual Therapy  Soft tissue mobilization    Soft tissue mobilization  use of vibrator to the hip flexor, ITB and gluteal area and into the right groin area    Passive ROM  to the right hip stretches in all motions of the  right hip               PT Short Term Goals - 10/05/18 0916      PT SHORT TERM GOAL #1   Title  independent with HEP    Status  Achieved        PT Long Term Goals - 02/07/19 1158      PT LONG TERM GOAL #1   Title  report no pain when getting up in the AM    Status  Partially Met      PT LONG TERM GOAL #2   Title  walk without issue his 3-4 miles    Status  Partially Met            Plan - 02/07/19 1157    Clinical Impression Statement  Patient having pain more in the right groin today, the hip musculature is very tight and he has very difficult time moving the hip and stretching.  He feels like the last treatment really helped    PT Next Visit Plan  see how  today went, he is walking better    Consulted and Agree with Plan of Care  Patient       Patient will benefit from skilled therapeutic intervention in order to improve the following deficits and impairments:  Abnormal gait, Decreased mobility, Impaired flexibility, Improper body mechanics, Pain, Increased muscle spasms, Increased fascial restricitons, Difficulty walking, Decreased range of motion  Visit Diagnosis: Acute bilateral low back pain with left-sided sciatica  Pain in left hip  Pain in right hip  Acute pain of left shoulder     Problem List Patient Active Problem List   Diagnosis Date Noted  . Lumbar radiculopathy 09/24/2018  . Foraminal stenosis of lumbar region 09/24/2018  . Spondylosis without myelopathy or radiculopathy, lumbar region 08/29/2016  . Chronic left-sided low back pain without sciatica 08/29/2016    Sumner Boast., PT 02/07/2019, 11:59 AM  [address] [place], [place], [address] Phone: [phone]   Fax:  [phone]  Name: Aniel Hubble MRN: [id number] Date of Birth: 01-11-1946

## 2019-02-12 ENCOUNTER — Telehealth: Payer: Self-pay

## 2019-02-12 NOTE — Telephone Encounter (Signed)
Patient Ricardo Stewart asking for appt with Dr Alvester Morin for hip flexor pain  [phone]

## 2019-02-12 NOTE — Telephone Encounter (Signed)
See previous message in my inbox. Have not had a chance to call patient back from last week.

## 2019-02-13 ENCOUNTER — Encounter: Payer: Self-pay | Admitting: Family Medicine

## 2019-02-13 ENCOUNTER — Ambulatory Visit (INDEPENDENT_AMBULATORY_CARE_PROVIDER_SITE_OTHER): Payer: Medicare HMO | Admitting: Family Medicine

## 2019-02-13 ENCOUNTER — Other Ambulatory Visit: Payer: Self-pay

## 2019-02-13 ENCOUNTER — Ambulatory Visit: Payer: Medicare HMO | Attending: Physical Medicine and Rehabilitation | Admitting: Physical Therapy

## 2019-02-13 ENCOUNTER — Encounter: Payer: Self-pay | Admitting: Physical Therapy

## 2019-02-13 ENCOUNTER — Ambulatory Visit (INDEPENDENT_AMBULATORY_CARE_PROVIDER_SITE_OTHER): Payer: Medicare HMO

## 2019-02-13 DIAGNOSIS — M25551 Pain in right hip: Secondary | ICD-10-CM | POA: Diagnosis not present

## 2019-02-13 DIAGNOSIS — M5442 Lumbago with sciatica, left side: Secondary | ICD-10-CM

## 2019-02-13 DIAGNOSIS — M25512 Pain in left shoulder: Secondary | ICD-10-CM | POA: Diagnosis not present

## 2019-02-13 DIAGNOSIS — M25552 Pain in left hip: Secondary | ICD-10-CM

## 2019-02-13 MED ORDER — METHYLPREDNISOLONE 4 MG PO TBPK: ORAL_TABLET | ORAL | 0 refills | Status: DC

## 2019-02-13 MED ORDER — NABUMETONE 750 MG PO TABS: 750.0000 mg | ORAL_TABLET | Freq: Two times a day (BID) | ORAL | 6 refills | Status: DC | PRN

## 2019-02-13 NOTE — Telephone Encounter (Signed)
Scheduled for 1400 with Dr. Prince Rome.

## 2019-02-13 NOTE — Progress Notes (Signed)
Office Visit Note   [name]           Date of Birth: 1946-01-10           MRN: [id number] Visit Date: 02/13/2019 Requested by: Sigmund HazelMiller, Lisa, MD [address] [place], [place] [address] PCP: Sigmund HazelMiller, Lisa, MD  Subjective: Chief Complaint  Patient presents with   Right Hip - Pain    Groin pain x 5 weeks. Been doing PT - had a session this morning. Been walking or running every day x 40 years. This pain started while walking.    HPI: He is here with right groin pain.  Symptoms started about 5 weeks ago during walking.  He had been walking about 20 miles per week but decided to increase his mileage to 23 miles per week.  At some point he started noticing pain in the groin.  He did not increase his mileage any further and he continued to have pain.  He started working with his physical therapist in about 2 weeks ago he stopped his exercise altogether because his pain was not improving.  Physical therapy helps but only temporarily.  He has pain with walking downhill, even pain at rest.  He takes ibuprofen chronically but it does not seem to be helping his current pain.  No history of stress fracture.  He is on vitamin D but does not have a history of deficiency to his knowledge.  Denies any popping or locking in his hip.  He has a history of low back problems but is doing well after his last epidural injection.              ROS: No fevers or chills or respiratory symptoms.  All other systems were reviewed and are negative.  Objective: Vital Signs: There were no vitals taken for this visit.  Physical Exam:  General:  Alert and oriented, in no acute distress. Pulm:  Breathing unlabored. Psy:  Normal mood, congruent affect. Skin: No rash on his skin. Right hip: He has slight tenderness to deep palpation of the hip flexor area, but there is no 1 point that seems to reproduce all of his pain.  No tenderness over the greater trochanter.  No palpable inguinal hernia.  Moderate pain  with hip flexion against resistance, moderate pain with hip external rotation against resistance.  Moderate pain with passive hip flexion and internal rotation.  Still has good range of motion compared to the left.  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  Moderate joint space narrowing on the right compared to fairly normal joint space on the left.  I do not see a definite stress fracture.  Musculoskeletal ultrasound: No significant joint effusion detected.  I do not see any hip flexor or adductor tendon tears or muscle tears.   Assessment & Plan: 1.  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  Cannot completely rule out stress fracture. -Discussed options with him and elected to try a short course of oral steroid followed by a different anti-inflammatory.  He will try glucosamine as well.  If symptoms do not improve, options would include MRI scan versus diagnostic/therapeutic intra-articular injection.     Procedures: No procedures performed  No notes on file     PMFS History: Patient Active Problem List   Diagnosis Date Noted   Lumbar radiculopathy 09/24/2018   Foraminal stenosis of lumbar region 09/24/2018   Spondylosis without myelopathy or radiculopathy, lumbar region  08/29/2016   Chronic left-sided low back pain without sciatica 08/29/2016   History reviewed. No pertinent past medical history.  History reviewed. No pertinent family history.  History reviewed. No pertinent surgical history. Social History   Occupational History   Not on file  Tobacco Use   Smoking status: Never Smoker   Smokeless tobacco: Never Used  Substance and Sexual Activity   Alcohol use: Not on file   Drug use: Not on file   Sexual activity: Not on file

## 2019-02-13 NOTE — Therapy (Signed)
[place], [place], [address] Phone: [phone]   Fax:  [phone]  Physical Therapy Treatment  Patient Details  Name: Teddrick Mallari MRN: [id number] Date of Birth: May 03, 1946 Referring Provider (PT): Laurence Spates   Encounter Date: 02/13/2019  PT End of Session - 02/13/19 1059    Visit Number  25    Date for PT Re-Evaluation  03/02/19    PT Start Time  3016    PT Stop Time  1100    PT Time Calculation (min)  45 min    Activity Tolerance  Patient tolerated treatment well    Behavior During Therapy  [facility] for tasks assessed/performed       History reviewed. No pertinent past medical history.  History reviewed. No pertinent surgical history.  There were no vitals filed for this visit.  Subjective Assessment - 02/13/19 1050    Subjective  Patient reports that he is still having right anterior hip and groin pain.  Worse after sitting and then trying to get up and walk    Currently in Pain?  Yes    Pain Score  4     Pain Location  Groin    Pain Orientation  Right    Pain Descriptors / Indicators  Aching;Sore    Aggravating Factors   walking    Pain Relieving Factors  gets some relief from out treatment for a day or two                       [facility] Adult PT Treatment/Exercise - 02/13/19 0001      Lumbar Exercises: Stretches   Passive Hamstring Stretch  4 reps;20 seconds    Hip Flexor Stretch  Right;4 reps;20 seconds    Piriformis Stretch  4 reps;20 seconds    Other Lumbar Stretch Exercise  quad stretch      Moist Heat Therapy   Number Minutes Moist Heat  15 Minutes    Moist Heat Location  Hip      Electrical Stimulation   Electrical Stimulation Location  through the psoas on the right    Electrical Stimulation Action  IFC    Electrical Stimulation Parameters  supine    Electrical Stimulation Goals  Pain      Ultrasound   Ultrasound Location  right anterior hip and groin     Ultrasound Parameters  100% 1MHz 1.5w/cm2    Ultrasound Goals  Pain      Manual Therapy   Manual Therapy  Myofascial release    Myofascial Release  myofascial release of the right psoas and some into the groin, very tender in spots here               PT Short Term Goals - 10/05/18 0916      PT SHORT TERM GOAL #1   Title  independent with HEP    Status  Achieved        PT Long Term Goals - 02/13/19 1103      PT LONG TERM GOAL #1   Title  report no pain when getting up in the AM    Status  Partially Met      PT LONG TERM GOAL #2   Title  walk without issue his 3-4 miles    Status  Partially Met      PT LONG TERM GOAL #3   Title  increase SLR to 60 degrees  Status  Partially Met      PT LONG TERM GOAL #4   Title  understand posture and body mechanics    Status  Achieved            Plan - 02/13/19 1059    Clinical Impression Statement  Patient was originally seen here for left low back pain and shoulder issue.  These have seemed to resolve with treatment, over the past month or so he has really had most issues with right anterior hip and groin pain.  We have tried various treatments, including estim, STM and passive stretch, used iontophoresis, he gets 1 day to 2 days of relief with our treatment but it comes back.  He is very tight in the LE's but is better now than when we started but again very tight.  His normal exercise is to walk 3 miles a day, he has decreased this.    PT Next Visit Plan  he will be seeing a MD today for this and we would appreciate any guidance       Patient will benefit from skilled therapeutic intervention in order to improve the following deficits and impairments:  Abnormal gait, Decreased mobility, Impaired flexibility, Improper body mechanics, Pain, Increased muscle spasms, Increased fascial restricitons, Difficulty walking, Decreased range of motion  Visit Diagnosis: Acute bilateral low back pain with left-sided  sciatica  Pain in left hip  Pain in right hip     Problem List Patient Active Problem List   Diagnosis Date Noted  . Lumbar radiculopathy 09/24/2018  . Foraminal stenosis of lumbar region 09/24/2018  . Spondylosis without myelopathy or radiculopathy, lumbar region 08/29/2016  . Chronic left-sided low back pain without sciatica 08/29/2016    Sumner Boast., PT 02/13/2019, 11:03 AM  [address] [place], [place], [address] Phone: [phone]   Fax:  [phone]  Name: Kallan Merrick MRN: [id number] Date of Birth: 08/23/1946

## 2019-02-15 ENCOUNTER — Encounter: Payer: Self-pay | Admitting: Physical Therapy

## 2019-02-15 ENCOUNTER — Ambulatory Visit: Payer: Medicare HMO | Admitting: Physical Therapy

## 2019-02-15 ENCOUNTER — Other Ambulatory Visit: Payer: Self-pay

## 2019-02-15 DIAGNOSIS — M25551 Pain in right hip: Secondary | ICD-10-CM

## 2019-02-15 DIAGNOSIS — M25552 Pain in left hip: Secondary | ICD-10-CM | POA: Diagnosis not present

## 2019-02-15 DIAGNOSIS — M25512 Pain in left shoulder: Secondary | ICD-10-CM | POA: Diagnosis not present

## 2019-02-15 DIAGNOSIS — M5442 Lumbago with sciatica, left side: Secondary | ICD-10-CM

## 2019-02-15 NOTE — Therapy (Signed)
[place] [address] [place], [place], [address] Phone: [phone]   Fax:  [phone]  Physical Therapy Treatment  Patient Details  Name: Ricardo Stewart MRN: [id number] Date of Birth: 06-06-1946 Referring Provider (PT): Laurence Spates   Encounter Date: 02/15/2019  PT End of Session - 02/15/19 1054    Visit Number  26    Date for PT Re-Evaluation  03/02/19    PT Start Time  7096    PT Stop Time  1108    PT Time Calculation (min)  53 min    Activity Tolerance  Patient tolerated treatment well    Behavior During Therapy  [facility] for tasks assessed/performed       History reviewed. No pertinent past medical history.  History reviewed. No pertinent surgical history.  There were no vitals filed for this visit.  Subjective Assessment - 02/15/19 1019    Subjective  Saw MD yesterday, started glucosamine, started prednisone, MD feels like it is arthritis    Currently in Pain?  Yes    Pain Score  4     Pain Location  Groin    Pain Orientation  Right    Aggravating Factors   walking                       [facility] Adult PT Treatment/Exercise - 02/15/19 0001      Lumbar Exercises: Stretches   Passive Hamstring Stretch  4 reps;20 seconds    Hip Flexor Stretch  Right;4 reps;20 seconds    Piriformis Stretch  4 reps;20 seconds    Other Lumbar Stretch Exercise  quad stretch      Lumbar Exercises: Aerobic   Recumbent Bike  x 6 minutes      Lumbar Exercises: Machines for Strengthening   Cybex Knee Extension  10# 2x10    Cybex Knee Flexion  25# 2x10      Moist Heat Therapy   Number Minutes Moist Heat  15 Minutes    Moist Heat Location  Hip      Electrical Stimulation   Electrical Stimulation Location  through the psoas on the right    Electrical Stimulation Action  IFC    Electrical Stimulation Parameters  supine    Electrical Stimulation Goals  Pain      Ultrasound   Ultrasound Location  right anterior  and lateral hip    Ultrasound Parameters  100% 1.5 w/cm2    Ultrasound Goals  Pain               PT Short Term Goals - 10/05/18 0916      PT SHORT TERM GOAL #1   Title  independent with HEP    Status  Achieved        PT Long Term Goals - 02/13/19 1103      PT LONG TERM GOAL #1   Title  report no pain when getting up in the AM    Status  Partially Met      PT LONG TERM GOAL #2   Title  walk without issue his 3-4 miles    Status  Partially Met      PT LONG TERM GOAL #3   Title  increase SLR to 60 degrees    Status  Partially Met      PT LONG TERM GOAL #4   Title  understand posture and body mechanics    Status  Achieved  Plan - 02/15/19 1055    Clinical Impression Statement  MD felt like the hip issue is arthritis, he started a new NSAID, added glucosamine to his vitamins and started prednisone.  He is very tight in the hip mms we will try to slowly work on flexibility    PT Next Visit Plan  work on [place] and Agree with Plan of Care  Patient       Patient will benefit from skilled therapeutic intervention in order to improve the following deficits and impairments:  Abnormal gait, Decreased mobility, Impaired flexibility, Improper body mechanics, Pain, Increased muscle spasms, Increased fascial restricitons, Difficulty walking, Decreased range of motion  Visit Diagnosis: Acute bilateral low back pain with left-sided sciatica  Pain in left hip  Pain in right hip  Acute pain of left shoulder     Problem List Patient Active Problem List   Diagnosis Date Noted  . Lumbar radiculopathy 09/24/2018  . Foraminal stenosis of lumbar region 09/24/2018  . Spondylosis without myelopathy or radiculopathy, lumbar region 08/29/2016  . Chronic left-sided low back pain without sciatica 08/29/2016    Sumner Boast., PT 02/15/2019, 11:01 AM  [place] [address]  [place], [place], [address] Phone: [phone]   Fax:  [phone]  Name: Ricardo Stewart MRN: [id number] Date of Birth: Sep 16, 1945

## 2019-02-19 ENCOUNTER — Encounter: Payer: Self-pay | Admitting: Physical Therapy

## 2019-02-19 ENCOUNTER — Telehealth: Payer: Self-pay | Admitting: Family Medicine

## 2019-02-19 ENCOUNTER — Ambulatory Visit: Payer: Medicare HMO | Admitting: Physical Therapy

## 2019-02-19 ENCOUNTER — Other Ambulatory Visit: Payer: Self-pay

## 2019-02-19 DIAGNOSIS — M25552 Pain in left hip: Secondary | ICD-10-CM

## 2019-02-19 DIAGNOSIS — M25512 Pain in left shoulder: Secondary | ICD-10-CM

## 2019-02-19 DIAGNOSIS — M25551 Pain in right hip: Secondary | ICD-10-CM

## 2019-02-19 DIAGNOSIS — M5442 Lumbago with sciatica, left side: Secondary | ICD-10-CM | POA: Diagnosis not present

## 2019-02-19 NOTE — Telephone Encounter (Signed)
Pt called in said he has finished his medication for prednisone and still happens to be in a lot of pain and would like for dr.hilts to give him a call so they can discuss pt next steps.  [phone]

## 2019-02-19 NOTE — Therapy (Signed)
[address] [place], [place], [address] Phone: [phone]   Fax:  [phone]  Physical Therapy Treatment  Patient Details  Name: Ricardo Stewart MRN: [id number] Date of Birth: Jan 22, 1946 Referring Provider (PT): Laurence Spates   Encounter Date: 02/19/2019  PT End of Session - 02/19/19 1051    Visit Number  27    Date for PT Re-Evaluation  03/02/19    PT Start Time  1012    PT Stop Time  1109    PT Time Calculation (min)  57 min    Activity Tolerance  Patient tolerated treatment well    Behavior During Therapy  [facility] for tasks assessed/performed       History reviewed. No pertinent past medical history.  History reviewed. No pertinent surgical history.  There were no vitals filed for this visit.  Subjective Assessment - 02/19/19 1019    Subjective  Patient reports he took his last prednisone today.  He reports still hurting with any walking    Currently in Pain?  Yes    Pain Score  5     Pain Location  Groin    Pain Orientation  Right    Pain Descriptors / Indicators  Aching;Sore    Aggravating Factors   any weightbearing                       [facility] Adult PT Treatment/Exercise - 02/19/19 0001      Lumbar Exercises: Stretches   Passive Hamstring Stretch  4 reps;20 seconds    Hip Flexor Stretch  Right;4 reps;20 seconds    Piriformis Stretch  4 reps;20 seconds    Other Lumbar Stretch Exercise  quad stretch      Lumbar Exercises: Aerobic   Recumbent Bike  x 6 minutes      Lumbar Exercises: Machines for Strengthening   Cybex Knee Extension  10# 2x10    Cybex Knee Flexion  25# 2x10      Moist Heat Therapy   Number Minutes Moist Heat  15 Minutes    Moist Heat Location  Hip      Electrical Stimulation   Electrical Stimulation Location  through the psoas on the right    Electrical Stimulation Action  IFC    Electrical Stimulation Parameters  supine    Electrical Stimulation Goals   Pain      Manual Therapy   Manual Therapy  Manual Traction    Manual Traction  use of mobilization belt for some MWM, some right hip joint distraction in various directions and with varied motions to create space               PT Short Term Goals - 10/05/18 0916      PT SHORT TERM GOAL #1   Title  independent with HEP    Status  Achieved        PT Long Term Goals - 02/13/19 1103      PT LONG TERM GOAL #1   Title  report no pain when getting up in the AM    Status  Partially Met      PT LONG TERM GOAL #2   Title  walk without issue his 3-4 miles    Status  Partially Met      PT LONG TERM GOAL #3   Title  increase SLR to 60 degrees    Status  Partially Met  PT LONG TERM GOAL #4   Title  understand posture and body mechanics    Status  Achieved            Plan - 02/19/19 1052    Clinical Impression Statement  Patient reports no real changes with the prednisone, I tried some MWM, joint distraction and mobilizations today to see if we could get some space in the joint and relieve some pain    PT Next Visit Plan  see if the joint distraction helped    Consulted and Agree with Plan of Care  Patient       Patient will benefit from skilled therapeutic intervention in order to improve the following deficits and impairments:  Abnormal gait, Decreased mobility, Impaired flexibility, Improper body mechanics, Pain, Increased muscle spasms, Increased fascial restricitons, Difficulty walking, Decreased range of motion  Visit Diagnosis: Acute bilateral low back pain with left-sided sciatica  Pain in left hip  Pain in right hip  Acute pain of left shoulder     Problem List Patient Active Problem List   Diagnosis Date Noted  . Lumbar radiculopathy 09/24/2018  . Foraminal stenosis of lumbar region 09/24/2018  . Spondylosis without myelopathy or radiculopathy, lumbar region 08/29/2016  . Chronic left-sided low back pain without sciatica 08/29/2016     Sumner Boast., PT 02/19/2019, 10:57 AM  [address] [place], [place], [address] Phone: [phone]   Fax:  [phone]  Name: Ricardo Stewart MRN: [id number] Date of Birth: 1946/07/29

## 2019-02-20 ENCOUNTER — Ambulatory Visit (INDEPENDENT_AMBULATORY_CARE_PROVIDER_SITE_OTHER): Payer: Medicare HMO | Admitting: Family Medicine

## 2019-02-20 ENCOUNTER — Other Ambulatory Visit: Payer: Self-pay

## 2019-02-20 ENCOUNTER — Encounter: Payer: Self-pay | Admitting: Family Medicine

## 2019-02-20 DIAGNOSIS — M25551 Pain in right hip: Secondary | ICD-10-CM

## 2019-02-20 NOTE — Progress Notes (Signed)
   Office Visit Note   [name]           Date of Birth: 07-Oct-1945           MRN: [id number] Visit Date: 02/20/2019 Requested by: Kathyrn Lass, [place], [place] [address] PCP: Kathyrn Lass, MD  Subjective: No chief complaint on file.   HPI: He is here with persistent right hip pain.  Steroid pack gave only short-term relief.  He is wondering whether an injection might help.  He rather try that before MRI scan.              ROS: No fevers or chills.  All other systems were reviewed and are negative.  Objective: Vital Signs: There were no vitals taken for this visit.  Physical Exam:  General:  Alert and oriented, in no acute distress. Pulm:  Breathing unlabored. Psy:  Normal mood, congruent affect. Skin: No rash on the skin. Still walks with a limp.  Some tenderness in the posterior hip but also groin pain with passive internal rotation.  Imaging: None today.  Assessment & Plan: 1.  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 -Discussed options and elected to try a diagnostic/therapeutic injection today.  We will do this under ultrasound guidance.  If no improvement, then he will call me and I will order MRI scan.     Procedures: Subjective: Patient is here for ultrasound-guided intra-articular right hip injection.  Procedure: Ultrasound-guided right hip injection: After sterile prep with Betadine, injected 8 cc 1% lidocaine without epinephrine and 40 mg methylprednisolone using a 22-gauge spinal needle, passing the needle through the iliofemoral ligament into the femoral head/neck junction.  Injectate was seen filling the joint capsule.  He had good immediate pain relief, including from his posterior hip pain.     PMFS History: Patient Active Problem List   Diagnosis Date Noted  . Lumbar radiculopathy 09/24/2018  . Foraminal stenosis of lumbar region 09/24/2018  . Spondylosis without  myelopathy or radiculopathy, lumbar region 08/29/2016  . Chronic left-sided low back pain without sciatica 08/29/2016   History reviewed. No pertinent past medical history.  History reviewed. No pertinent family history.  History reviewed. No pertinent surgical history. Social History   Occupational History  . Not on file  Tobacco Use  . Smoking status: Never Smoker  . Smokeless tobacco: Never Used  Substance and Sexual Activity  . Alcohol use: Not on file  . Drug use: Not on file  . Sexual activity: Not on file

## 2019-02-20 NOTE — Telephone Encounter (Signed)
Called pt, still in a lot of pain.  He'll come in this afternoon.

## 2019-02-20 NOTE — Telephone Encounter (Signed)
Ricardo Stewart. Appointment scheduled.

## 2019-02-21 ENCOUNTER — Encounter: Payer: Self-pay | Admitting: Physical Therapy

## 2019-02-21 ENCOUNTER — Ambulatory Visit: Payer: Medicare HMO | Admitting: Physical Therapy

## 2019-02-21 DIAGNOSIS — M5442 Lumbago with sciatica, left side: Secondary | ICD-10-CM | POA: Diagnosis not present

## 2019-02-21 DIAGNOSIS — M25551 Pain in right hip: Secondary | ICD-10-CM

## 2019-02-21 DIAGNOSIS — M25512 Pain in left shoulder: Secondary | ICD-10-CM | POA: Diagnosis not present

## 2019-02-21 DIAGNOSIS — M25552 Pain in left hip: Secondary | ICD-10-CM | POA: Diagnosis not present

## 2019-02-21 NOTE — Therapy (Signed)
[address] [place], [place], [address] Phone: [phone]   Fax:  [phone]  Physical Therapy Treatment  Patient Details  Name: Ricardo Stewart MRN: [id number] Date of Birth: May 31, 1946 Referring Provider (PT): Laurence Spates   Encounter Date: 02/21/2019  PT End of Session - 02/21/19 1053    Visit Number  28    Date for PT Re-Evaluation  03/02/19    PT Start Time  1013    PT Stop Time  1100    PT Time Calculation (min)  47 min    Activity Tolerance  Patient tolerated treatment well    Behavior During Therapy  [facility] for tasks assessed/performed       History reviewed. No pertinent past medical history.  History reviewed. No pertinent surgical history.  There were no vitals filed for this visit.  Subjective Assessment - 02/21/19 1013    Subjective  Patient reports that he had a cortisone injection yesterday in the anterior right hip, he rpeorts the hip flexor area pain is much improved but reports some of his original posterior hip pain is now what he feels    Currently in Pain?  Yes    Pain Score  4     Pain Location  Hip    Pain Orientation  Right;Posterior    Effect of Pain on Daily Activities  the injection really seemed to help the anterior hip pain                       [facility] Adult PT Treatment/Exercise - 02/21/19 0001      Moist Heat Therapy   Number Minutes Moist Heat  15 Minutes    Moist Heat Location  Hip      Electrical Stimulation   Electrical Stimulation Location  right buttock along the piriformis    Electrical Stimulation Action  IFC    Electrical Stimulation Parameters  supine    Electrical Stimulation Goals  Pain      Manual Therapy   Manual Therapy  Soft tissue mobilization;Passive ROM    Soft tissue mobilization  worked on the glutes and tried to get to the piriformis with STM and use of vibration    Passive ROM  to the right hip stretches in all motions of the  right hip               PT Short Term Goals - 10/05/18 0916      PT SHORT TERM GOAL #1   Title  independent with HEP    Status  Achieved        PT Long Term Goals - 02/21/19 1055      PT LONG TERM GOAL #1   Title  report no pain when getting up in the AM    Status  Partially Met            Plan - 02/21/19 1053    Clinical Impression Statement  Patient had cortisone injection yesterday, we did not do any exercises as the protocol for injections, his pain in the anterior hip is bvetter but he is having the piriformis type pain on the right, I really cautioned him on taking it easy over the next few days as he was talking about returning to walking    PT Next Visit Plan  see how the weekend goes and the injection results    Consulted and Agree with Plan of Care  Patient  Patient will benefit from skilled therapeutic intervention in order to improve the following deficits and impairments:  Abnormal gait, Decreased mobility, Impaired flexibility, Improper body mechanics, Pain, Increased muscle spasms, Increased fascial restricitons, Difficulty walking, Decreased range of motion  Visit Diagnosis: Acute bilateral low back pain with left-sided sciatica  Pain in right hip     Problem List Patient Active Problem List   Diagnosis Date Noted  . Lumbar radiculopathy 09/24/2018  . Foraminal stenosis of lumbar region 09/24/2018  . Spondylosis without myelopathy or radiculopathy, lumbar region 08/29/2016  . Chronic left-sided low back pain without sciatica 08/29/2016    [place]ner Boast., PT 02/21/2019, 10:55 AM  [address] [place], [place], [address] Phone: [phone]   Fax:  [phone]  Name: Ricardo Stewart MRN: [id number] Date of Birth: Apr 09, 1946

## 2019-02-25 ENCOUNTER — Other Ambulatory Visit: Payer: Self-pay | Admitting: Family Medicine

## 2019-02-25 ENCOUNTER — Encounter: Payer: Self-pay | Admitting: Family Medicine

## 2019-02-25 ENCOUNTER — Ambulatory Visit (INDEPENDENT_AMBULATORY_CARE_PROVIDER_SITE_OTHER): Payer: Medicare HMO | Admitting: Family Medicine

## 2019-02-25 ENCOUNTER — Other Ambulatory Visit: Payer: Self-pay

## 2019-02-25 DIAGNOSIS — M25551 Pain in right hip: Secondary | ICD-10-CM | POA: Diagnosis not present

## 2019-02-25 MED ORDER — PREDNISONE 10 MG PO TABS: ORAL_TABLET | ORAL | 0 refills | Status: DC

## 2019-02-25 NOTE — Progress Notes (Signed)
   Office Visit Note   [name]           Date of Birth: 08-27-1946           MRN: [id number] Visit Date: 02/25/2019 Requested by: Kathyrn Lass, [place],  [place] [address] PCP: Kathyrn Lass, MD  Subjective: Chief Complaint  Patient presents with  . right buttock pain    HPI: He is here for right posterior hip pain.  Intra-articular injection got rid of his "hip flexor pain", but he continues to have pain in the buttocks.  He went to physical therapy last week and treatment was directed at the piriformis muscle.  He did have some temporary improvement.  He is not having any sciatica pain.  Previous lumbar MRI scan showed moderate right foraminal stenosis at L4-5.               ROS: No fevers or chills.  All other systems were reviewed and are negative.  Objective: Vital Signs: There were no vitals taken for this visit.  Physical Exam:  General:  Alert and oriented, in no acute distress. Pulm:  Breathing unlabored. Psy:  Normal mood, congruent affect. Skin: No visible rash. Right hip: He is tender to palpation near the piriformis muscle.  No tenderness over the greater trochanter or the SI joint.  No significant pain with internal hip rotation.  Lower extremity strength and reflexes are normal.  Imaging: None today.  Assessment & Plan: 1.  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 -He will continue physical therapy a few more visits.  If he does not make significant improvement, we will consider referral to the afternoon for a right-sided epidural injection.     Procedures: No procedures performed  No notes on file     PMFS History: Patient Active Problem List   Diagnosis Date Noted  . Lumbar radiculopathy 09/24/2018  . Foraminal stenosis of lumbar region 09/24/2018  . Spondylosis without myelopathy or radiculopathy, lumbar region 08/29/2016  . Chronic left-sided  low back pain without sciatica 08/29/2016   History reviewed. No pertinent past medical history.  History reviewed. No pertinent family history.  History reviewed. No pertinent surgical history. Social History   Occupational History  . Not on file  Tobacco Use  . Smoking status: Never Smoker  . Smokeless tobacco: Never Used  Substance and Sexual Activity  . Alcohol use: Not on file  . Drug use: Not on file  . Sexual activity: Not on file

## 2019-02-26 ENCOUNTER — Encounter: Payer: Self-pay | Admitting: Physical Therapy

## 2019-02-26 ENCOUNTER — Ambulatory Visit: Payer: Medicare HMO | Admitting: Physical Therapy

## 2019-02-26 ENCOUNTER — Other Ambulatory Visit: Payer: Self-pay

## 2019-02-26 DIAGNOSIS — M5442 Lumbago with sciatica, left side: Secondary | ICD-10-CM | POA: Diagnosis not present

## 2019-02-26 DIAGNOSIS — M25551 Pain in right hip: Secondary | ICD-10-CM | POA: Diagnosis not present

## 2019-02-26 DIAGNOSIS — M25512 Pain in left shoulder: Secondary | ICD-10-CM | POA: Diagnosis not present

## 2019-02-26 DIAGNOSIS — M25552 Pain in left hip: Secondary | ICD-10-CM | POA: Diagnosis not present

## 2019-02-26 NOTE — Therapy (Signed)
[place] [address] [place], [place], [address] Phone: [phone]   Fax:  [phone]  Physical Therapy Treatment  Patient Details  Name: Jacinto Keil MRN: [id number] Date of Birth: 05-Apr-1946 Referring Provider (PT): Laurence Spates   Encounter Date: 02/26/2019  PT End of Session - 02/26/19 1054    Visit Number  29    Date for PT Re-Evaluation  03/02/19    PT Start Time  1016    PT Stop Time  1105    PT Time Calculation (min)  49 min    Activity Tolerance  Patient tolerated treatment well    Behavior During Therapy  [facility] for tasks assessed/performed       History reviewed. No pertinent past medical history.  History reviewed. No pertinent surgical history.  There were no vitals filed for this visit.  Subjective Assessment - 02/26/19 1048    Subjective  Patient reports that the injection in the anterior hip really helped, he reports that the posterior hip is still hurting, saw MD yesterday and was placed on prednisone again.  Patient reports that he feels great after PT but he is guilty of overdoing it and then having more pain    Currently in Pain?  Yes    Pain Score  5     Pain Location  Hip    Pain Orientation  Right;Posterior    Pain Descriptors / Indicators  Aching    Aggravating Factors   walking    Pain Relieving Factors  the treatment really helps                       [facility] Adult PT Treatment/Exercise - 02/26/19 0001      Lumbar Exercises: Stretches   Passive Hamstring Stretch  4 reps;20 seconds    Piriformis Stretch  4 reps;20 seconds      Moist Heat Therapy   Number Minutes Moist Heat  15 Minutes    Moist Heat Location  Hip      Electrical Stimulation   Electrical Stimulation Location  right buttock along the piriformis    Electrical Stimulation Action  IFC    Electrical Stimulation Parameters  supine    Electrical Stimulation Goals  Pain      Manual Therapy   Manual  Therapy  Soft tissue mobilization;Passive ROM    Soft tissue mobilization  worked on the glutes and tried to get to the piriformis with STM and use of vibration    Passive ROM  to the right hip stretches in all motions of the right hip               PT Short Term Goals - 10/05/18 0916      PT SHORT TERM GOAL #1   Title  independent with HEP    Status  Achieved        PT Long Term Goals - 02/21/19 1055      PT LONG TERM GOAL #1   Title  report no pain when getting up in the AM    Status  Partially Met            Plan - 02/26/19 1054    Clinical Impression Statement  Patient is very tight in the right piriformis, tender, still significant limp on the right.  Again he tends to over do, so I have really cautioned him    PT Next Visit Plan  work on the  piriformis    Consulted and Agree with Plan of Care  Patient       Patient will benefit from skilled therapeutic intervention in order to improve the following deficits and impairments:  Abnormal gait, Decreased mobility, Impaired flexibility, Improper body mechanics, Pain, Increased muscle spasms, Increased fascial restricitons, Difficulty walking, Decreased range of motion  Visit Diagnosis: 1. Acute bilateral low back pain with left-sided sciatica   2. Pain in right hip        Problem List Patient Active Problem List   Diagnosis Date Noted  . Lumbar radiculopathy 09/24/2018  . Foraminal stenosis of lumbar region 09/24/2018  . Spondylosis without myelopathy or radiculopathy, lumbar region 08/29/2016  . Chronic left-sided low back pain without sciatica 08/29/2016    Sumner Boast., PT 02/26/2019, 10:57 AM  [address] [place], [place], [address] Phone: [phone]   Fax:  [phone]  Name: Hearl Heikes MRN: [id number] Date of Birth: January 07, 1946

## 2019-03-01 ENCOUNTER — Other Ambulatory Visit: Payer: Self-pay

## 2019-03-01 ENCOUNTER — Ambulatory Visit: Payer: Medicare HMO | Admitting: Physical Therapy

## 2019-03-01 ENCOUNTER — Encounter: Payer: Self-pay | Admitting: Physical Therapy

## 2019-03-01 DIAGNOSIS — M25551 Pain in right hip: Secondary | ICD-10-CM | POA: Diagnosis not present

## 2019-03-01 DIAGNOSIS — M25552 Pain in left hip: Secondary | ICD-10-CM | POA: Diagnosis not present

## 2019-03-01 DIAGNOSIS — M25512 Pain in left shoulder: Secondary | ICD-10-CM | POA: Diagnosis not present

## 2019-03-01 DIAGNOSIS — M5442 Lumbago with sciatica, left side: Secondary | ICD-10-CM

## 2019-03-01 NOTE — Therapy (Signed)
[place] [address] [place], [place], [address] Phone: [phone]   Fax:  [phone]  Physical Therapy Treatment  Patient Details  Name: Ricardo Stewart MRN: [id number] Date of Birth: [birth year] Referring Provider (PT): Laurence Spates   Encounter Date: 03/01/2019  PT End of Session - 03/01/19 1052    Visit Number  30    Date for PT Re-Evaluation  03/02/19    PT Start Time  1012    PT Stop Time  1101    PT Time Calculation (min)  49 min    Activity Tolerance  Patient tolerated treatment well    Behavior During Therapy  [facility] for tasks assessed/performed       History reviewed. No pertinent past medical history.  History reviewed. No pertinent surgical history.  There were no vitals filed for this visit.  Subjective Assessment - 03/01/19 1049    Subjective  Patient reports that he always feels better when leaving but reports that he has been in a lot of pain and so when he feels good he tends to over do it    Currently in Pain?  Yes    Pain Score  5     Pain Location  Hip    Pain Orientation  Right;Posterior;Lateral    Pain Descriptors / Indicators  Sore;Tightness                       [facility] Adult PT Treatment/Exercise - 03/01/19 0001      Moist Heat Therapy   Number Minutes Moist Heat  15 Minutes    Moist Heat Location  Hip      Electrical Stimulation   Electrical Stimulation Location  right buttock along the piriformis and into the ITB    Electrical Stimulation Action  IFC    Electrical Stimulation Parameters  supine    Electrical Stimulation Goals  Pain      Manual Therapy   Manual Therapy  Soft tissue mobilization;Passive ROM    Soft tissue mobilization  worked on the glutes and tried to get to the piriformis with STM and use of vibration, added ITB today    Passive ROM  to the bilateral hip stretches in all motions of the right hip               PT Short Term Goals -  10/05/18 0916      PT SHORT TERM GOAL #1   Title  independent with HEP    Status  Achieved        PT Long Term Goals - 03/01/19 1054      PT LONG TERM GOAL #1   Title  report no pain when getting up in the AM    Status  Partially Met      PT LONG TERM GOAL #3   Title  increase SLR to 60 degrees    Status  Partially Met            Plan - 03/01/19 1052    Clinical Impression Statement  Very tight in the right ITB, very tender here as well.  His flexibility is still an issue and he reports difficulty with stretching on his own    PT Next Visit Plan  work on the piriformis and ITB    Consulted and Agree with Plan of Care  Patient       Patient will benefit from skilled therapeutic intervention in order to  improve the following deficits and impairments:  Abnormal gait, Decreased mobility, Impaired flexibility, Improper body mechanics, Pain, Increased muscle spasms, Increased fascial restricitons, Difficulty walking, Decreased range of motion  Visit Diagnosis: 1. Acute bilateral low back pain with left-sided sciatica   2. Pain in right hip        Problem List Patient Active Problem List   Diagnosis Date Noted  . Lumbar radiculopathy 09/24/2018  . Foraminal stenosis of lumbar region 09/24/2018  . Spondylosis without myelopathy or radiculopathy, lumbar region 08/29/2016  . Chronic left-sided low back pain without sciatica 08/29/2016    Sumner Boast., PT 03/01/2019, 10:55 AM  [address] [place], [place], [address] Phone: [phone]   Fax:  [phone]  Name: Ricardo Stewart MRN: [id number] Date of Birth: 06-Dec-1945

## 2019-03-04 ENCOUNTER — Telehealth: Payer: Self-pay | Admitting: Family Medicine

## 2019-03-04 DIAGNOSIS — M25551 Pain in right hip: Secondary | ICD-10-CM

## 2019-03-04 NOTE — Telephone Encounter (Signed)
Patient would like to try the [facility]. He will continue with PT in the meantime.

## 2019-03-04 NOTE — Telephone Encounter (Signed)
Ok, could refer him to [facility] for a right-sided ESI if he'd prefer.

## 2019-03-04 NOTE — Telephone Encounter (Signed)
Patient called back left voicemail message to return call. The number to contact patient is [phone]

## 2019-03-04 NOTE — Telephone Encounter (Signed)
Tried calling patient at number he left in the original message - reached mailbox that has not been set up yet.

## 2019-03-04 NOTE — Telephone Encounter (Signed)
I spoke with the patient. See other message on this from today. 

## 2019-03-04 NOTE — Telephone Encounter (Signed)
Patient called left voicemail message stating the (PT) is not going to work and Dr Junius Roads have to go to [facility]  The number to contact patient is [phone]

## 2019-03-04 NOTE — Telephone Encounter (Signed)
Ordered

## 2019-03-04 NOTE — Telephone Encounter (Signed)
Please advise 

## 2019-03-05 ENCOUNTER — Other Ambulatory Visit: Payer: Self-pay

## 2019-03-05 ENCOUNTER — Ambulatory Visit: Payer: Medicare HMO | Admitting: Physical Therapy

## 2019-03-05 ENCOUNTER — Encounter: Payer: Self-pay | Admitting: Physical Therapy

## 2019-03-05 DIAGNOSIS — M25552 Pain in left hip: Secondary | ICD-10-CM

## 2019-03-05 DIAGNOSIS — M25551 Pain in right hip: Secondary | ICD-10-CM | POA: Diagnosis not present

## 2019-03-05 DIAGNOSIS — M25512 Pain in left shoulder: Secondary | ICD-10-CM | POA: Diagnosis not present

## 2019-03-05 DIAGNOSIS — M5442 Lumbago with sciatica, left side: Secondary | ICD-10-CM

## 2019-03-05 NOTE — Therapy (Signed)
[place], [place], [address] Phone: [phone]   Fax:  [phone]  Physical Therapy Treatment  Patient Details  Name: Olie Dibert MRN: [id number] Date of Birth: 06/20/1946 Referring Provider (PT): Laurence Spates   Encounter Date: 03/05/2019  PT End of Session - 03/05/19 0911    Visit Number  31    Date for PT Re-Evaluation  04/04/19    PT Start Time  0842    PT Stop Time  0930    PT Time Calculation (min)  48 min    Activity Tolerance  Patient tolerated treatment well    Behavior During Therapy  [facility] for tasks assessed/performed       History reviewed. No pertinent past medical history.  History reviewed. No pertinent surgical history.  There were no vitals filed for this visit.  Subjective Assessment - 03/05/19 0843    Subjective  Patient reports that he may have an epidural steroid injection in the near future.  Reports that the hip is hurting more    Currently in Pain?  Yes    Pain Score  6     Pain Location  Hip    Pain Orientation  Right;Posterior;Lateral                       [facility] Adult PT Treatment/Exercise - 03/05/19 0001      Modalities   Modalities  Electrical Stimulation;Moist Heat;Traction      Moist Heat Therapy   Number Minutes Moist Heat  15 Minutes    Moist Heat Location  Hip      Electrical Stimulation   Electrical Stimulation Location  right buttock along the piriformis and into the ITB    Electrical Stimulation Action  IFC    Electrical Stimulation Parameters  supine    Electrical Stimulation Goals  Pain      Traction   Type of Traction  Lumbar    Min (lbs)  65    Max (lbs)  80    Hold Time  60    Rest Time  20    Time  15      Manual Therapy   Manual Therapy  Soft tissue mobilization;Passive ROM    Soft tissue mobilization  worked on the glutes and tried to get to the piriformis with STM and use of vibration, added ITB today, gentle  today due to the fact that he reported last week he was very sore for a few days    Passive ROM  to the bilateral hip stretches in all motions of the right hip               PT Short Term Goals - 10/05/18 0916      PT SHORT TERM GOAL #1   Title  independent with HEP    Status  Achieved        PT Long Term Goals - 03/05/19 0913      PT LONG TERM GOAL #1   Title  report no pain when getting up in the AM    Status  Partially Met      PT LONG TERM GOAL #2   Title  walk without issue his 3-4 miles    Status  Partially Met      PT LONG TERM GOAL #3   Title  increase SLR to 60 degrees    Status  Achieved  PT LONG TERM GOAL #4   Title  understand posture and body mechanics    Status  Achieved            Plan - 03/05/19 0911    Clinical Impression Statement  Patient reported that he may have an epidural injection in the future, I decided since we had good results with his pain on the left side using traction that we should try it for this since he has not had much that really decreases this right hip pain.    PT Next Visit Plan  see if traction helped    Consulted and Agree with Plan of Care  Patient       Patient will benefit from skilled therapeutic intervention in order to improve the following deficits and impairments:  Abnormal gait, Decreased mobility, Impaired flexibility, Improper body mechanics, Pain, Increased muscle spasms, Increased fascial restricitons, Difficulty walking, Decreased range of motion  Visit Diagnosis: 1. Acute bilateral low back pain with left-sided sciatica   2. Pain in right hip   3. Pain in left hip        Problem List Patient Active Problem List   Diagnosis Date Noted  . Lumbar radiculopathy 09/24/2018  . Foraminal stenosis of lumbar region 09/24/2018  . Spondylosis without myelopathy or radiculopathy, lumbar region 08/29/2016  . Chronic left-sided low back pain without sciatica 08/29/2016    Sumner Boast.,  PT 03/05/2019, 9:14 AM  [address] [place], [place], [address] Phone: [phone]   Fax:  [phone]  Name: Sahid Borba MRN: [id number] Date of Birth: [birth year]

## 2019-03-07 ENCOUNTER — Other Ambulatory Visit: Payer: Self-pay

## 2019-03-07 ENCOUNTER — Ambulatory Visit: Payer: Medicare HMO | Admitting: Physical Therapy

## 2019-03-07 ENCOUNTER — Encounter: Payer: Self-pay | Admitting: Physical Therapy

## 2019-03-07 DIAGNOSIS — M25552 Pain in left hip: Secondary | ICD-10-CM

## 2019-03-07 DIAGNOSIS — M25551 Pain in right hip: Secondary | ICD-10-CM

## 2019-03-07 DIAGNOSIS — M5442 Lumbago with sciatica, left side: Secondary | ICD-10-CM

## 2019-03-07 DIAGNOSIS — M25512 Pain in left shoulder: Secondary | ICD-10-CM | POA: Diagnosis not present

## 2019-03-07 NOTE — Therapy (Signed)
[place], [place], [address] Phone: [phone]   Fax:  [phone]  Physical Therapy Treatment  Patient Details  Name: Vaun Hyndman MRN: [id number] Date of Birth: Oct 08, 1945 Referring Provider (PT): Laurence Spates   Encounter Date: 03/07/2019  PT End of Session - 03/07/19 1010    Visit Number  32    Date for PT Re-Evaluation  04/04/19    PT Start Time  0843    PT Stop Time  0945    PT Time Calculation (min)  62 min    Activity Tolerance  Patient tolerated treatment well    Behavior During Therapy  [facility] for tasks assessed/performed       History reviewed. No pertinent past medical history.  History reviewed. No pertinent surgical history.  There were no vitals filed for this visit.  Subjective Assessment - 03/07/19 1009    Subjective  Patient thought the traction helped reports that he had an increase of pain this AM trying to put shoes on                       [facility] Adult PT Treatment/Exercise - 03/07/19 0001      Modalities   Modalities  Electrical Stimulation;Moist Heat;Traction      Moist Heat Therapy   Number Minutes Moist Heat  15 Minutes    Moist Heat Location  Hip      Electrical Stimulation   Electrical Stimulation Location  right buttock along the piriformis and into the ITB    Electrical Stimulation Action  IFC   IFC   Electrical Stimulation Parameters  supine    Electrical Stimulation Goals  Pain      Traction   Type of Traction  Lumbar    Min (lbs)  65    Max (lbs)  80    Hold Time  60    Rest Time  20    Time  15      Manual Therapy   Manual Therapy  Soft tissue mobilization;Passive ROM    Soft tissue mobilization  worked on the glutes and tried to get to the piriformis with STM and use of vibration, added ITB today, gentle today due to the fact that he reported last week he was very sore for a few days    Passive ROM  to the bilateral hip  stretches in all motions of the right hip               PT Short Term Goals - 10/05/18 0916      PT SHORT TERM GOAL #1   Title  independent with HEP    Status  Achieved        PT Long Term Goals - 03/07/19 1014      PT LONG TERM GOAL #1   Title  report no pain when getting up in the AM    Status  Partially Met            Plan - 03/07/19 1010    Clinical Impression Statement  We tried the traction last visit, he felt like it helped some, he is still limping pretty significantly on the right, I have backed off of the deep STM and tried to go lighter and focus more on the flexibility    PT Next Visit Plan  continue to work on the back and the hip    Consulted and Agree with Plan  of Care  Patient       Patient will benefit from skilled therapeutic intervention in order to improve the following deficits and impairments:  Abnormal gait, Decreased mobility, Impaired flexibility, Improper body mechanics, Pain, Increased muscle spasms, Increased fascial restricitons, Difficulty walking, Decreased range of motion  Visit Diagnosis: 1. Acute bilateral low back pain with left-sided sciatica   2. Pain in right hip   3. Pain in left hip        Problem List Patient Active Problem List   Diagnosis Date Noted  . Lumbar radiculopathy 09/24/2018  . Foraminal stenosis of lumbar region 09/24/2018  . Spondylosis without myelopathy or radiculopathy, lumbar region 08/29/2016  . Chronic left-sided low back pain without sciatica 08/29/2016    Sumner Boast., PT 03/07/2019, 10:14 AM  [place], [place], [address] Phone: [phone]   Fax:  [phone]  Name: Rakan Soffer MRN: [id number] Date of Birth: 08/09/1946

## 2019-03-12 ENCOUNTER — Ambulatory Visit: Payer: Medicare HMO | Admitting: Physical Therapy

## 2019-03-12 ENCOUNTER — Encounter: Payer: Self-pay | Admitting: Physical Therapy

## 2019-03-12 ENCOUNTER — Other Ambulatory Visit: Payer: Self-pay

## 2019-03-12 DIAGNOSIS — M5442 Lumbago with sciatica, left side: Secondary | ICD-10-CM | POA: Diagnosis not present

## 2019-03-12 DIAGNOSIS — M25512 Pain in left shoulder: Secondary | ICD-10-CM | POA: Diagnosis not present

## 2019-03-12 DIAGNOSIS — M25551 Pain in right hip: Secondary | ICD-10-CM

## 2019-03-12 DIAGNOSIS — M25552 Pain in left hip: Secondary | ICD-10-CM | POA: Diagnosis not present

## 2019-03-12 NOTE — Therapy (Signed)
[place], [place], [address] Phone: [phone]   Fax:  [phone]  Physical Therapy Treatment  Patient Details  Name: Ricardo Stewart MRN: [id number] Date of Birth: 1945/09/30 Referring Provider (PT): Laurence Spates   Encounter Date: 03/12/2019  PT End of Session - 03/12/19 1157    Visit Number  33    Date for PT Re-Evaluation  04/04/19    PT Start Time  0930    PT Stop Time  1020    PT Time Calculation (min)  50 min    Activity Tolerance  Patient tolerated treatment well    Behavior During Therapy  [facility] for tasks assessed/performed       History reviewed. No pertinent past medical history.  History reviewed. No pertinent surgical history.  There were no vitals filed for this visit.  Subjective Assessment - 03/12/19 0939    Subjective  Patient reports that he will be having an injection soon, still difficulty walking    Currently in Pain?  Yes    Pain Score  5     Pain Location  Hip    Pain Orientation  Right;Posterior    Aggravating Factors   walking                       [facility] Adult PT Treatment/Exercise - 03/12/19 0001      Modalities   Modalities  Electrical Stimulation;Moist Heat;Traction      Moist Heat Therapy   Number Minutes Moist Heat  14 Minutes    Moist Heat Location  Hip      Electrical Stimulation   Electrical Stimulation Location  right buttock along the piriformis and into the ITB    Electrical Stimulation Action  IFC    Electrical Stimulation Parameters  supine    Electrical Stimulation Goals  Pain      Traction   Type of Traction  Lumbar    Min (lbs)  65    Max (lbs)  80    Hold Time  60    Rest Time  20    Time  15      Manual Therapy   Manual Therapy  Soft tissue mobilization;Passive ROM    Soft tissue mobilization  worked on the glutes and tried to get to the piriformis with STM and use of vibration, added ITB today, gentle today due to  the fact that he reported last week he was very sore for a few days    Passive ROM  to the bilateral hip stretches in all motions of the right hip               PT Short Term Goals - 10/05/18 0916      PT SHORT TERM GOAL #1   Title  independent with HEP    Status  Achieved        PT Long Term Goals - 03/07/19 1014      PT LONG TERM GOAL #1   Title  report no pain when getting up in the AM    Status  Partially Met            Plan - 03/12/19 1158    Clinical Impression Statement  Patient reports that he thinks that the traction may be helping, he is still limping significantly with weight bearing on the right.  He is very tight in the ITB today with some increased c/o pain  with this stretch    PT Next Visit Plan  continue to work on the back and the hip    Consulted and Agree with Plan of Care  Patient       Patient will benefit from skilled therapeutic intervention in order to improve the following deficits and impairments:  Abnormal gait, Decreased mobility, Impaired flexibility, Improper body mechanics, Pain, Increased muscle spasms, Increased fascial restricitons, Difficulty walking, Decreased range of motion  Visit Diagnosis: 1. Acute bilateral low back pain with left-sided sciatica   2. Pain in right hip        Problem List Patient Active Problem List   Diagnosis Date Noted  . Lumbar radiculopathy 09/24/2018  . Foraminal stenosis of lumbar region 09/24/2018  . Spondylosis without myelopathy or radiculopathy, lumbar region 08/29/2016  . Chronic left-sided low back pain without sciatica 08/29/2016    Sumner Boast., PT 03/12/2019, 12:00 PM  [place], [place], [address] Phone: [phone]   Fax:  [phone]  Name: Ricardo Stewart MRN: [id number] Date of Birth: 16-Jun-1946

## 2019-03-13 ENCOUNTER — Encounter: Payer: Self-pay | Admitting: Physical Therapy

## 2019-03-13 ENCOUNTER — Other Ambulatory Visit: Payer: Self-pay

## 2019-03-13 ENCOUNTER — Ambulatory Visit: Payer: Medicare HMO | Attending: Family Medicine | Admitting: Physical Therapy

## 2019-03-13 DIAGNOSIS — M25552 Pain in left hip: Secondary | ICD-10-CM | POA: Diagnosis not present

## 2019-03-13 DIAGNOSIS — M5442 Lumbago with sciatica, left side: Secondary | ICD-10-CM

## 2019-03-13 DIAGNOSIS — M25512 Pain in left shoulder: Secondary | ICD-10-CM | POA: Diagnosis not present

## 2019-03-13 DIAGNOSIS — M25551 Pain in right hip: Secondary | ICD-10-CM | POA: Diagnosis not present

## 2019-03-13 NOTE — Therapy (Signed)
[place], [place], [address] Phone: [phone]   Fax:  [phone]  Physical Therapy Treatment  Patient Details  Name: Ricardo Stewart MRN: [id number] Date of Birth: [birth year] Referring Provider (PT): Laurence Spates   Encounter Date: 03/13/2019  PT End of Session - 03/13/19 1155    Visit Number  34    Date for PT Re-Evaluation  04/04/19    PT Start Time  1057    PT Stop Time  1148    PT Time Calculation (min)  51 min    Activity Tolerance  Patient tolerated treatment well    Behavior During Therapy  [facility] for tasks assessed/performed       History reviewed. No pertinent past medical history.  History reviewed. No pertinent surgical history.  There were no vitals filed for this visit.  Subjective Assessment - 03/13/19 1135    Subjective  Patient reports that he felt good after yesterdays treatment but then did a lot of walking and had increased pain and difficulty walking    Currently in Pain?  Yes    Pain Score  5     Pain Location  Hip    Pain Orientation  Right    Aggravating Factors   walking                       [facility] Adult PT Treatment/Exercise - 03/13/19 0001      Modalities   Modalities  Electrical Stimulation;Moist Heat;Traction      Moist Heat Therapy   Number Minutes Moist Heat  15 Minutes    Moist Heat Location  Hip      Electrical Stimulation   Electrical Stimulation Location  right buttock along the piriformis and into the ITB    Electrical Stimulation Action  IFC    Electrical Stimulation Parameters  supine    Electrical Stimulation Goals  Pain      Manual Therapy   Manual Therapy  Soft tissue mobilization;Passive ROM    Soft tissue mobilization  glutes, ITB and lumbar area on the right in left side lying    Myofascial Release  myofascial release of the right psoas and some into the groin, very tender in spots here, a little more gentle    Passive ROM   to the bilateral hip stretches in all motions of the right hip               PT Short Term Goals - 10/05/18 0916      PT SHORT TERM GOAL #1   Title  independent with HEP    Status  Achieved        PT Long Term Goals - 03/13/19 1205      PT LONG TERM GOAL #1   Title  report no pain when getting up in the AM    Status  Partially Met      PT LONG TERM GOAL #3   Title  increase SLR to 60 degrees    Status  Achieved      PT LONG TERM GOAL #4   Title  understand posture and body mechanics    Status  Achieved            Plan - 03/13/19 1156    Clinical Impression Statement  Patient is getting relief from treatment but the carry over has been lacking, however he has been active and this seems to increase his  pain.He will be getting and injection tomorrow, I asked him to verify wiht the MD on what he can and cannot do as he was talking about going for a walk after.    PT Next Visit Plan  see how he is doing after the injection    Consulted and Agree with Plan of Care  Patient       Patient will benefit from skilled therapeutic intervention in order to improve the following deficits and impairments:  Abnormal gait, Decreased mobility, Impaired flexibility, Improper body mechanics, Pain, Increased muscle spasms, Increased fascial restricitons, Difficulty walking, Decreased range of motion  Visit Diagnosis: 1. Acute bilateral low back pain with left-sided sciatica   2. Pain in right hip   3. Pain in left hip   4. Acute pain of left shoulder        Problem List Patient Active Problem List   Diagnosis Date Noted  . Lumbar radiculopathy 09/24/2018  . Foraminal stenosis of lumbar region 09/24/2018  . Spondylosis without myelopathy or radiculopathy, lumbar region 08/29/2016  . Chronic left-sided low back pain without sciatica 08/29/2016    Sumner Boast., PT 03/13/2019, 12:06 PM  [place]  [address] [place], [place], [address] Phone: [phone]   Fax:  [phone]  Name: Ricardo Stewart MRN: [id number] Date of Birth: 1945/11/14

## 2019-03-14 ENCOUNTER — Encounter: Payer: Self-pay | Admitting: Physical Medicine and Rehabilitation

## 2019-03-14 ENCOUNTER — Ambulatory Visit: Payer: Self-pay

## 2019-03-14 ENCOUNTER — Ambulatory Visit (INDEPENDENT_AMBULATORY_CARE_PROVIDER_SITE_OTHER): Payer: Medicare HMO | Admitting: Physical Medicine and Rehabilitation

## 2019-03-14 ENCOUNTER — Ambulatory Visit: Payer: Medicare HMO | Admitting: Physical Therapy

## 2019-03-14 VITALS — BP 130/75 | HR 75

## 2019-03-14 DIAGNOSIS — M5416 Radiculopathy, lumbar region: Secondary | ICD-10-CM | POA: Diagnosis not present

## 2019-03-14 MED ORDER — BETAMETHASONE SOD PHOS & ACET 6 (3-3) MG/ML IJ SUSP
12.0000 mg | Freq: Once | INTRAMUSCULAR | Status: AC
  Administered 2019-03-14: 12 mg

## 2019-03-14 NOTE — Progress Notes (Signed)
Ricardo Stewart - 73 y.o. male MRN [id number]  Date of birth: [birth year]  Office Visit Note: Visit Date: 03/14/2019 PCP: Sigmund HazelMiller, Lisa, MD Referred by: Sigmund HazelMiller, Lisa, MD  Subjective: Chief Complaint  Patient presents with   Right Hip - Pain   Right Thigh - Pain   HPI:  Ricardo Stewart is a 73 y.o. male who comes in today For planned right L4 transforaminal epidural steroid injection.  This is requested by Dr. Lavada MesiMichael Hilts who the patient used to see in the past and now starting to see him once again.  Patient been doing physical therapy and is also had ultrasound-guided intra-articular hip injection with good relief of some of his hip pain but not the posterior pain.  He does ambulate with a limp.  He is getting some referral pattern that he says is to the iliotibial band and he feels like the pain is in the piriformis.  He does have pretty significant lumbar spondylosis and we have treated him for his back in the past.  He had a history of facet joint cyst which would resolve but now has foraminal narrowing at L4-5 on the right and that could be a source of his radicular type pain.  Symptoms may be more of an L5 distribution however.  We will complete L4 transforaminal injection today diagnostically.  If it still felt to be spine related would consider L5 injection.  ROS Otherwise per HPI.  Assessment & Plan: Visit Diagnoses:  1. Radiculopathy, lumbar region     Plan: No additional findings.   Meds & Orders:  Meds ordered this encounter  Medications   betamethasone acetate-betamethasone sodium phosphate (CELESTONE) injection 12 mg    Orders Placed This Encounter  Procedures   XR C-ARM NO REPORT   Epidural Steroid injection    Follow-up: No follow-ups on file.   Procedures: No procedures performed  Lumbosacral Transforaminal Epidural Steroid Injection - Sub-Pedicular Approach with Fluoroscopic Guidance  [name]      Date of Birth: [birth year] MRN: [id number] PCP:  Sigmund HazelMiller, Lisa, MD      Visit Date: 03/14/2019   Universal Protocol:    Date/Time: 03/14/2019  Consent Given By: the patient  Position: PRONE  Additional Comments: Vital signs were monitored before and after the procedure. Patient was prepped and draped in the usual sterile fashion. The correct patient, procedure, and site was verified.   Injection Procedure Details:  Procedure Site One Meds Administered:  Meds ordered this encounter  Medications   betamethasone acetate-betamethasone sodium phosphate (CELESTONE) injection 12 mg    Laterality: Right  Location/Site:  L4-L5  Needle size: 22 G  Needle type: Spinal  Needle Placement: Transforaminal  Findings:    -Comments: Excellent flow of contrast along the nerve and into the epidural space.  Procedure Details: After squaring off the end-plates to get a true AP view, the C-arm was positioned so that an oblique view of the foramen as noted above was visualized. The target area is just inferior to the "nose of the scotty dog" or sub pedicular. The soft tissues overlying this structure were infiltrated with 2-3 ml. of 1% Lidocaine without Epinephrine.  The spinal needle was inserted toward the target using a "trajectory" view along the fluoroscope beam.  Under AP and lateral visualization, the needle was advanced so it did not puncture dura and was located close the 6 O'Clock position of the pedical in AP tracterory. Biplanar projections were used to confirm position. Aspiration was confirmed to be  negative for CSF and/or blood. A 1-2 ml. volume of Isovue-250 was injected and flow of contrast was noted at each level. Radiographs were obtained for documentation purposes.   After attaining the desired flow of contrast documented above, a 0.5 to 1.0 ml test dose of 0.25% Marcaine was injected into each respective transforaminal space.  The patient was observed for 90 seconds post injection.  After no sensory deficits were reported,  and normal lower extremity motor function was noted,   the above injectate was administered so that equal amounts of the injectate were placed at each foramen (level) into the transforaminal epidural space.   Additional Comments:  The patient tolerated the procedure well Dressing: 2 x 2 sterile gauze and Band-Aid    Post-procedure details: Patient was observed during the procedure. Post-procedure instructions were reviewed.  Patient left the clinic in stable condition.     Clinical History: MRI LUMBAR SPINE WITHOUT CONTRAST  TECHNIQUE: Multiplanar, multisequence MR imaging of the lumbar spine was performed. No intravenous contrast was administered.  COMPARISON: 01/20/2011  FINDINGS: Segmentation: Standard.  Alignment: Physiologic.  Vertebrae: No fracture, evidence of discitis, or bone lesion.  Conus medullaris and cauda equina: Conus extends to the L1 level. Conus and cauda equina appear normal.  Paraspinal and other soft tissues: No acute paraspinal abnormality.  Disc levels:  Disc spaces: Severe degenerative disc disease with disc height loss at L4-5. Mild degenerative disc disease with mild disc height loss at T11-12 and T12-L1. Disc desiccation at L5-S1 and L3-4.  T12-L1: Small right paracentral disc protrusion. No evidence of neural foraminal stenosis. No central canal stenosis.  L1-L2: No significant disc bulge. No evidence of neural foraminal stenosis. No central canal stenosis.  L2-L3: No significant disc bulge. No evidence of neural foraminal stenosis. No central canal stenosis.  L3-L4: Broad-based disc bulge. Mild bilateral facet arthropathy. Mild spinal stenosis. No evidence of neural foraminal stenosis.  L4-L5: Broad-based disc bulge with a right lateral disc osteophyte complex. Moderate bilateral facet arthropathy. Moderate spinal stenosis and bilateral lateral recess stenosis. No left foraminal stenosis. Moderate right foraminal  stenosis.  L5-S1: Broad-based disc bulge eccentric towards the left. Severe bilateral facet arthropathy with ligamentum flavum infolding. Left lateral recess stenosis. Mild spinal stenosis. Severe left foraminal stenosis. Mild right foraminal stenosis. 3 mm left facet posterior extra-spinal synovial cyst.  IMPRESSION: 1. At L4-5 there is a broad-based disc bulge with a right lateral disc osteophyte complex. Moderate bilateral facet arthropathy. Moderate spinal stenosis and bilateral lateral recess stenosis. No left foraminal stenosis. Moderate right foraminal stenosis. 2. At L5-S1 there is a broad-based disc bulge eccentric towards the left. Severe bilateral facet arthropathy with ligamentum flavum infolding. Left lateral recess stenosis. Mild spinal stenosis. Severe left foraminal stenosis. Mild right foraminal stenosis. 3 mm left facet posterior extra-spinal synovial cyst. 3. Interval resolution of large intraspinal synovial cyst at L5-S1 and a smaller intraspinal synovial cyst at L4-5.   Electronically Signed By: Kathreen Devoid On: 02/03/2018 18:33   Lumbar Spine 01/21/2011  MRI LUMBAR SPINE WITHOUT CONTRAST l  IMPRESSION:   1. Spondylosis and degenerative disc disease with dominant findings at the L4-5 and L5-S1 levels. 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 2. 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 If procedural intervention is to be performed, careful correlation with this numbering strategy is recommended.     Objective:  VS:  HT:  WT:    BMI:      BP:130/75   HR:75bpm   TEMP: ( )   RESP:  Physical Exam  Ortho Exam Imaging: Xr C-arm No Report  Result Date: 03/14/2019 Please see Notes tab for imaging impression.

## 2019-03-14 NOTE — Progress Notes (Signed)
.  Numeric Pain Rating Scale and Functional Assessment Average Pain 6   In the last MONTH (on 0-10 scale) has pain interfered with the following?  1. General activity like being  able to carry out your everyday physical activities such as walking, climbing stairs, carrying groceries, or moving a chair?  Rating(5)   +Driver, -BT, -Dye Allergies.  

## 2019-03-14 NOTE — Procedures (Signed)
Lumbosacral Transforaminal Epidural Steroid Injection - Sub-Pedicular Approach with Fluoroscopic Guidance  [name]      Date of Birth: 1945-09-24 MRN: [id number] PCP: Kathyrn Lass, MD      Visit Date: 03/14/2019   Universal Protocol:    Date/Time: 03/14/2019  Consent Given By: the patient  Position: PRONE  Additional Comments: Vital signs were monitored before and after the procedure. Patient was prepped and draped in the usual sterile fashion. The correct patient, procedure, and site was verified.   Injection Procedure Details:  Procedure Site One Meds Administered:  Meds ordered this encounter  Medications  . betamethasone acetate-betamethasone sodium phosphate (CELESTONE) injection 12 mg    Laterality: Right  Location/Site:  L4-L5  Needle size: 22 G  Needle type: Spinal  Needle Placement: Transforaminal  Findings:    -Comments: Excellent flow of contrast along the nerve and into the epidural space.  Procedure Details: After squaring off the end-plates to get a true AP view, the C-arm was positioned so that an oblique view of the foramen as noted above was visualized. The target area is just inferior to the "nose of the scotty dog" or sub pedicular. The soft tissues overlying this structure were infiltrated with 2-3 ml. of 1% Lidocaine without Epinephrine.  The spinal needle was inserted toward the target using a "trajectory" view along the fluoroscope beam.  Under AP and lateral visualization, the needle was advanced so it did not puncture dura and was located close the 6 O'Clock position of the pedical in AP tracterory. Biplanar projections were used to confirm position. Aspiration was confirmed to be negative for CSF and/or blood. A 1-2 ml. volume of Isovue-250 was injected and flow of contrast was noted at each level. Radiographs were obtained for documentation purposes.   After attaining the desired flow of contrast documented above, a 0.5 to 1.0 ml  test dose of 0.25% Marcaine was injected into each respective transforaminal space.  The patient was observed for 90 seconds post injection.  After no sensory deficits were reported, and normal lower extremity motor function was noted,   the above injectate was administered so that equal amounts of the injectate were placed at each foramen (level) into the transforaminal epidural space.   Additional Comments:  The patient tolerated the procedure well Dressing: 2 x 2 sterile gauze and Band-Aid    Post-procedure details: Patient was observed during the procedure. Post-procedure instructions were reviewed.  Patient left the clinic in stable condition.

## 2019-03-18 ENCOUNTER — Telehealth: Payer: Self-pay | Admitting: Physical Medicine and Rehabilitation

## 2019-03-18 NOTE — Telephone Encounter (Signed)
Patient calling to let you know injection helped buttock pain some, but leg pain still there. Please call to advise.

## 2019-03-18 NOTE — Telephone Encounter (Signed)
Called patient to advise that it can take up to 2 weeks for the injection to take full effect. No answer and voicemail has not been set up.

## 2019-03-19 ENCOUNTER — Ambulatory Visit: Payer: Medicare HMO | Admitting: Physical Therapy

## 2019-03-19 ENCOUNTER — Encounter: Payer: Self-pay | Admitting: Physical Therapy

## 2019-03-19 ENCOUNTER — Other Ambulatory Visit: Payer: Self-pay

## 2019-03-19 DIAGNOSIS — M25512 Pain in left shoulder: Secondary | ICD-10-CM | POA: Diagnosis not present

## 2019-03-19 DIAGNOSIS — M25552 Pain in left hip: Secondary | ICD-10-CM

## 2019-03-19 DIAGNOSIS — M25551 Pain in right hip: Secondary | ICD-10-CM

## 2019-03-19 DIAGNOSIS — M5442 Lumbago with sciatica, left side: Secondary | ICD-10-CM

## 2019-03-19 NOTE — Therapy (Signed)
[address] [place], [place], [address] Phone: [phone]   Fax:  [phone]  Physical Therapy Treatment  Patient Details  Name: Ricardo Stewart MRN: [id number] Date of Birth: [birth year] Referring Provider (PT): Laurence Spates   Encounter Date: 03/19/2019  PT End of Session - 03/19/19 1005    Visit Number  35    Date for PT Re-Evaluation  04/04/19    PT Start Time  0926    PT Stop Time  1014    PT Time Calculation (min)  48 min    Activity Tolerance  Patient tolerated treatment well    Behavior During Therapy  [facility] for tasks assessed/performed       History reviewed. No pertinent past medical history.  History reviewed. No pertinent surgical history.  There were no vitals filed for this visit.  Subjective Assessment - 03/19/19 1002    Subjective  Patient had an injection last week.  He reports that he has had no real changes in his pain level.  He is reporting pain is mostly in the tight posterior and lateral hip area today with some pain int the ITB    Currently in Pain?  Yes    Pain Score  6     Pain Location  Hip    Pain Orientation  Right;Posterior;Lateral                       [facility] Adult PT Treatment/Exercise - 03/19/19 0001      Modalities   Modalities  Electrical Stimulation;Moist Heat;Traction      Moist Heat Therapy   Number Minutes Moist Heat  15 Minutes    Moist Heat Location  Hip      Electrical Stimulation   Electrical Stimulation Location  right buttock along the piriformis and into the ITB    Electrical Stimulation Action  IFC    Electrical Stimulation Parameters  supine    Electrical Stimulation Goals  Pain      Manual Therapy   Manual Therapy  Soft tissue mobilization;Passive ROM    Soft tissue mobilization  more focus on the right glute and ITB today    Passive ROM  to the bilateral hip stretches in all motions of the right hip               PT  Short Term Goals - 10/05/18 0916      PT SHORT TERM GOAL #1   Title  independent with HEP    Status  Achieved        PT Long Term Goals - 03/13/19 1205      PT LONG TERM GOAL #1   Title  report no pain when getting up in the AM    Status  Partially Met      PT LONG TERM GOAL #3   Title  increase SLR to 60 degrees    Status  Achieved      PT LONG TERM GOAL #4   Title  understand posture and body mechanics    Status  Achieved            Plan - 03/19/19 1006    Clinical Impression Statement  Patient reports that he had the injection last Thursday, reports that he has no appreciable relief.  He is still with a significant limp when weight bearing on the right.  He seems to be a little more tight and more sore  today with the stretching, I tried to be moderate with the STM as when I have been aggressive in the past sometimes tha thas made things worse.    PT Next Visit Plan  patient is calling the physician, he does report tha the is doing more and more things due to him trying to sell a house and it having a contract on it    Consulted and Agree with Plan of Care  Patient       Patient will benefit from skilled therapeutic intervention in order to improve the following deficits and impairments:  Abnormal gait, Decreased mobility, Impaired flexibility, Improper body mechanics, Pain, Increased muscle spasms, Increased fascial restricitons, Difficulty walking, Decreased range of motion  Visit Diagnosis: 1. Acute bilateral low back pain with left-sided sciatica   2. Pain in right hip   3. Pain in left hip        Problem List Patient Active Problem List   Diagnosis Date Noted  . Lumbar radiculopathy 09/24/2018  . Foraminal stenosis of lumbar region 09/24/2018  . Spondylosis without myelopathy or radiculopathy, lumbar region 08/29/2016  . Chronic left-sided low back pain without sciatica 08/29/2016    Sumner Boast., PT 03/19/2019, 10:09 AM  [address] [place], [place], [address] Phone: [phone]   Fax:  [phone]  Name: Ricardo Stewart MRN: [id number] Date of Birth: Aug 01, 1946

## 2019-03-21 ENCOUNTER — Ambulatory Visit: Payer: Medicare HMO | Admitting: Physical Therapy

## 2019-03-21 ENCOUNTER — Encounter: Payer: Self-pay | Admitting: Physical Therapy

## 2019-03-21 ENCOUNTER — Other Ambulatory Visit: Payer: Self-pay

## 2019-03-21 DIAGNOSIS — M25551 Pain in right hip: Secondary | ICD-10-CM | POA: Diagnosis not present

## 2019-03-21 DIAGNOSIS — M25512 Pain in left shoulder: Secondary | ICD-10-CM | POA: Diagnosis not present

## 2019-03-21 DIAGNOSIS — M5442 Lumbago with sciatica, left side: Secondary | ICD-10-CM

## 2019-03-21 DIAGNOSIS — M25552 Pain in left hip: Secondary | ICD-10-CM | POA: Diagnosis not present

## 2019-03-21 NOTE — Therapy (Signed)
[address] [place], [place], [address] Phone: [phone]   Fax:  [phone]  Physical Therapy Treatment  Patient Details  Name: Ricardo Stewart MRN: [id number] Date of Birth: [birth year] Referring Provider (PT): Laurence Spates   Encounter Date: 03/21/2019  PT End of Session - 03/21/19 1151    Visit Number  36    Date for PT Re-Evaluation  04/04/19    Authorization Type  [employer]    PT Start Time  0926    PT Stop Time  1010    PT Time Calculation (min)  44 min    Activity Tolerance  Patient tolerated treatment well    Behavior During Therapy  [facility] for tasks assessed/performed       History reviewed. No pertinent past medical history.  History reviewed. No pertinent surgical history.  There were no vitals filed for this visit.  Subjective Assessment - 03/21/19 1148    Subjective  Patient comes in with increased limp, reports that he had to go to [employer] and do a lot of walking and it has had increased pain since, c/o increased anterior pain    Currently in Pain?  Yes    Pain Score  6     Pain Location  Hip    Pain Orientation  Right;Anterior    Aggravating Factors   activity                       [facility] Adult PT Treatment/Exercise - 03/21/19 0001      Modalities   Modalities  Electrical Stimulation;Moist Heat;Traction      Moist Heat Therapy   Number Minutes Moist Heat  15 Minutes    Moist Heat Location  Hip      Electrical Stimulation   Electrical Stimulation Location  right anterior hip    Electrical Stimulation Action  IFC    Electrical Stimulation Parameters  supine    Electrical Stimulation Goals  Pain      Manual Therapy   Manual Therapy  Soft tissue mobilization;Passive ROM    Soft tissue mobilization  worked on the right hip flexor and quad area into the right ITB    Passive ROM  to the bilateral hip stretches in all motions of the right hip               PT  Short Term Goals - 10/05/18 0916      PT SHORT TERM GOAL #1   Title  independent with HEP    Status  Achieved        PT Long Term Goals - 03/21/19 1153      PT LONG TERM GOAL #1   Title  report no pain when getting up in the AM    Status  Partially Met      PT LONG TERM GOAL #2   Title  walk without issue his 3-4 miles    Status  On-going            Plan - 03/21/19 1151    Clinical Impression Statement  Patient iwth increased limp on the right today, he reports more activity at [employer], he has more pain in the right anterior area and is tender here.  He reports that he has called the MD and is awaiting return call    PT Next Visit Plan  may need reauthorization if we are to continue    Consulted and  Agree with Plan of Care  Patient       Patient will benefit from skilled therapeutic intervention in order to improve the following deficits and impairments:  Abnormal gait, Decreased mobility, Impaired flexibility, Improper body mechanics, Pain, Increased muscle spasms, Increased fascial restricitons, Difficulty walking, Decreased range of motion  Visit Diagnosis: 1. Acute bilateral low back pain with left-sided sciatica   2. Pain in right hip   3. Pain in left hip        Problem List Patient Active Problem List   Diagnosis Date Noted  . Lumbar radiculopathy 09/24/2018  . Foraminal stenosis of lumbar region 09/24/2018  . Spondylosis without myelopathy or radiculopathy, lumbar region 08/29/2016  . Chronic left-sided low back pain without sciatica 08/29/2016    Sumner Boast., PT 03/21/2019, 11:53 AM  [address] [place], [place], [address] Phone: [phone]   Fax:  [phone]  Name: Ricardo Stewart MRN: [id number] Date of Birth: [birth year]

## 2019-03-22 NOTE — Telephone Encounter (Signed)
Left L5 TF esi, Could consider spine surgeon for foraminotomy if that is source of pain, he has stenosis of L5 foramin, could consider spinal cord stimulator. Myself or Dr. Junius Roads tweaking medication. Continue PT. Consider also EMG test that could help determine if nerve damage exists which would then make you lean towards a surgery.

## 2019-03-22 NOTE — Telephone Encounter (Signed)
Auth needed. Scheduled for 7/29.

## 2019-03-22 NOTE — Telephone Encounter (Signed)
Patient left another message stating that he did not receive a call back and he is wanting to know what his next steps are since his ESI last week did not "solve his problem." Please advise.

## 2019-03-25 ENCOUNTER — Ambulatory Visit: Payer: Medicare HMO | Admitting: Physical Therapy

## 2019-03-25 ENCOUNTER — Other Ambulatory Visit: Payer: Self-pay

## 2019-03-25 ENCOUNTER — Encounter: Payer: Self-pay | Admitting: Physical Therapy

## 2019-03-25 DIAGNOSIS — M25552 Pain in left hip: Secondary | ICD-10-CM

## 2019-03-25 DIAGNOSIS — M25551 Pain in right hip: Secondary | ICD-10-CM | POA: Diagnosis not present

## 2019-03-25 DIAGNOSIS — M5442 Lumbago with sciatica, left side: Secondary | ICD-10-CM

## 2019-03-25 DIAGNOSIS — M25512 Pain in left shoulder: Secondary | ICD-10-CM | POA: Diagnosis not present

## 2019-03-25 NOTE — Therapy (Signed)
[place] [place], [place], [address] Phone: [phone]   Fax:  [phone]  Physical Therapy Treatment  Patient Details  Name: Ona Rathert MRN: [id number] Date of Birth: Jan 12, 1946 Referring Provider (PT): Laurence Spates   Encounter Date: 03/25/2019  PT End of Session - 03/25/19 1159    Visit Number  37    Date for PT Re-Evaluation  04/04/19    Authorization Type  [employer]    PT Start Time  0930    PT Stop Time  1016    PT Time Calculation (min)  46 min    Activity Tolerance  Patient tolerated treatment well    Behavior During Therapy  [facility] for tasks assessed/performed       History reviewed. No pertinent past medical history.  History reviewed. No pertinent surgical history.  There were no vitals filed for this visit.  Subjective Assessment - 03/25/19 0931    Subjective  Patient reports that he woke up with calf cramps, still with right hip flexor pain    Currently in Pain?  Yes    Pain Score  7     Pain Location  Hip    Pain Orientation  Right;Anterior    Pain Relieving Factors  I get relief for awhile                       [facility] Adult PT Treatment/Exercise - 03/25/19 0001      Lumbar Exercises: Stretches   Passive Hamstring Stretch  4 reps;20 seconds    Hip Flexor Stretch  Right;4 reps;20 seconds    ITB Stretch  4 reps;20 seconds    Piriformis Stretch  4 reps;20 seconds    Other Lumbar Stretch Exercise  quad stretch      Modalities   Modalities  Electrical Stimulation;Moist Heat;Traction      Moist Heat Therapy   Number Minutes Moist Heat  15 Minutes    Moist Heat Location  Hip      Electrical Stimulation   Electrical Stimulation Location  right anterior hip    Electrical Stimulation Action  IFC    Electrical Stimulation Parameters  supine    Electrical Stimulation Goals  Pain      Manual Therapy   Manual Therapy  Soft tissue mobilization;Passive ROM    Soft  tissue mobilization  worked on the right hip flexor and quad area into the right ITB    Passive ROM  to the bilateral hip stretches in all motions of the right hip    Manual Traction  use of mobilization belt for some MWM, some right hip joint distraction in various directions and with varied motions to create space               PT Short Term Goals - 10/05/18 0916      PT SHORT TERM GOAL #1   Title  independent with HEP    Status  Achieved        PT Long Term Goals - 03/21/19 1153      PT LONG TERM GOAL #1   Title  report no pain when getting up in the AM    Status  Partially Met      PT LONG TERM GOAL #2   Title  walk without issue his 3-4 miles    Status  On-going            Plan - 03/25/19 1200  Clinical Impression Statement  Patient very antalgic on the right, he reports that it his right calf cramping more today, it is still very poor pattern.  This right hip is is only isse now, he originally came for the left hip and the left shoulder, but those issues are resolved.  He is very tight and has difficulty relaxing and allowing stretch    PT Next Visit Plan  will submit authorization paperwork to [facility]       Patient will benefit from skilled therapeutic intervention in order to improve the following deficits and impairments:  Abnormal gait, Decreased mobility, Impaired flexibility, Improper body mechanics, Pain, Increased muscle spasms, Increased fascial restricitons, Difficulty walking, Decreased range of motion  Visit Diagnosis: 1. Acute bilateral low back pain with left-sided sciatica   2. Pain in right hip   3. Pain in left hip   4. Acute pain of left shoulder        Problem List Patient Active Problem List   Diagnosis Date Noted  . Lumbar radiculopathy 09/24/2018  . Foraminal stenosis of lumbar region 09/24/2018  . Spondylosis without myelopathy or radiculopathy, lumbar region 08/29/2016  . Chronic left-sided low back pain without sciatica  08/29/2016    Sumner Boast., PT 03/25/2019, 12:02 PM  [address] [place], [place], [address] Phone: [phone]   Fax:  [phone]  Name: Lennyn Bellanca MRN: [id number] Date of Birth: 11/23/1945

## 2019-03-26 NOTE — Telephone Encounter (Signed)
Your authorization request is approved. Your interim reference number for this approval is: [id number]  Status Auto-Approved Decision Approved Effective Date 03/26/2019 Expiration Date 06/24/2019

## 2019-03-27 ENCOUNTER — Encounter: Payer: Self-pay | Admitting: Physical Therapy

## 2019-03-27 ENCOUNTER — Ambulatory Visit: Payer: Medicare HMO | Admitting: Physical Therapy

## 2019-03-27 ENCOUNTER — Other Ambulatory Visit: Payer: Self-pay

## 2019-03-27 DIAGNOSIS — M5442 Lumbago with sciatica, left side: Secondary | ICD-10-CM

## 2019-03-27 DIAGNOSIS — M25551 Pain in right hip: Secondary | ICD-10-CM

## 2019-03-27 DIAGNOSIS — M25552 Pain in left hip: Secondary | ICD-10-CM | POA: Diagnosis not present

## 2019-03-27 DIAGNOSIS — M25512 Pain in left shoulder: Secondary | ICD-10-CM | POA: Diagnosis not present

## 2019-03-27 NOTE — Therapy (Signed)
[place] [address] [place], [place], [address] Phone: [phone]   Fax:  [phone]  Physical Therapy Treatment  Patient Details  Name: Ricardo Stewart MRN: [id number] Date of Birth: [birth year] Referring Provider (PT): Laurence Spates   Encounter Date: 03/27/2019  PT End of Session - 03/27/19 1004    Visit Number  62    Date for PT Re-Evaluation  04/04/19    Authorization Type  [employer]    PT Start Time  0930    PT Stop Time  1016    PT Time Calculation (min)  46 min    Activity Tolerance  Patient tolerated treatment well    Behavior During Therapy  [facility] for tasks assessed/performed       History reviewed. No pertinent past medical history.  History reviewed. No pertinent surgical history.  There were no vitals filed for this visit.  Subjective Assessment - 03/27/19 1003    Subjective  Moving better and feeling a little better    Currently in Pain?  Yes    Pain Score  4     Pain Location  Hip    Pain Orientation  Right    Aggravating Factors   walking                       [facility] Adult PT Treatment/Exercise - 03/27/19 0001      Lumbar Exercises: Stretches   Passive Hamstring Stretch  4 reps;20 seconds    Hip Flexor Stretch  Right;4 reps;20 seconds    ITB Stretch  4 reps;20 seconds    Piriformis Stretch  4 reps;20 seconds    Other Lumbar Stretch Exercise  quad stretch      Modalities   Modalities  Electrical Stimulation;Moist Heat;Traction      Moist Heat Therapy   Number Minutes Moist Heat  15 Minutes    Moist Heat Location  Hip      Electrical Stimulation   Electrical Stimulation Location  right anterior hip    Electrical Stimulation Action  IFC    Electrical Stimulation Parameters  supine    Electrical Stimulation Goals  Pain      Manual Therapy   Manual Therapy  Soft tissue mobilization;Passive ROM    Soft tissue mobilization  worked on the right hip flexor and quad area into  the right ITB    Passive ROM  to the bilateral hip stretches in all motions of the right hip    Manual Traction  use of mobilization belt for some MWM, some right hip joint distraction in various directions and with varied motions to create space               PT Short Term Goals - 10/05/18 0916      PT SHORT TERM GOAL #1   Title  independent with HEP    Status  Achieved        PT Long Term Goals - 03/27/19 1006      PT LONG TERM GOAL #1   Title  report no pain when getting up in the AM    Status  Partially Met      PT LONG TERM GOAL #2   Title  walk without issue his 3-4 miles    Status  Partially Met            Plan - 03/27/19 1005    Clinical Impression Statement  Patient with less antalgic  gait today, still stiff and antalgic.  He is c/o more pain in the anterior and the groin area, he was painful with stretches.  Still difficulty with any weight bearing    PT Next Visit Plan  see if we get more visits seems a little better today    Consulted and Agree with Plan of Care  Patient       Patient will benefit from skilled therapeutic intervention in order to improve the following deficits and impairments:  Abnormal gait, Decreased mobility, Impaired flexibility, Improper body mechanics, Pain, Increased muscle spasms, Increased fascial restricitons, Difficulty walking, Decreased range of motion  Visit Diagnosis: 1. Acute bilateral low back pain with left-sided sciatica   2. Pain in right hip        Problem List Patient Active Problem List   Diagnosis Date Noted  . Lumbar radiculopathy 09/24/2018  . Foraminal stenosis of lumbar region 09/24/2018  . Spondylosis without myelopathy or radiculopathy, lumbar region 08/29/2016  . Chronic left-sided low back pain without sciatica 08/29/2016    Sumner Boast., PT 03/27/2019, 10:07 AM  [address] [place], [place],  [address] Phone: [phone]   Fax:  [phone]  Name: Ricardo Stewart MRN: [id number] Date of Birth: [birth year]

## 2019-04-02 ENCOUNTER — Encounter: Payer: Self-pay | Admitting: Physical Therapy

## 2019-04-02 ENCOUNTER — Other Ambulatory Visit: Payer: Self-pay

## 2019-04-02 ENCOUNTER — Ambulatory Visit: Payer: Medicare HMO | Admitting: Physical Therapy

## 2019-04-02 DIAGNOSIS — M25552 Pain in left hip: Secondary | ICD-10-CM | POA: Diagnosis not present

## 2019-04-02 DIAGNOSIS — M5442 Lumbago with sciatica, left side: Secondary | ICD-10-CM

## 2019-04-02 DIAGNOSIS — M25551 Pain in right hip: Secondary | ICD-10-CM

## 2019-04-02 DIAGNOSIS — M25512 Pain in left shoulder: Secondary | ICD-10-CM | POA: Diagnosis not present

## 2019-04-02 NOTE — Therapy (Signed)
[address] [place], [place], [address] Phone: [phone]   Fax:  [phone]  Physical Therapy Treatment  Patient Details  Name: Ricardo Stewart MRN: [id number] Date of Birth: 04-Jun-1946 Referring Provider (PT): Laurence Spates   Encounter Date: 04/02/2019  PT End of Session - 04/02/19 1202    Visit Number  39    Date for PT Re-Evaluation  04/04/19    Authorization Type  [employer]    PT Start Time  0843    PT Stop Time  0933    PT Time Calculation (min)  50 min    Activity Tolerance  Patient tolerated treatment well    Behavior During Therapy  [facility] for tasks assessed/performed       History reviewed. No pertinent past medical history.  History reviewed. No pertinent surgical history.  There were no vitals filed for this visit.  Subjective Assessment - 04/02/19 0918    Subjective  Patient reports that he is having another injection next week.  He is limping pretty bad on the right    Currently in Pain?  Yes    Pain Score  5     Pain Location  Hip    Pain Orientation  Right;Anterior    Aggravating Factors   weight bearing                       [facility] Adult PT Treatment/Exercise - 04/02/19 0001      Modalities   Modalities  Electrical Stimulation;Moist Heat;Traction      Moist Heat Therapy   Number Minutes Moist Heat  15 Minutes    Moist Heat Location  Hip      Electrical Stimulation   Electrical Stimulation Location  right anterior hip    Electrical Stimulation Action  IFC    Electrical Stimulation Parameters  supine    Electrical Stimulation Goals  Pain      Manual Therapy   Manual Therapy  Soft tissue mobilization;Passive ROM    Soft tissue mobilization  worked on the right hip flexor and quad area into the right ITB    Passive ROM  to the bilateral hip stretches in all motions of the right hip    Manual Traction  use of mobilization belt for some MWM, some right hip joint  distraction in various directions and with varied motions to create space               PT Short Term Goals - 10/05/18 0916      PT SHORT TERM GOAL #1   Title  independent with HEP    Status  Achieved        PT Long Term Goals - 03/27/19 1006      PT LONG TERM GOAL #1   Title  report no pain when getting up in the AM    Status  Partially Met      PT LONG TERM GOAL #2   Title  walk without issue his 3-4 miles    Status  Partially Met            Plan - 04/02/19 1203    Clinical Impression Statement  Patient very antalgic today.  Having the same pain in the anterior hip with weight bearing, will have an injection next week.    PT Next Visit Plan  see if we get more visits seems a little better today    Consulted and  Agree with Plan of Care  Patient       Patient will benefit from skilled therapeutic intervention in order to improve the following deficits and impairments:  Abnormal gait, Decreased mobility, Impaired flexibility, Improper body mechanics, Pain, Increased muscle spasms, Increased fascial restricitons, Difficulty walking, Decreased range of motion  Visit Diagnosis: 1. Acute bilateral low back pain with left-sided sciatica   2. Pain in right hip   3. Pain in left hip        Problem List Patient Active Problem List   Diagnosis Date Noted  . Lumbar radiculopathy 09/24/2018  . Foraminal stenosis of lumbar region 09/24/2018  . Spondylosis without myelopathy or radiculopathy, lumbar region 08/29/2016  . Chronic left-sided low back pain without sciatica 08/29/2016    Sumner Boast., PT 04/02/2019, 12:04 PM  [address] [place], [place], [address] Phone: [phone]   Fax:  [phone]  Name: Ricardo Stewart MRN: [id number] Date of Birth: 02-Jan-1946

## 2019-04-05 ENCOUNTER — Encounter: Payer: Self-pay | Admitting: Physical Therapy

## 2019-04-05 ENCOUNTER — Ambulatory Visit: Payer: Medicare HMO | Admitting: Physical Therapy

## 2019-04-05 ENCOUNTER — Other Ambulatory Visit: Payer: Self-pay

## 2019-04-05 DIAGNOSIS — M5442 Lumbago with sciatica, left side: Secondary | ICD-10-CM

## 2019-04-05 DIAGNOSIS — M25551 Pain in right hip: Secondary | ICD-10-CM

## 2019-04-05 DIAGNOSIS — M25552 Pain in left hip: Secondary | ICD-10-CM | POA: Diagnosis not present

## 2019-04-05 DIAGNOSIS — M25512 Pain in left shoulder: Secondary | ICD-10-CM | POA: Diagnosis not present

## 2019-04-05 NOTE — Therapy (Signed)
[place], [place], [address] Phone: [phone]   Fax:  [phone] Progress Note Reporting Period 6/23/20to 04/05/19  See note below for Objective Data and Assessment of Progress/Goals.      Patient Details  Name: Ricardo Stewart MRN: [id number] Date of Birth: [birth year] Referring Provider (PT): Laurence Spates   Encounter Date: 04/05/2019  PT End of Session - 04/05/19 1009    Visit Number  40    Date for PT Re-Evaluation  05/06/19    Authorization Type  [employer]    PT Start Time  0930    PT Stop Time  1017    PT Time Calculation (min)  47 min    Activity Tolerance  Patient tolerated treatment well    Behavior During Therapy  [facility] for tasks assessed/performed       History reviewed. No pertinent past medical history.  History reviewed. No pertinent surgical history.  There were no vitals filed for this visit.  Subjective Assessment - 04/05/19 1005    Subjective  Patient reports that he felt pretty good after the last visit, less limp today but is still limping    Currently in Pain?  Yes    Pain Score  3     Pain Location  Hip    Pain Orientation  Right;Anterior                       [facility] Adult PT Treatment/Exercise - 04/05/19 0001      Modalities   Modalities  Electrical Stimulation;Moist Heat      Moist Heat Therapy   Number Minutes Moist Heat  15 Minutes    Moist Heat Location  Hip      Electrical Stimulation   Electrical Stimulation Location  right anterior hip    Electrical Stimulation Action  IFC    Electrical Stimulation Parameters  supine    Electrical Stimulation Goals  Pain      Manual Therapy   Manual Therapy  Soft tissue mobilization;Passive ROM    Soft tissue mobilization  worked on the right hip flexor and quad area into the right ITB    Passive ROM  to the bilateral hip stretches in all motions of the right hip    Manual Traction  use of  mobilization belt for some MWM, some right hip joint distraction in various directions and with varied motions to create space               PT Short Term Goals - 10/05/18 0916      PT SHORT TERM GOAL #1   Title  independent with HEP    Status  Achieved        PT Long Term Goals - 04/05/19 1011      PT LONG TERM GOAL #1   Title  report no pain when getting up in the AM    Status  Partially Met      PT LONG TERM GOAL #2   Title  walk without issue his 3-4 miles    Status  Partially Met      PT LONG TERM GOAL #3   Title  increase SLR to 60 degrees    Status  Achieved      PT LONG TERM GOAL #4   Title  understand posture and body mechanics    Status  Achieved  Plan - 04/05/19 1010    Clinical Impression Statement  Patient with less antalgic gait, he really has pain with the passive stretche of the ITB and the piriformis on the right, he reports that he thinks he stretches too much at home and sets him self back, I asked him to not do much stretches this weekend and see how he does.  The right TFL seems very tight and in spasm    PT Next Visit Plan  will continue to work on the TFL may try DN    Consulted and Agree with Plan of Care  Patient       Patient will benefit from skilled therapeutic intervention in order to improve the following deficits and impairments:  Abnormal gait, Decreased mobility, Impaired flexibility, Improper body mechanics, Pain, Increased muscle spasms, Increased fascial restricitons, Difficulty walking, Decreased range of motion  Visit Diagnosis: 1. Acute bilateral low back pain with left-sided sciatica   2. Pain in right hip   3. Pain in left hip        Problem List Patient Active Problem List   Diagnosis Date Noted  . Lumbar radiculopathy 09/24/2018  . Foraminal stenosis of lumbar region 09/24/2018  . Spondylosis without myelopathy or radiculopathy, lumbar region 08/29/2016  . Chronic left-sided low back pain  without sciatica 08/29/2016    Sumner Boast., PT 04/05/2019, 10:13 AM  [place], [place], [address] Phone: [phone]   Fax:  [phone]  Name: Ricardo Stewart MRN: [id number] Date of Birth: 1946/08/22

## 2019-04-09 ENCOUNTER — Other Ambulatory Visit: Payer: Self-pay

## 2019-04-09 ENCOUNTER — Ambulatory Visit: Payer: Medicare HMO | Admitting: Physical Therapy

## 2019-04-09 ENCOUNTER — Encounter: Payer: Self-pay | Admitting: Physical Therapy

## 2019-04-09 DIAGNOSIS — M5442 Lumbago with sciatica, left side: Secondary | ICD-10-CM | POA: Diagnosis not present

## 2019-04-09 DIAGNOSIS — M25512 Pain in left shoulder: Secondary | ICD-10-CM | POA: Diagnosis not present

## 2019-04-09 DIAGNOSIS — M25552 Pain in left hip: Secondary | ICD-10-CM | POA: Diagnosis not present

## 2019-04-09 DIAGNOSIS — M25551 Pain in right hip: Secondary | ICD-10-CM | POA: Diagnosis not present

## 2019-04-09 NOTE — Therapy (Signed)
[place] [place], [place], [address] Phone: [phone]   Fax:  [phone]  Physical Therapy Treatment  Patient Details  Name: Ricardo Stewart MRN: [id number] Date of Birth: 07-06-1946 Referring Provider (PT): Laurence Spates   Encounter Date: 04/09/2019  PT End of Session - 04/09/19 1058    Visit Number  41    Date for PT Re-Evaluation  05/06/19    Authorization Type  [employer]    PT Start Time  0929    PT Stop Time  1015    PT Time Calculation (min)  46 min    Activity Tolerance  Patient tolerated treatment well    Behavior During Therapy  [facility] for tasks assessed/performed       History reviewed. No pertinent past medical history.  History reviewed. No pertinent surgical history.  There were no vitals filed for this visit.  Subjective Assessment - 04/09/19 1010    Subjective  Patient is reporting doing okay, still pain more in the rigght TFL area, he is haivng an injection tomorrrow so elected not to change anything we are doing to see if the injection helps as well as we are not going to see hinm this week after the injection    Currently in Pain?  Yes    Pain Score  3     Pain Location  Hip    Pain Orientation  Right;Anterior    Aggravating Factors   walking                       [facility] Adult PT Treatment/Exercise - 04/09/19 0001      Moist Heat Therapy   Number Minutes Moist Heat  15 Minutes    Moist Heat Location  Hip      Electrical Stimulation   Electrical Stimulation Location  right anterior hip    Electrical Stimulation Action  IFC    Electrical Stimulation Parameters  supine    Electrical Stimulation Goals  Pain      Ultrasound   Ultrasound Location  '      Manual Therapy   Manual Therapy  Soft tissue mobilization;Passive ROM    Soft tissue mobilization  worked on the right hip flexor and quad area into the right ITB    Passive ROM  to the bilateral hip stretches in  all motions of the right hip               PT Short Term Goals - 10/05/18 0916      PT SHORT TERM GOAL #1   Title  independent with HEP    Status  Achieved        PT Long Term Goals - 04/09/19 1100      PT LONG TERM GOAL #1   Title  report no pain when getting up in the AM    Status  Partially Met            Plan - 04/09/19 1059    Clinical Impression Statement  Patient his very tight in the TFL, I have discussed DN with him but he is having an injection tomorrow so I did not want to change anything prior to this.    PT Next Visit Plan  see how the injection went, I cautioned him on activites after injection as he has done too much in the past after injection    Consulted and Agree with Plan of Care  Patient       Patient will benefit from skilled therapeutic intervention in order to improve the following deficits and impairments:  Abnormal gait, Decreased mobility, Impaired flexibility, Improper body mechanics, Pain, Increased muscle spasms, Increased fascial restricitons, Difficulty walking, Decreased range of motion  Visit Diagnosis: 1. Acute bilateral low back pain with left-sided sciatica   2. Pain in right hip        Problem List Patient Active Problem List   Diagnosis Date Noted  . Lumbar radiculopathy 09/24/2018  . Foraminal stenosis of lumbar region 09/24/2018  . Spondylosis without myelopathy or radiculopathy, lumbar region 08/29/2016  . Chronic left-sided low back pain without sciatica 08/29/2016    Sumner Boast., PT 04/09/2019, 11:01 AM  [address] [place], [place], [address] Phone: [phone]   Fax:  [phone]  Name: Keelan Tripodi MRN: [id number] Date of Birth: [birth year]

## 2019-04-10 ENCOUNTER — Ambulatory Visit: Payer: Self-pay

## 2019-04-10 ENCOUNTER — Other Ambulatory Visit: Payer: Self-pay

## 2019-04-10 ENCOUNTER — Ambulatory Visit (INDEPENDENT_AMBULATORY_CARE_PROVIDER_SITE_OTHER): Payer: Medicare HMO | Admitting: Physical Medicine and Rehabilitation

## 2019-04-10 VITALS — BP 134/76 | HR 62

## 2019-04-10 DIAGNOSIS — M5416 Radiculopathy, lumbar region: Secondary | ICD-10-CM | POA: Diagnosis not present

## 2019-04-10 DIAGNOSIS — M48061 Spinal stenosis, lumbar region without neurogenic claudication: Secondary | ICD-10-CM | POA: Diagnosis not present

## 2019-04-10 MED ORDER — BETAMETHASONE SOD PHOS & ACET 6 (3-3) MG/ML IJ SUSP
12.0000 mg | Freq: Once | INTRAMUSCULAR | Status: AC
  Administered 2019-04-10: 12 mg

## 2019-04-10 NOTE — Progress Notes (Signed)
 .  Numeric Pain Rating Scale and Functional Assessment Average Pain 7   In the last MONTH (on 0-10 scale) has pain interfered with the following?  1. General activity like being  able to carry out your everyday physical activities such as walking, climbing stairs, carrying groceries, or moving a chair?  Rating(6)   +Driver, -BT, -Dye Allergies.  

## 2019-04-15 ENCOUNTER — Telehealth: Payer: Self-pay | Admitting: Physical Medicine and Rehabilitation

## 2019-04-15 NOTE — Telephone Encounter (Signed)
Ok first avail, 15 min

## 2019-04-16 ENCOUNTER — Other Ambulatory Visit: Payer: Self-pay

## 2019-04-16 ENCOUNTER — Ambulatory Visit: Payer: Medicare HMO | Attending: Family Medicine | Admitting: Physical Therapy

## 2019-04-16 ENCOUNTER — Encounter: Payer: Self-pay | Admitting: Physical Therapy

## 2019-04-16 DIAGNOSIS — M25551 Pain in right hip: Secondary | ICD-10-CM | POA: Diagnosis not present

## 2019-04-16 DIAGNOSIS — M25512 Pain in left shoulder: Secondary | ICD-10-CM | POA: Diagnosis not present

## 2019-04-16 DIAGNOSIS — M5442 Lumbago with sciatica, left side: Secondary | ICD-10-CM | POA: Diagnosis not present

## 2019-04-16 DIAGNOSIS — M25552 Pain in left hip: Secondary | ICD-10-CM | POA: Insufficient documentation

## 2019-04-16 NOTE — Telephone Encounter (Signed)
Scheduled for 8/10.

## 2019-04-16 NOTE — Telephone Encounter (Signed)
Called patient to schedule. No answer and voicemail not set up.

## 2019-04-16 NOTE — Patient Instructions (Signed)

## 2019-04-16 NOTE — Therapy (Signed)
[place], [place], [address] Phone: [phone]   Fax:  [phone]  Physical Therapy Treatment  Patient Details  Name: Ricardo Stewart MRN: [id number] Date of Birth: [birth year] Referring Provider (PT): Laurence Spates   Encounter Date: 04/16/2019  PT End of Session - 04/16/19 0923    Visit Number  42    Date for PT Re-Evaluation  05/06/19    Authorization Type  [employer]    PT Start Time  0845    PT Stop Time  0933    PT Time Calculation (min)  48 min    Activity Tolerance  Patient tolerated treatment well    Behavior During Therapy  [facility] for tasks assessed/performed       History reviewed. No pertinent past medical history.  History reviewed. No pertinent surgical history.  There were no vitals filed for this visit.  Subjective Assessment - 04/16/19 0921    Subjective  Patient had injection into L5 last week, he reports a day or so of some relief, but not much, I think he is walking a little better but still limping significantly.    Currently in Pain?  Yes    Pain Score  4     Pain Location  Hip    Pain Orientation  Right;Anterior    Pain Descriptors / Indicators  Sore;Tightness    Aggravating Factors   walking increases his pain                       [facility] Adult PT Treatment/Exercise - 04/16/19 0001      Moist Heat Therapy   Number Minutes Moist Heat  15 Minutes    Moist Heat Location  Hip      Electrical Stimulation   Electrical Stimulation Location  right anterior hip    Electrical Stimulation Action  IFC    Electrical Stimulation Parameters  supine    Electrical Stimulation Goals  Pain      Manual Therapy   Manual Therapy  Soft tissue mobilization;Passive ROM    Soft tissue mobilization  worked on the right hip flexor and quad area into the right ITB    Passive ROM  to the bilateral hip stretches in all motions of the right hip       Trigger Point Dry Needling -  04/16/19 0001    Consent Given?  Yes    Education Handout Provided  Yes    Muscles Treated Back/Hip  Tensor fascia lata    Tensor Fascia Lata Response  Palpable increased muscle length;Twitch response elicited             PT Short Term Goals - 10/05/18 0916      PT SHORT TERM GOAL #1   Title  independent with HEP    Status  Achieved        PT Long Term Goals - 04/09/19 1100      PT LONG TERM GOAL #1   Title  report no pain when getting up in the AM    Status  Partially Met            Plan - 04/16/19 0924    Clinical Impression Statement  I added TpDN to the right TFL area, got a few good LTR's.  This area is tight but is very sensitive to stretch and STM as at times it will increase the pain    PT Next Visit Plan  see if there is any results with DN    Consulted and Agree with Plan of Care  Patient       Patient will benefit from skilled therapeutic intervention in order to improve the following deficits and impairments:  Abnormal gait, Decreased mobility, Impaired flexibility, Improper body mechanics, Pain, Increased muscle spasms, Increased fascial restricitons, Difficulty walking, Decreased range of motion  Visit Diagnosis: 1. Acute bilateral low back pain with left-sided sciatica   2. Pain in right hip   3. Pain in left hip        Problem List Patient Active Problem List   Diagnosis Date Noted  . Lumbar radiculopathy 09/24/2018  . Foraminal stenosis of lumbar region 09/24/2018  . Spondylosis without myelopathy or radiculopathy, lumbar region 08/29/2016  . Chronic left-sided low back pain without sciatica 08/29/2016    Sumner Boast., PT 04/16/2019, 9:25 AM  [address] [address] [place], [place], [address] Phone: [phone]   Fax:  [phone]  Name: Ricardo Stewart MRN: [id number] Date of Birth: 1946/06/14

## 2019-04-18 ENCOUNTER — Other Ambulatory Visit: Payer: Self-pay

## 2019-04-18 ENCOUNTER — Ambulatory Visit: Payer: Medicare HMO | Admitting: Physical Therapy

## 2019-04-18 ENCOUNTER — Encounter: Payer: Self-pay | Admitting: Physical Therapy

## 2019-04-18 DIAGNOSIS — M25551 Pain in right hip: Secondary | ICD-10-CM

## 2019-04-18 DIAGNOSIS — M25552 Pain in left hip: Secondary | ICD-10-CM | POA: Diagnosis not present

## 2019-04-18 DIAGNOSIS — M25512 Pain in left shoulder: Secondary | ICD-10-CM | POA: Diagnosis not present

## 2019-04-18 DIAGNOSIS — M5442 Lumbago with sciatica, left side: Secondary | ICD-10-CM | POA: Diagnosis not present

## 2019-04-18 NOTE — Therapy (Signed)
[place], [place], [address] Phone: [phone]   Fax:  [phone]  Physical Therapy Treatment  Patient Details  Name: Ricardo Stewart MRN: [id number] Date of Birth: [birth year] Referring Provider (PT): Laurence Spates   Encounter Date: 04/18/2019  PT End of Session - 04/18/19 1349    Visit Number  43    Date for PT Re-Evaluation  05/06/19    Authorization Type  [employer]    PT Start Time  1315    PT Stop Time  1403    PT Time Calculation (min)  48 min    Activity Tolerance  Patient tolerated treatment well    Behavior During Therapy  [facility] for tasks assessed/performed       History reviewed. No pertinent past medical history.  History reviewed. No pertinent surgical history.  There were no vitals filed for this visit.  Subjective Assessment - 04/18/19 1317    Subjective  Patient will have right anterior hip injection next week, we will not see him next week, still hurting, no real changes    Currently in Pain?  Yes    Pain Score  5     Pain Location  Hip    Pain Orientation  Right;Anterior                       [facility] Adult PT Treatment/Exercise - 04/18/19 0001      Moist Heat Therapy   Number Minutes Moist Heat  15 Minutes    Moist Heat Location  Hip      Electrical Stimulation   Electrical Stimulation Location  right anterior hip    Electrical Stimulation Action  IFC    Electrical Stimulation Parameters  supine    Electrical Stimulation Goals  Pain      Manual Therapy   Manual Therapy  Soft tissue mobilization;Passive ROM    Soft tissue mobilization  hight hip anterior, adductors and ITB    Passive ROM  to the right hip all motions,     Manual Traction  use of mobilization belt for some MWM, some right hip joint distraction in various directions and with varied motions to create space               PT Short Term Goals - 10/05/18 0916      PT SHORT TERM GOAL  #1   Title  independent with HEP    Status  Achieved        PT Long Term Goals - 04/18/19 1352      PT LONG TERM GOAL #1   Title  report no pain when getting up in the AM    Status  Partially Met      PT LONG TERM GOAL #2   Title  walk without issue his 3-4 miles    Status  On-going      PT LONG TERM GOAL #3   Title  increase SLR to 60 degrees    Status  Achieved            Plan - 04/18/19 1350    Clinical Impression Statement  While moving the right leg around I could feel a popping or a grinding and it is that type where it feels like bone on bone.  I had felt it the past couple of weeks and thought it was the knee and asked him about the knee, however after a few times today  I put a hand on the knee and it was not from the knee it was the right hip, asked him to mention this to the MD, recent xrays showed narrowing and arthritis but it was not felt it was bad.  Hip scour has pain but not significant    PT Next Visit Plan  see how the injection next week goes    Consulted and Agree with Plan of Care  Patient       Patient will benefit from skilled therapeutic intervention in order to improve the following deficits and impairments:  Abnormal gait, Decreased mobility, Impaired flexibility, Improper body mechanics, Pain, Increased muscle spasms, Increased fascial restricitons, Difficulty walking, Decreased range of motion  Visit Diagnosis: 1. Acute bilateral low back pain with left-sided sciatica   2. Pain in right hip        Problem List Patient Active Problem List   Diagnosis Date Noted  . Lumbar radiculopathy 09/24/2018  . Foraminal stenosis of lumbar region 09/24/2018  . Spondylosis without myelopathy or radiculopathy, lumbar region 08/29/2016  . Chronic left-sided low back pain without sciatica 08/29/2016    Sumner Boast., PT 04/18/2019, 1:53 PM  [address] [place],  [place], [address] Phone: [phone]   Fax:  [phone]  Name: Ricardo Stewart MRN: [id number] Date of Birth: [birth year]

## 2019-04-22 ENCOUNTER — Encounter: Payer: Self-pay | Admitting: Physical Medicine and Rehabilitation

## 2019-04-22 ENCOUNTER — Other Ambulatory Visit: Payer: Self-pay

## 2019-04-22 ENCOUNTER — Ambulatory Visit: Payer: Self-pay

## 2019-04-22 ENCOUNTER — Ambulatory Visit (INDEPENDENT_AMBULATORY_CARE_PROVIDER_SITE_OTHER): Payer: Medicare HMO | Admitting: Physical Medicine and Rehabilitation

## 2019-04-22 DIAGNOSIS — M25551 Pain in right hip: Secondary | ICD-10-CM

## 2019-04-22 NOTE — Progress Notes (Signed)
 .  Numeric Pain Rating Scale and Functional Assessment Average Pain 6   In the last MONTH (on 0-10 scale) has pain interfered with the following?  1. General activity like being  able to carry out your everyday physical activities such as walking, climbing stairs, carrying groceries, or moving a chair?  Rating(5)  -Dye Allergies.  

## 2019-04-23 ENCOUNTER — Ambulatory Visit: Payer: Medicare HMO | Admitting: Physical Therapy

## 2019-04-23 DIAGNOSIS — M25551 Pain in right hip: Secondary | ICD-10-CM

## 2019-04-23 MED ORDER — BUPIVACAINE HCL 0.25 % IJ SOLN
4.0000 mL | INTRAMUSCULAR | Status: AC | PRN
  Administered 2019-04-23: 4 mL via INTRA_ARTICULAR

## 2019-04-23 MED ORDER — TRIAMCINOLONE ACETONIDE 40 MG/ML IJ SUSP
80.0000 mg | INTRAMUSCULAR | Status: AC | PRN
  Administered 2019-04-23: 80 mg via INTRA_ARTICULAR

## 2019-04-23 NOTE — Progress Notes (Signed)
Ricardo Stewart [place] - 73 y.o. male MRN [id number]  Date of birth: 01-26-1946  Office Visit Note: Visit Date: 04/22/2019 PCP: Sigmund HazelMiller, Lisa, MD Referred by: Sigmund HazelMiller, Lisa, MD  Subjective: Chief Complaint  Patient presents with  . Right Hip - Pain   HPI:  Ricardo Stewart Barreiro is a 73 y.o. male who comes in today For planned diagnostic and hopefully therapeutic anesthetic hip arthrogram on the right.  Patient is followed by myself as well as Dr. Lavada MesiMichael Hilts.  His history can be reviewed based been having right low back and hip pain somewhat at times into the anterior crease or groin but also appears to be somewhat posterior lateral.  There is been some concern or consternation about whether this is lumbar spine related versus hip related.  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  Prior hip injection performed with ultrasound guidance did give him some relief of his groin pain but not his posterior lateral hip pain at least by his report.  We have completed 2 epidural injections from a transforaminal approach and neither 1 of these is helped at all.  No real diagnostic value and those from a positive standpoint but from a negative standpoint I will really think this is coming from his spine.  Could entertain the idea of the spine is bad enough that the injection would help at all but that usually not the case.  We have had updated MRI of his lumbar spine.  Patient ambulates with an antalgic gait to the right.  He has pain with hip rotation.  We are going to complete fluoroscopically guided anesthetic arthrogram today.  ROS Otherwise per HPI.  Assessment & Plan: Visit Diagnoses:  1. Pain in right hip     Plan: Findings:  Patient had pretty significant relief after the anesthetic hip arthrogram.  He still was ambulating with a antalgic gait but he actually was joking that it may have just been out of habit.  He is going to monitor this for the next few  hours.  If it does give him a lot of relief diagnostically during the anesthetic phase but not much relieved here in the steroid phase then I do think it something he should consider being a total hip arthroplasty.  He is a very active 73 year old gentleman I think he would do well with that.    Meds & Orders: No orders of the defined types were placed in this encounter.   Orders Placed This Encounter  Procedures  . Large Joint Inj  . XR C-ARM NO REPORT    Follow-up: No follow-ups on file.   Procedures: Large Joint Inj: R hip joint on 04/23/2019 6:17 AM Indications: pain and diagnostic evaluation Details: 22 G needle, anterior approach  Arthrogram: Yes  Medications: 80 mg triamcinolone acetonide 40 MG/ML; 4 mL bupivacaine 0.25 % Outcome: tolerated well, no immediate complications  Arthrogram demonstrated excellent flow of contrast throughout the joint surface without extravasation or obvious defect.  The patient had relief of symptoms during the anesthetic phase of the injection.  Procedure, treatment alternatives, risks and benefits explained, specific risks discussed. Consent was given by the patient. Immediately prior to procedure a time out was called to verify the correct patient, procedure, equipment, support staff and site/side marked as required. Patient was prepped and draped in the usual sterile fashion.      No notes on file   Clinical History: MRI LUMBAR SPINE WITHOUT CONTRAST  TECHNIQUE:  Multiplanar, multisequence MR imaging of the lumbar spine was performed. No intravenous contrast was administered.  COMPARISON: 01/20/2011  FINDINGS: Segmentation: Standard.  Alignment: Physiologic.  Vertebrae: No fracture, evidence of discitis, or bone lesion.  Conus medullaris and cauda equina: Conus extends to the L1 level. Conus and cauda equina appear normal.  Paraspinal and other soft tissues: No acute paraspinal abnormality.  Disc levels:  Disc spaces:  Severe degenerative disc disease with disc height loss at L4-5. Mild degenerative disc disease with mild disc height loss at T11-12 and T12-L1. Disc desiccation at L5-S1 and L3-4.  T12-L1: Small right paracentral disc protrusion. No evidence of neural foraminal stenosis. No central canal stenosis.  L1-L2: No significant disc bulge. No evidence of neural foraminal stenosis. No central canal stenosis.  L2-L3: No significant disc bulge. No evidence of neural foraminal stenosis. No central canal stenosis.  L3-L4: Broad-based disc bulge. Mild bilateral facet arthropathy. Mild spinal stenosis. No evidence of neural foraminal stenosis.  L4-L5: Broad-based disc bulge with a right lateral disc osteophyte complex. Moderate bilateral facet arthropathy. Moderate spinal stenosis and bilateral lateral recess stenosis. No left foraminal stenosis. Moderate right foraminal stenosis.  L5-S1: Broad-based disc bulge eccentric towards the left. Severe bilateral facet arthropathy with ligamentum flavum infolding. Left lateral recess stenosis. Mild spinal stenosis. Severe left foraminal stenosis. Mild right foraminal stenosis. 3 mm left facet posterior extra-spinal synovial cyst.  IMPRESSION: 1. At L4-5 there is a broad-based disc bulge with a right lateral disc osteophyte complex. Moderate bilateral facet arthropathy. Moderate spinal stenosis and bilateral lateral recess stenosis. No left foraminal stenosis. Moderate right foraminal stenosis. 2. At L5-S1 there is a broad-based disc bulge eccentric towards the left. Severe bilateral facet arthropathy with ligamentum flavum infolding. Left lateral recess stenosis. Mild spinal stenosis. Severe left foraminal stenosis. Mild right foraminal stenosis. 3 mm left facet posterior extra-spinal synovial cyst. 3. Interval resolution of large intraspinal synovial cyst at L5-S1 and a smaller intraspinal synovial cyst at L4-5.   Electronically Signed  By: Kathreen Devoid On: 02/03/2018 18:33   Lumbar Spine 01/21/2011  MRI LUMBAR SPINE WITHOUT CONTRAST l  IMPRESSION:   1. Spondylosis and degenerative disc disease with dominant findings at the L4-5 and L5-S1 levels. 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 2. 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 If procedural intervention is to be performed, careful correlation with this numbering strategy is recommended.     Objective:  VS:  HT:    WT:   BMI:     BP:   HR: bpm  TEMP: ( )  RESP:  Physical Exam  Ortho Exam Imaging: Xr C-arm No Report  Result Date: 04/22/2019 Please see Notes tab for imaging impression.

## 2019-04-29 ENCOUNTER — Ambulatory Visit: Payer: Medicare HMO | Admitting: Physical Therapy

## 2019-04-29 ENCOUNTER — Telehealth: Payer: Self-pay | Admitting: Physical Medicine and Rehabilitation

## 2019-04-29 ENCOUNTER — Encounter: Payer: Self-pay | Admitting: Physical Therapy

## 2019-04-29 ENCOUNTER — Other Ambulatory Visit: Payer: Self-pay

## 2019-04-29 DIAGNOSIS — M25512 Pain in left shoulder: Secondary | ICD-10-CM

## 2019-04-29 DIAGNOSIS — M25551 Pain in right hip: Secondary | ICD-10-CM

## 2019-04-29 DIAGNOSIS — M5442 Lumbago with sciatica, left side: Secondary | ICD-10-CM | POA: Diagnosis not present

## 2019-04-29 DIAGNOSIS — M25552 Pain in left hip: Secondary | ICD-10-CM | POA: Diagnosis not present

## 2019-04-29 NOTE — Therapy (Signed)
[place] [address] [place] [place], [place], [address] Phone: [phone]   Fax:  [phone]  Physical Therapy Treatment  Patient Details  Name: Ricardo Stewart MRN: [id number] Date of Birth: 06-29-1946 Referring Provider (PT): Laurence Spates   Encounter Date: 04/29/2019  PT End of Session - 04/29/19 1050    Visit Number  27    Date for PT Re-Evaluation  05/06/19    Authorization Type  [employer]    PT Start Time  1015    PT Stop Time  1105    PT Time Calculation (min)  50 min    Activity Tolerance  Patient tolerated treatment well    Behavior During Therapy  [facility] for tasks assessed/performed       History reviewed. No pertinent past medical history.  History reviewed. No pertinent surgical history.  There were no vitals filed for this visit.  Subjective Assessment - 04/29/19 1048    Subjective  Patient had the injection last week in the anterior right hip, he reports that he thinks it helped some, he is still limping significantly on the right.    Currently in Pain?  Yes    Pain Score  3     Pain Location  Hip    Pain Orientation  Right;Anterior    Aggravating Factors   walking, he does report trying to walk a 1/4 mile over the weekend and it causing pain in the hip    Pain Relieving Factors  the injection helped                       [facility] Adult PT Treatment/Exercise - 04/29/19 0001      Moist Heat Therapy   Number Minutes Moist Heat  15 Minutes    Moist Heat Location  Hip      Electrical Stimulation   Electrical Stimulation Location  right anterior hip    Electrical Stimulation Action  IFC    Electrical Stimulation Parameters  supine    Electrical Stimulation Goals  Pain      Manual Therapy   Manual Therapy  Soft tissue mobilization;Passive ROM    Soft tissue mobilization  right hip anterior, adductors and ITB    Passive ROM  to the right hip all motions,                PT Short  Term Goals - 10/05/18 0916      PT SHORT TERM GOAL #1   Title  independent with HEP    Status  Achieved        PT Long Term Goals - 04/18/19 1352      PT LONG TERM GOAL #1   Title  report no pain when getting up in the AM    Status  Partially Met      PT LONG TERM GOAL #2   Title  walk without issue his 3-4 miles    Status  On-going      PT LONG TERM GOAL #3   Title  increase SLR to 60 degrees    Status  Achieved            Plan - 04/29/19 1051    Clinical Impression Statement  Patient had injection in the anterior right hip last week, reports that he had some less pain, he does report trying to walk 1/4 daily oiver the weekend and this causing increaesd pain in the hip.  He seems  to be looser but the PROM still causes pain prior to end ranges.  He is very tender in the right anterior/lateral hip    PT Treatment/Interventions  ADLs/Self Care Home Management;Cryotherapy;Electrical Stimulation;Moist Heat;Iontophoresis 78m/ml Dexamethasone;Gait training;Therapeutic exercise;Therapeutic activities;Patient/family education;Manual techniques;Traction;Dry needling    PT Next Visit Plan  may try to start some gentle exercises    Consulted and Agree with Plan of Care  Patient       Patient will benefit from skilled therapeutic intervention in order to improve the following deficits and impairments:  Abnormal gait, Decreased mobility, Impaired flexibility, Improper body mechanics, Pain, Increased muscle spasms, Increased fascial restricitons, Difficulty walking, Decreased range of motion  Visit Diagnosis: 1. Acute bilateral low back pain with left-sided sciatica   2. Pain in right hip   3. Pain in left hip   4. Acute pain of left shoulder        Problem List Patient Active Problem List   Diagnosis Date Noted  . Lumbar radiculopathy 09/24/2018  . Foraminal stenosis of lumbar region 09/24/2018  . Spondylosis without myelopathy or radiculopathy, lumbar region 08/29/2016  .  Chronic left-sided low back pain without sciatica 08/29/2016    ASumner Boast, PT 04/29/2019, 10:53 AM  [address] [address]Phone: [phone]  Fax:  [phone] Name: Ricardo MichaelsenMRN: [id number] of Birth: 2Dec 31, 1947

## 2019-04-29 NOTE — Telephone Encounter (Signed)
If the hip injection helped really well but not enough of that I think it is fine looking at MRI of the right hip but would also be wants to have consultation with Dr. [name].

## 2019-04-29 NOTE — Telephone Encounter (Signed)
MRI ordered. Patient will call back when this is scheduled so we can schedule the appropriate follow up.

## 2019-05-01 ENCOUNTER — Ambulatory Visit: Payer: Medicare HMO | Admitting: Physical Therapy

## 2019-05-01 ENCOUNTER — Other Ambulatory Visit: Payer: Self-pay

## 2019-05-01 ENCOUNTER — Encounter: Payer: Self-pay | Admitting: Physical Therapy

## 2019-05-01 DIAGNOSIS — M5442 Lumbago with sciatica, left side: Secondary | ICD-10-CM | POA: Diagnosis not present

## 2019-05-01 DIAGNOSIS — M25551 Pain in right hip: Secondary | ICD-10-CM | POA: Diagnosis not present

## 2019-05-01 DIAGNOSIS — M25552 Pain in left hip: Secondary | ICD-10-CM | POA: Diagnosis not present

## 2019-05-01 DIAGNOSIS — M25512 Pain in left shoulder: Secondary | ICD-10-CM | POA: Diagnosis not present

## 2019-05-01 NOTE — Therapy (Signed)
[place] [address] [place], [place], [address] Phone: [phone]   Fax:  [phone]  Physical Therapy Treatment  Patient Details  Name: Ricardo Stewart MRN: [id number] Date of Birth: 04/20/1946 Referring Provider (PT): Laurence Spates   Encounter Date: 05/01/2019  PT End of Session - 05/01/19 0830    Visit Number  45    Date for PT Re-Evaluation  05/06/19    PT Start Time  0756    PT Stop Time  0846    PT Time Calculation (min)  50 min    Activity Tolerance  Patient tolerated treatment well    Behavior During Therapy  [facility] for tasks assessed/performed       History reviewed. No pertinent past medical history.  History reviewed. No pertinent surgical history.  There were no vitals filed for this visit.  Subjective Assessment - 05/01/19 0758    Subjective  Patient reports that he is waiting for an MRI to be scheduled, reports that he thinks the last treatment did help, he is limping less    Currently in Pain?  Yes    Pain Score  3     Pain Location  Hip    Pain Orientation  Right;Anterior                       [facility] Adult PT Treatment/Exercise - 05/01/19 0001      Moist Heat Therapy   Number Minutes Moist Heat  15 Minutes    Moist Heat Location  Hip      Electrical Stimulation   Electrical Stimulation Location  right anterior hip    Electrical Stimulation Action  IFC    Electrical Stimulation Parameters  supine    Electrical Stimulation Goals  Pain      Manual Therapy   Manual Therapy  Soft tissue mobilization;Passive ROM    Soft tissue mobilization  right hip anterior, adductors and ITB    Passive ROM  right hip and ankle, some popping in the knee               PT Short Term Goals - 10/05/18 0916      PT SHORT TERM GOAL #1   Title  independent with HEP    Status  Achieved        PT Long Term Goals - 05/01/19 3299      PT LONG TERM GOAL #1   Title  report no pain  when getting up in the AM    Status  Partially Met      PT LONG TERM GOAL #2   Title  walk without issue his 3-4 miles    Status  Partially Met            Plan - 05/01/19 0831    Clinical Impression Statement  Patient over the course of our treatment has had shoulder issues, left hip pain, back pain that have all resolved with our treatment, currently we really have been working to get the right hip pain undercontrol, the pain has been posterior, lateral and anterior.  There is a lot of popping in th eknee and he does have some c/o pain in the right ankle.  He is limping less    PT Next Visit Plan  renewal and will see when he can get the MRI    Consulted and Agree with Plan of Care  Patient       Patient  will benefit from skilled therapeutic intervention in order to improve the following deficits and impairments:  Abnormal gait, Decreased mobility, Impaired flexibility, Improper body mechanics, Pain, Increased muscle spasms, Increased fascial restricitons, Difficulty walking, Decreased range of motion  Visit Diagnosis: 1. Pain in right hip        Problem List Patient Active Problem List   Diagnosis Date Noted  . Lumbar radiculopathy 09/24/2018  . Foraminal stenosis of lumbar region 09/24/2018  . Spondylosis without myelopathy or radiculopathy, lumbar region 08/29/2016  . Chronic left-sided low back pain without sciatica 08/29/2016    Sumner Boast., PT 05/01/2019, 8:35 AM  [address] [place], [place], [address] Phone: [phone]   Fax:  [phone]  Name: Ricardo Stewart MRN: [id number] Date of Birth: [birth year]

## 2019-05-07 ENCOUNTER — Other Ambulatory Visit: Payer: Self-pay

## 2019-05-07 ENCOUNTER — Encounter: Payer: Self-pay | Admitting: Physical Therapy

## 2019-05-07 ENCOUNTER — Ambulatory Visit: Payer: Medicare HMO | Admitting: Physical Therapy

## 2019-05-07 DIAGNOSIS — M5442 Lumbago with sciatica, left side: Secondary | ICD-10-CM

## 2019-05-07 DIAGNOSIS — M25551 Pain in right hip: Secondary | ICD-10-CM | POA: Diagnosis not present

## 2019-05-07 DIAGNOSIS — M25552 Pain in left hip: Secondary | ICD-10-CM | POA: Diagnosis not present

## 2019-05-07 DIAGNOSIS — M25512 Pain in left shoulder: Secondary | ICD-10-CM | POA: Diagnosis not present

## 2019-05-07 NOTE — Therapy (Signed)
[place] [address] [place], [place], [address] Phone: [phone]   Fax:  [phone]  Physical Therapy Treatment  Patient Details  Name: Ricardo Stewart MRN: [id number] Date of Birth: [birth year] Referring Provider (PT): Laurence Spates   Encounter Date: 05/07/2019  PT End of Session - 05/07/19 0919    Visit Number  46    Date for PT Re-Evaluation  06/07/19    PT Start Time  0844    PT Stop Time  0930    PT Time Calculation (min)  46 min    Activity Tolerance  Patient tolerated treatment well    Behavior During Therapy  [facility] for tasks assessed/performed       History reviewed. No pertinent past medical history.  History reviewed. No pertinent surgical history.  There were no vitals filed for this visit.  Subjective Assessment - 05/07/19 0917    Subjective  Patient comes in walking better than I have seen him in months, he reports mild pain in the right naterior hip,    Currently in Pain?  Yes    Pain Score  2     Pain Location  Hip    Pain Orientation  Right;Anterior    Pain Relieving Factors  maybe the treatment and the shot helped                       [facility] Adult PT Treatment/Exercise - 05/07/19 0001      Lumbar Exercises: Aerobic   Nustep  level 4 x 5 minutes      Moist Heat Therapy   Number Minutes Moist Heat  15 Minutes    Moist Heat Location  Hip      Electrical Stimulation   Electrical Stimulation Location  right anterior hip    Electrical Stimulation Action  IFC    Electrical Stimulation Parameters  supine    Electrical Stimulation Goals  Pain      Manual Therapy   Manual Therapy  Soft tissue mobilization;Passive ROM    Soft tissue mobilization  right hip anterior, adductors and ITB    Passive ROM  right hip and ankle, some popping in the knee               PT Short Term Goals - 10/05/18 0916      PT SHORT TERM GOAL #1   Title  independent with HEP    Status   Achieved        PT Long Term Goals - 05/01/19 7412      PT LONG TERM GOAL #1   Title  report no pain when getting up in the AM    Status  Partially Met      PT LONG TERM GOAL #2   Title  walk without issue his 3-4 miles    Status  Partially Met            Plan - 05/07/19 0919    Clinical Impression Statement  Patient is doing better today, he is walking the best that I have seen him in months, he still has a right limp.  His HS felt more flexible today, he does have crepitus in the right knee denies pain here, some crepitus in the right hip.    PT Frequency  2x / week    PT Duration  4 weeks    PT Next Visit Plan  is trying to get MRI scheduled  Consulted and Agree with Plan of Care  Patient       Patient will benefit from skilled therapeutic intervention in order to improve the following deficits and impairments:  Abnormal gait, Decreased mobility, Impaired flexibility, Improper body mechanics, Pain, Increased muscle spasms, Increased fascial restricitons, Difficulty walking, Decreased range of motion  Visit Diagnosis: Pain in right hip  Acute bilateral low back pain with left-sided sciatica     Problem List Patient Active Problem List   Diagnosis Date Noted  . Lumbar radiculopathy 09/24/2018  . Foraminal stenosis of lumbar region 09/24/2018  . Spondylosis without myelopathy or radiculopathy, lumbar region 08/29/2016  . Chronic left-sided low back pain without sciatica 08/29/2016    Sumner Boast., PT 05/07/2019, 9:21 AM  [address] [place], [place], [address] Phone: [phone]   Fax:  [phone]  Name: Ricardo Stewart MRN: [id number] Date of Birth: [birth year]

## 2019-05-08 ENCOUNTER — Other Ambulatory Visit (INDEPENDENT_AMBULATORY_CARE_PROVIDER_SITE_OTHER): Payer: Medicare HMO | Admitting: Physical Medicine and Rehabilitation

## 2019-05-08 ENCOUNTER — Telehealth: Payer: Self-pay | Admitting: Physical Medicine and Rehabilitation

## 2019-05-08 MED ORDER — GABAPENTIN 300 MG PO CAPS: ORAL_CAPSULE | ORAL | 0 refills | Status: DC

## 2019-05-08 NOTE — Telephone Encounter (Signed)
Called patient to advise  °

## 2019-05-08 NOTE — Telephone Encounter (Signed)
Done, directions on bottle, sen to pharm on record

## 2019-05-10 ENCOUNTER — Encounter

## 2019-05-13 NOTE — Progress Notes (Signed)
Ricardo Stewart - 73 y.o. male MRN [id number]  Date of birth: October 16, 1945  Office Visit Note: Visit Date: 04/10/2019 PCP: Kathyrn Lass, MD Referred by: Kathyrn Lass, MD  Subjective: Chief Complaint  Patient presents with   Lower Back - Pain   Right Leg - Pain   HPI: Ricardo Stewart is a 73 y.o. male who comes in today By way of brief review we know Mr. Odette quite well.  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  Most recently in March of this year began having more right hip and buttock pain with some groin pain but also with pain down the leg past the knee and a somewhat L5 distribution versus sciatic nerve distribution.  He has had pretty extensive physical therapy and is undergoing that right now.  We have completed most recently an L4 transforaminal injection which gave him no relief.  We actually updated his MRI as well and this is reviewed again below.  Dr.Hilts completed intra-articular hip injection with ultrasound guidance with good temporary relief of the hip and groin pain but the patient states that this did not help the pain down the leg.  We are going today to complete a diagnostic right L5 transforaminal injection.  He has foraminal narrowing here pretty significant.  We will try to make this injection as good as we can do it and see if he gets relief.  If he does not get much relief at all then I think this really is more of a hip issue.  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  I have seen hip referral pain actually referred pain down past the knee and more of a sciatic nerve distribution at times.  ROS Otherwise per HPI.  Assessment & Plan: Visit Diagnoses:  1. Lumbar radiculopathy   2. Foraminal stenosis of lumbar region     Plan: No additional findings.   Meds & Orders:  Meds ordered this encounter  Medications   betamethasone acetate-betamethasone  sodium phosphate (CELESTONE) injection 12 mg    Orders Placed This Encounter  Procedures   XR C-ARM NO REPORT   Epidural Steroid injection    Follow-up: No follow-ups on file.   Procedures: No procedures performed  Lumbosacral Transforaminal Epidural Steroid Injection - Sub-Pedicular Approach with Fluoroscopic Guidance  [name]      Date of Birth: 26-Nov-1945 MRN: [id number] PCP: Kathyrn Lass, MD      Visit Date: 04/10/2019   Universal Protocol:    Date/Time: 04/10/2019  Consent Given By: the patient  Position: PRONE  Additional Comments: Vital signs were monitored before and after the procedure. Patient was prepped and draped in the usual sterile fashion. The correct patient, procedure, and site was verified.   Injection Procedure Details:  Procedure Site One Meds Administered:  Meds ordered this encounter  Medications   betamethasone acetate-betamethasone sodium phosphate (CELESTONE) injection 12 mg    Laterality: Right  Location/Site:  L5-S1  Needle size: 22 G  Needle type: Spinal  Needle Placement: Transforaminal  Findings:    -Comments: Excellent flow of contrast along the nerve and into the epidural space.  Procedure Details: After squaring off the end-plates to get a true AP view, the C-arm was positioned so that an oblique view of the foramen as noted above was visualized. The target area is just inferior to the "nose of the scotty  dog" or sub pedicular. The soft tissues overlying this structure were infiltrated with 2-3 ml. of 1% Lidocaine without Epinephrine.  The spinal needle was inserted toward the target using a "trajectory" view along the fluoroscope beam.  Under AP and lateral visualization, the needle was advanced so it did not puncture dura and was located close the 6 O'Clock position of the pedical in AP tracterory. Biplanar projections were used to confirm position. Aspiration was confirmed to be negative for CSF and/or  blood. A 1-2 ml. volume of Isovue-250 was injected and flow of contrast was noted at each level. Radiographs were obtained for documentation purposes.   After attaining the desired flow of contrast documented above, a 0.5 to 1.0 ml test dose of 0.25% Marcaine was injected into each respective transforaminal space.  The patient was observed for 90 seconds post injection.  After no sensory deficits were reported, and normal lower extremity motor function was noted,   the above injectate was administered so that equal amounts of the injectate were placed at each foramen (level) into the transforaminal epidural space.   Additional Comments:  The patient tolerated the procedure well Dressing: 2 x 2 sterile gauze and Band-Aid    Post-procedure details: Patient was observed during the procedure. Post-procedure instructions were reviewed.  Patient left the clinic in stable condition.    Clinical History: MRI LUMBAR SPINE WITHOUT CONTRAST  TECHNIQUE: Multiplanar, multisequence MR imaging of the lumbar spine was performed. No intravenous contrast was administered.  COMPARISON: 01/20/2011  FINDINGS: Segmentation: Standard.  Alignment: Physiologic.  Vertebrae: No fracture, evidence of discitis, or bone lesion.  Conus medullaris and cauda equina: Conus extends to the L1 level. Conus and cauda equina appear normal.  Paraspinal and other soft tissues: No acute paraspinal abnormality.  Disc levels:  Disc spaces: Severe degenerative disc disease with disc height loss at L4-5. Mild degenerative disc disease with mild disc height loss at T11-12 and T12-L1. Disc desiccation at L5-S1 and L3-4.  T12-L1: Small right paracentral disc protrusion. No evidence of neural foraminal stenosis. No central canal stenosis.  L1-L2: No significant disc bulge. No evidence of neural foraminal stenosis. No central canal stenosis.  L2-L3: No significant disc bulge. No evidence of neural  foraminal stenosis. No central canal stenosis.  L3-L4: Broad-based disc bulge. Mild bilateral facet arthropathy. Mild spinal stenosis. No evidence of neural foraminal stenosis.  L4-L5: Broad-based disc bulge with a right lateral disc osteophyte complex. Moderate bilateral facet arthropathy. Moderate spinal stenosis and bilateral lateral recess stenosis. No left foraminal stenosis. Moderate right foraminal stenosis.  L5-S1: Broad-based disc bulge eccentric towards the left. Severe bilateral facet arthropathy with ligamentum flavum infolding. Left lateral recess stenosis. Mild spinal stenosis. Severe left foraminal stenosis. Mild right foraminal stenosis. 3 mm left facet posterior extra-spinal synovial cyst.  IMPRESSION: 1. At L4-5 there is a broad-based disc bulge with a right lateral disc osteophyte complex. Moderate bilateral facet arthropathy. Moderate spinal stenosis and bilateral lateral recess stenosis. No left foraminal stenosis. Moderate right foraminal stenosis. 2. At L5-S1 there is a broad-based disc bulge eccentric towards the left. Severe bilateral facet arthropathy with ligamentum flavum infolding. Left lateral recess stenosis. Mild spinal stenosis. Severe left foraminal stenosis. Mild right foraminal stenosis. 3 mm left facet posterior extra-spinal synovial cyst. 3. Interval resolution of large intraspinal synovial cyst at L5-S1 and a smaller intraspinal synovial cyst at L4-5.   Electronically Signed By: Elige KoHetal Patel On: 02/03/2018 18:33   Lumbar Spine 01/21/2011  MRI LUMBAR SPINE WITHOUT CONTRAST l  IMPRESSION:   1. Spondylosis and degenerative disc disease with dominant findings at the L4-5 and L5-S1 levels. 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 2. 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 If procedural  intervention is to be performed, careful correlation with this numbering strategy is recommended.   He reports that he has never smoked. He has never used smokeless tobacco. No results for input(s): HGBA1C, LABURIC in the last 8760 hours.  Objective:  VS:  HT:     WT:    BMI:      BP:134/76   HR:62bpm   TEMP: ( )   RESP:  Physical Exam Musculoskeletal:     Comments: Patient does ambulate with an antalgic gait to the right.  He has painful range of motion of the hip on the right.  No pain over the greater trochanters bilaterally good distal strength bilaterally without clonus.  Neurological:     General: No focal deficit present.     Mental Status: He is oriented to person, place, and time.     Sensory: No sensory deficit.     Gait: Gait abnormal.  Psychiatric:        Mood and Affect: Mood normal.        Behavior: Behavior normal.        Thought Content: Thought content normal.     Ortho Exam Imaging: No results found.  Past Medical/Family/Surgical/Social History: Medications & Allergies reviewed per EMR, new medications updated. Patient Active Problem List   Diagnosis Date Noted   Lumbar radiculopathy 09/24/2018   Foraminal stenosis of lumbar region 09/24/2018   Spondylosis without myelopathy or radiculopathy, lumbar region 08/29/2016   Chronic left-sided low back pain without sciatica 08/29/2016   No past medical history on file. No family history on file. No past surgical history on file. Social History   Occupational History   Not on file  Tobacco Use   Smoking status: Never Smoker   Smokeless tobacco: Never Used  Substance and Sexual Activity   Alcohol use: Not on file   Drug use: Not on file   Sexual activity: Not on file

## 2019-05-13 NOTE — Procedures (Signed)
Lumbosacral Transforaminal Epidural Steroid Injection - Sub-Pedicular Approach with Fluoroscopic Guidance  [name]      Date of Birth: 1946-03-06 MRN: [id number] PCP: Kathyrn Lass, MD      Visit Date: 04/10/2019   Universal Protocol:    Date/Time: 04/10/2019  Consent Given By: the patient  Position: PRONE  Additional Comments: Vital signs were monitored before and after the procedure. Patient was prepped and draped in the usual sterile fashion. The correct patient, procedure, and site was verified.   Injection Procedure Details:  Procedure Site One Meds Administered:  Meds ordered this encounter  Medications  . betamethasone acetate-betamethasone sodium phosphate (CELESTONE) injection 12 mg    Laterality: Right  Location/Site:  L5-S1  Needle size: 22 G  Needle type: Spinal  Needle Placement: Transforaminal  Findings:    -Comments: Excellent flow of contrast along the nerve and into the epidural space.  Procedure Details: After squaring off the end-plates to get a true AP view, the C-arm was positioned so that an oblique view of the foramen as noted above was visualized. The target area is just inferior to the "nose of the scotty dog" or sub pedicular. The soft tissues overlying this structure were infiltrated with 2-3 ml. of 1% Lidocaine without Epinephrine.  The spinal needle was inserted toward the target using a "trajectory" view along the fluoroscope beam.  Under AP and lateral visualization, the needle was advanced so it did not puncture dura and was located close the 6 O'Clock position of the pedical in AP tracterory. Biplanar projections were used to confirm position. Aspiration was confirmed to be negative for CSF and/or blood. A 1-2 ml. volume of Isovue-250 was injected and flow of contrast was noted at each level. Radiographs were obtained for documentation purposes.   After attaining the desired flow of contrast documented above, a 0.5 to 1.0 ml  test dose of 0.25% Marcaine was injected into each respective transforaminal space.  The patient was observed for 90 seconds post injection.  After no sensory deficits were reported, and normal lower extremity motor function was noted,   the above injectate was administered so that equal amounts of the injectate were placed at each foramen (level) into the transforaminal epidural space.   Additional Comments:  The patient tolerated the procedure well Dressing: 2 x 2 sterile gauze and Band-Aid    Post-procedure details: Patient was observed during the procedure. Post-procedure instructions were reviewed.  Patient left the clinic in stable condition.

## 2019-05-14 ENCOUNTER — Other Ambulatory Visit: Payer: Self-pay

## 2019-05-14 ENCOUNTER — Encounter: Payer: Self-pay | Admitting: Physical Therapy

## 2019-05-14 ENCOUNTER — Ambulatory Visit: Payer: Medicare HMO | Attending: Family Medicine | Admitting: Physical Therapy

## 2019-05-14 DIAGNOSIS — M5442 Lumbago with sciatica, left side: Secondary | ICD-10-CM | POA: Diagnosis not present

## 2019-05-14 DIAGNOSIS — M25552 Pain in left hip: Secondary | ICD-10-CM | POA: Diagnosis not present

## 2019-05-14 DIAGNOSIS — M25551 Pain in right hip: Secondary | ICD-10-CM | POA: Insufficient documentation

## 2019-05-14 NOTE — Therapy (Signed)
[facility] [address] [place], [place], [address] Phone: [phone]   Fax:  [phone]  Physical Therapy Treatment  Patient Details  Name: Ricardo Stewart MRN: [id number] Date of Birth: 17-Apr-1946 Referring Provider (PT): Laurence Spates   Encounter Date: 05/14/2019  PT End of Session - 05/14/19 1009    Visit Number  64    Date for PT Re-Evaluation  06/07/19    Authorization Type  [employer]    PT Start Time  0844    PT Stop Time  0931    PT Time Calculation (min)  47 min    Activity Tolerance  Patient tolerated treatment well    Behavior During Therapy  [facility] for tasks assessed/performed       History reviewed. No pertinent past medical history.  History reviewed. No pertinent surgical history.  There were no vitals filed for this visit.  Subjective Assessment - 05/14/19 0916    Subjective  Patient again is walking better, reports that the pain has changed a little more to pain in the right buttock.    Currently in Pain?  Yes    Pain Score  3     Pain Location  Hip    Pain Orientation  Right;Posterior    Aggravating Factors   walking                       [facility] Adult PT Treatment/Exercise - 05/14/19 0001      Moist Heat Therapy   Number Minutes Moist Heat  15 Minutes    Moist Heat Location  Hip      Electrical Stimulation   Electrical Stimulation Location  right posterior lateral hip    Electrical Stimulation Action  IFC    Electrical Stimulation Parameters  supine    Electrical Stimulation Goals  Pain      Ultrasound   Ultrasound Location  right posterior and lateral hip    Ultrasound Parameters  100% 1.5w/cm2    Ultrasound Goals  Pain      Manual Therapy   Manual Therapy  Soft tissue mobilization;Passive ROM    Soft tissue mobilization  right hip anterior, adductors and ITB    Passive ROM  right hip and ankle, some popping in the knee               PT Short Term Goals -  10/05/18 0916      PT SHORT TERM GOAL #1   Title  independent with HEP    Status  Achieved        PT Long Term Goals - 05/14/19 1018      PT LONG TERM GOAL #1   Title  report no pain when getting up in the AM    Status  Partially Met            Plan - 05/14/19 1017    Clinical Impression Statement  Patient again seems to be doing a little better, we have decreaesd our frequency to 1x/week until he gets the MRI which is scheuled for 3 weeks from today, pain has moved back toward the piriformis area again.    PT Next Visit Plan  see 1x/week until MRI    Consulted and Agree with Plan of Care  Patient       Patient will benefit from skilled therapeutic intervention in order to improve the following deficits and impairments:     Visit Diagnosis: Pain in  right hip  Acute bilateral low back pain with left-sided sciatica  Pain in left hip     Problem List Patient Active Problem List   Diagnosis Date Noted  . Lumbar radiculopathy 09/24/2018  . Foraminal stenosis of lumbar region 09/24/2018  . Spondylosis without myelopathy or radiculopathy, lumbar region 08/29/2016  . Chronic left-sided low back pain without sciatica 08/29/2016    Sumner Boast., PT 05/14/2019, 10:19 AM  [address] [place], [place], [address] Phone: [phone]   Fax:  [phone]  Name: Ricardo Stewart MRN: [id number] Date of Birth: February 23, 1946

## 2019-05-22 ENCOUNTER — Other Ambulatory Visit: Payer: Self-pay

## 2019-05-22 ENCOUNTER — Ambulatory Visit: Payer: Medicare HMO | Admitting: Physical Therapy

## 2019-05-22 ENCOUNTER — Encounter: Payer: Self-pay | Admitting: Physical Therapy

## 2019-05-22 DIAGNOSIS — M25552 Pain in left hip: Secondary | ICD-10-CM | POA: Diagnosis not present

## 2019-05-22 DIAGNOSIS — M5442 Lumbago with sciatica, left side: Secondary | ICD-10-CM

## 2019-05-22 DIAGNOSIS — M25551 Pain in right hip: Secondary | ICD-10-CM

## 2019-05-22 NOTE — Therapy (Signed)
[employer] [place], [place], [address] Phone: [phone]   Fax:  [phone]  Physical Therapy Treatment  Patient Details  Name: Ricardo Stewart MRN: [id number] Date of Birth: 16-Jul-1946 Referring Provider (PT): Laurence Spates   Encounter Date: 05/22/2019  PT End of Session - 05/22/19 0916    Visit Number  87    Date for PT Re-Evaluation  06/07/19    Authorization Type  [employer]    PT Start Time  0843    PT Stop Time  0931    PT Time Calculation (min)  48 min    Activity Tolerance  Patient tolerated treatment well    Behavior During Therapy  [facility] for tasks assessed/performed       History reviewed. No pertinent past medical history.  History reviewed. No pertinent surgical history.  There were no vitals filed for this visit.  Subjective Assessment - 05/22/19 0913    Subjective  Patient comes in today with noticeably swollen ankles.  He reports started recently, he reports a cahnge in dosage of Gabapentin, but unsure of any other changes,    Currently in Pain?  Yes    Pain Score  4     Pain Location  Hip    Pain Orientation  Right;Anterior    Aggravating Factors   pain continues to move around                       [facility] Adult PT Treatment/Exercise - 05/22/19 0001      Ambulation/Gait   Gait Comments  continues with a limp on the right,m worse today than last week      Lumbar Exercises: Stretches   Passive Hamstring Stretch  4 reps;20 seconds    Hip Flexor Stretch  5 reps;20 seconds    ITB Stretch  4 reps;20 seconds    Piriformis Stretch  4 reps;20 seconds      Lumbar Exercises: Aerobic   Recumbent Bike  4 minutes      Lumbar Exercises: Supine   Glut Set  20 reps;1 second    Bridge with [employer]  20 reps;1 second      Moist Heat Therapy   Number Minutes Moist Heat  15 Minutes    Moist Heat Location  Hip      Electrical Stimulation   Electrical Stimulation Location   right anterior hip    Electrical Stimulation Action  IFC    Electrical Stimulation Parameters  supine    Electrical Stimulation Goals  Pain      Manual Therapy   Manual Therapy  Soft tissue mobilization;Passive ROM    Soft tissue mobilization  right hip anterior, adductors and ITB    Passive ROM  right hip and ankle, some popping in the knee               PT Short Term Goals - 10/05/18 0916      PT SHORT TERM GOAL #1   Title  independent with HEP    Status  Achieved        PT Long Term Goals - 05/22/19 4854      PT LONG TERM GOAL #1   Title  report no pain when getting up in the AM    Status  Partially Met      PT LONG TERM GOAL #3   Title  increase SLR to 60 degrees    Status  Achieved  Plan - 05/22/19 0916    Clinical Impression Statement  The pain continues to move around, has been posterior, lateral and today is more anterior, has tenderness at the ASIS, he is tight in the hip flexor and the quad.  Tolerated the exercises today without an increase of pain, he does have pitting edema in the lower legs and the ankles, I asked him to call his MD, he has an MRI scheduled for 2 weeks form now    PT Next Visit Plan  see 1x/week until MRI    Consulted and Agree with Plan of Care  Patient       Patient will benefit from skilled therapeutic intervention in order to improve the following deficits and impairments:  Abnormal gait, Decreased mobility, Impaired flexibility, Improper body mechanics, Pain, Increased muscle spasms, Increased fascial restricitons, Difficulty walking, Decreased range of motion  Visit Diagnosis: Pain in right hip  Acute bilateral low back pain with left-sided sciatica     Problem List Patient Active Problem List   Diagnosis Date Noted  . Lumbar radiculopathy 09/24/2018  . Foraminal stenosis of lumbar region 09/24/2018  . Spondylosis without myelopathy or radiculopathy, lumbar region 08/29/2016  . Chronic left-sided low  back pain without sciatica 08/29/2016    Sumner Boast., PT 05/22/2019, 9:18 AM  [address] [place], [place], [address] Phone: [phone]   Fax:  [phone]  Name: Ricardo Stewart MRN: [id number] Date of Birth: 1946/08/24

## 2019-05-23 ENCOUNTER — Telehealth: Payer: Self-pay | Admitting: Physical Medicine and Rehabilitation

## 2019-05-23 NOTE — Telephone Encounter (Signed)
Please advise. I think Dr. Junius Roads is out of the office this afternoon and tomorrow.

## 2019-05-23 NOTE — Telephone Encounter (Signed)
I would see if any new medications started, if not then elevate legs by lying down and get legs propped up. Can use ace wrap or compression stocking if available. If still there tomorrow would have him call back to Dr. Junius Roads or we can see if he will see him tomorrow. If both ankles and feet doing this then likely not blood clot - if concerned can go to urgent care.  If any swelling anywhere else such as lips or if rash then benadryl.

## 2019-05-23 NOTE — Telephone Encounter (Signed)
Yes I would stop them, I have seen more welling with Lyrica but can happen with gabapentin but usually a higher dose.  Still would stop for now. It does not effect the heart function or anything like that - the swelling is usually an osmotic effect.

## 2019-05-23 NOTE — Telephone Encounter (Signed)
Patient states that the only medication he has recently started is gabapentin. He states that he was up to 2 per day, but he stopped taking this about two days ago when he noticed the swelling.

## 2019-05-23 NOTE — Telephone Encounter (Signed)
Patient called. Says his feet and ankles are swollen. He would like to know what he should do. His call back number is [phone]. Thanks

## 2019-05-24 NOTE — Telephone Encounter (Signed)
Called patient to advise. He states that the swelling is getting better with elevation and with stopping the Gabapentin.

## 2019-05-25 IMAGING — MR MR LUMBAR SPINE W/O CM
6 series · 35 of 48 positions shown · non-contrast
Comparison: 01/20/2011

CLINICAL DATA: Chronic low back pain for 5 years. Left posterior
thigh pain for 1 year.

EXAM:
MRI LUMBAR SPINE WITHOUT CONTRAST
TECHNIQUE: Multiplanar, multisequence MR imaging of the lumbar spine was
performed. No intravenous contrast was administered.

[Series 3: T1 · sagittal · 4.0mm · 1.02mm/px · 6 of 15 slices shown (1 of 2)]
[im 1/15]
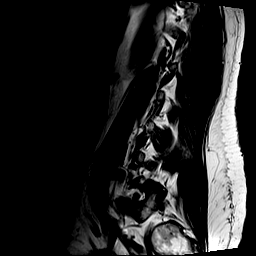
[im 3/15]
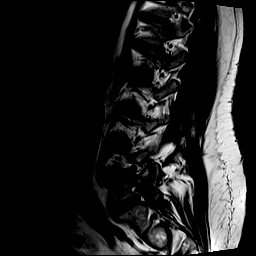
[im 6/15]
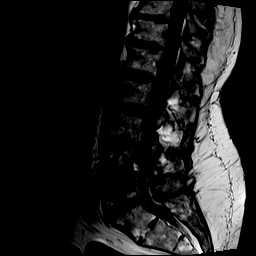
[im 9/15]
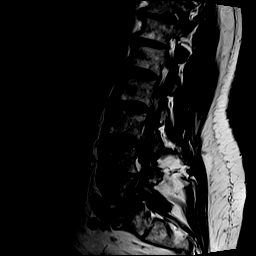
[im 12/15]
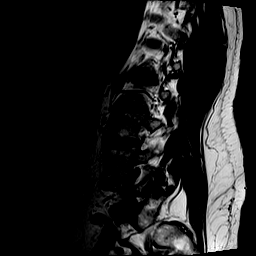
[im 15/15]
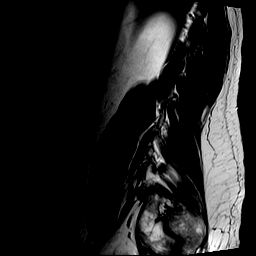

[Series 4: T2 · sagittal · 4.0mm · 1.02mm/px · 5 of 15 slices shown (1 of 2)]
[im 1/15]
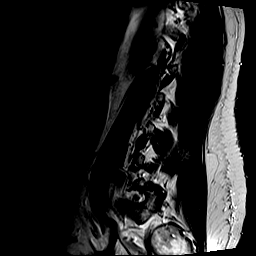
[im 4/15]
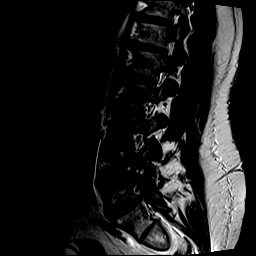
[im 8/15]
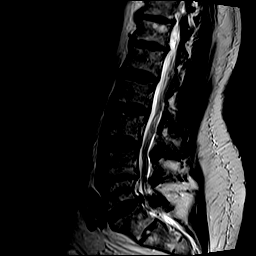
[im 11/15]
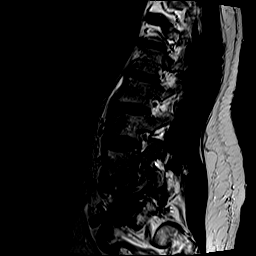
[im 15/15]
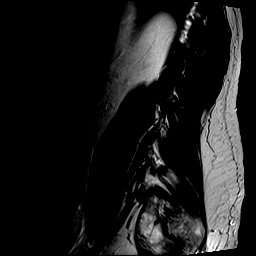

[Series 5: STIR · sagittal · 4.0mm · 1.02mm/px · 5 of 15 slices shown]
[im 1/15]
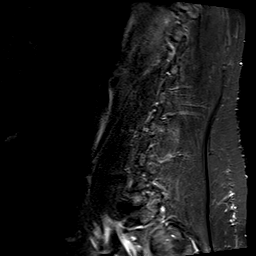
[im 4/15]
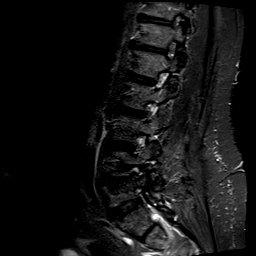
[im 8/15]
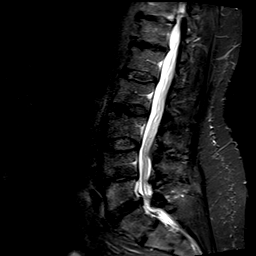
[im 11/15]
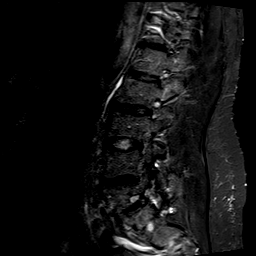
[im 15/15]
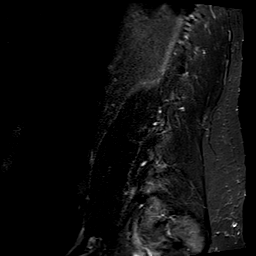

[Series 6: T2 · axial · 4.0mm · 0.39mm/px · z∈[-3,+220]mm · 9 of 40 slices shown (2 of 2)]
[im 1/40]
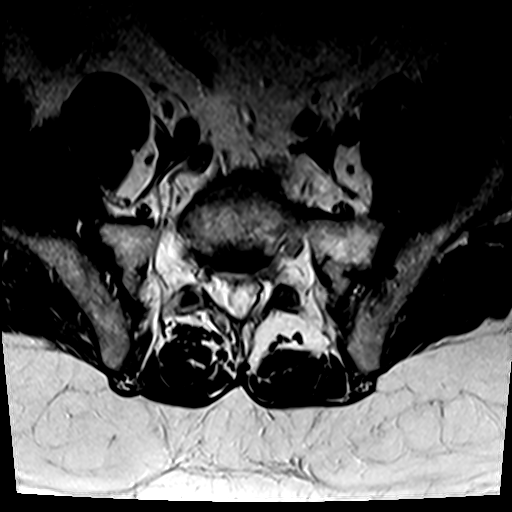
[im 7/40]
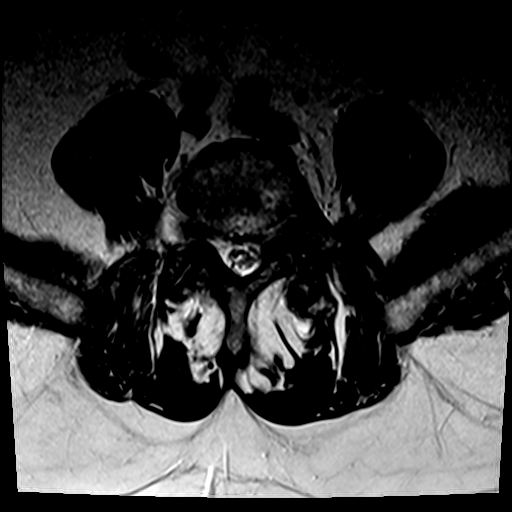
[im 14/40]
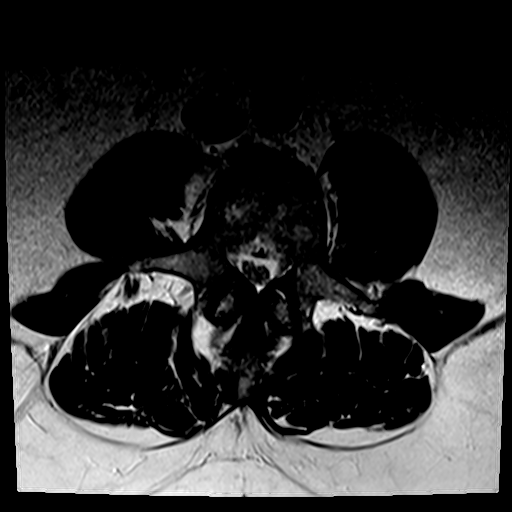
[im 17/40]
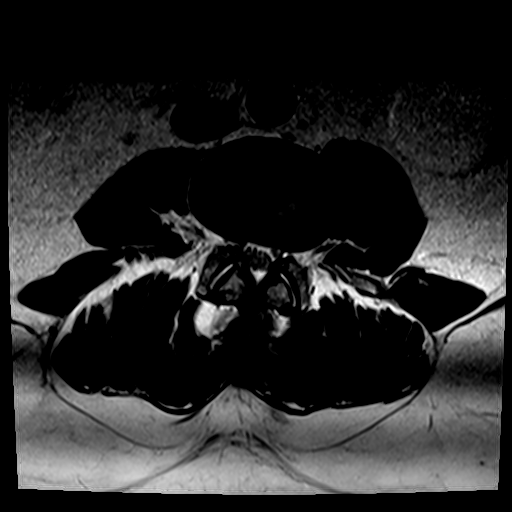
[im 20/40]
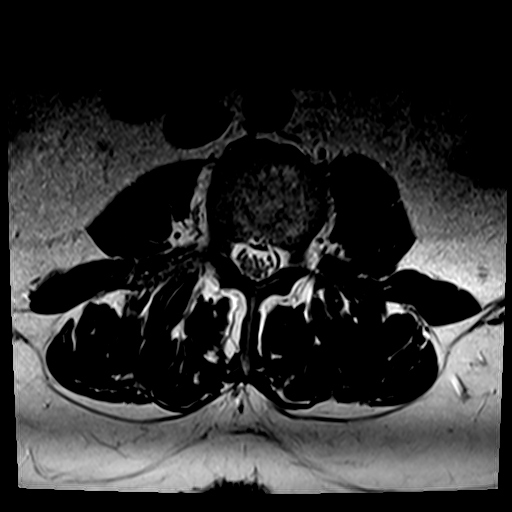
[im 23/40]
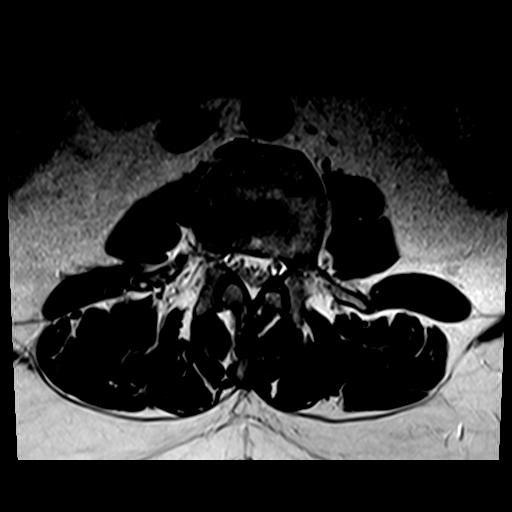
[im 27/40]
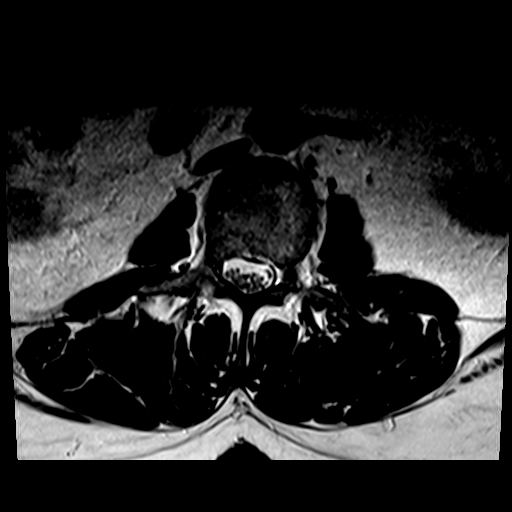
[im 33/40]
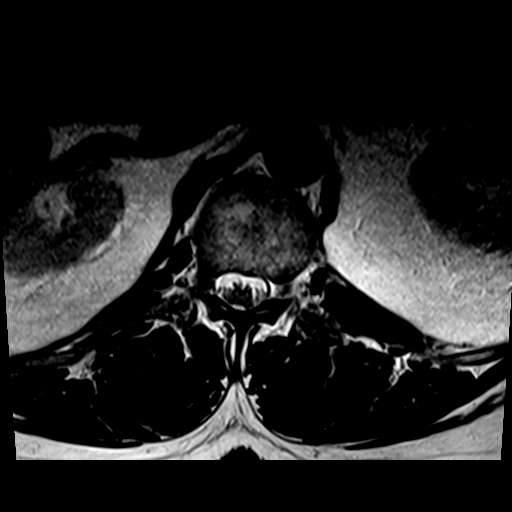
[im 40/40]
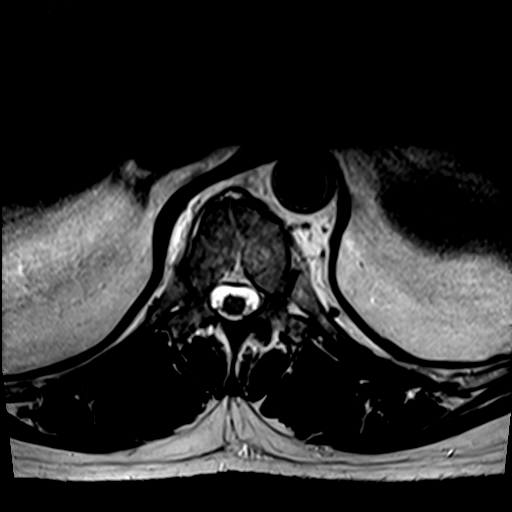

[Series 7: T1 · axial · 4.0mm · 0.78mm/px · z∈[-3,+220]mm · 9 of 40 slices shown (2 of 2)]
[im 1/40]
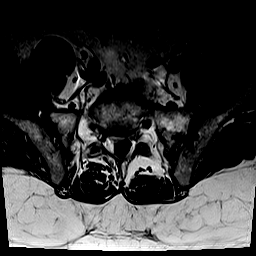
[im 7/40]
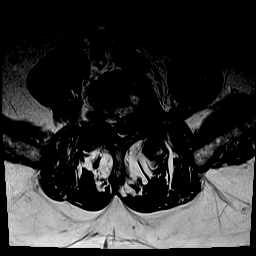
[im 14/40]
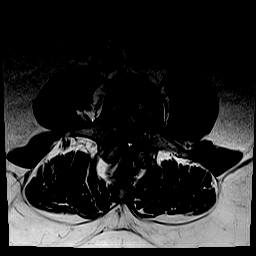
[im 17/40]
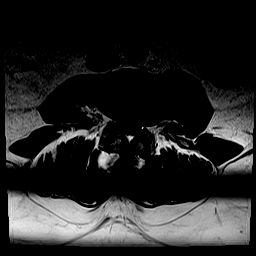
[im 20/40]
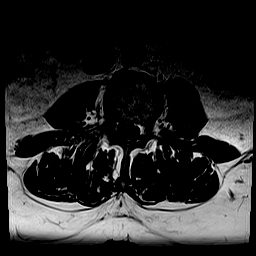
[im 23/40]
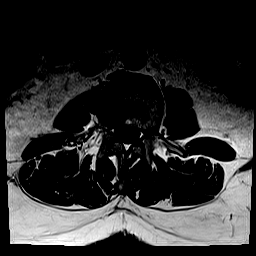
[im 27/40]
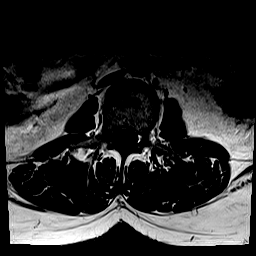
[im 33/40]
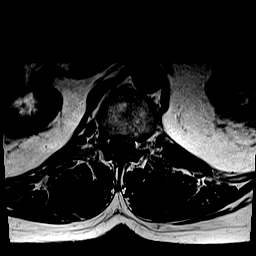
[im 40/40]
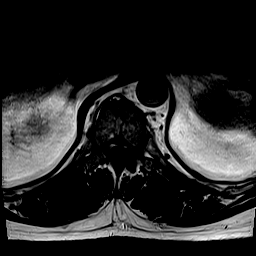

[Series 100: hx · axial · 10.0mm · 0.62mm/px · 1 of 18 slices shown]
[im 1/18]
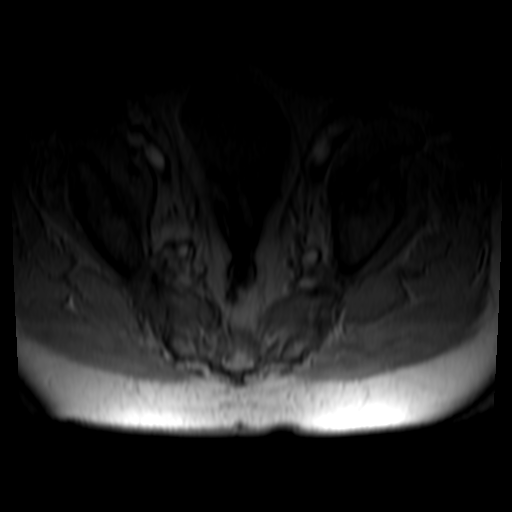

[35 of 48 positions shown; findings below may reference images not displayed]

FINDINGS: Segmentation:  Standard.

Alignment:  Physiologic.

Vertebrae:  No fracture, evidence of discitis, or bone lesion.

Conus medullaris and cauda equina: Conus extends to the L1 level.
Conus and cauda equina appear normal.

Paraspinal and other soft tissues: No acute paraspinal abnormality.

Disc levels:

Disc spaces: Severe degenerative disc disease with disc height loss
at L4-5. Mild degenerative disc disease with mild disc height loss
at T11-12 and T12-L1. Disc desiccation at L5-S1 and L3-4.

T12-L1: Small right paracentral disc protrusion. No evidence of
neural foraminal stenosis. No central canal stenosis.

L1-L2: No significant disc bulge. No evidence of neural foraminal
stenosis. No central canal stenosis.

L2-L3: No significant disc bulge. No evidence of neural foraminal
stenosis. No central canal stenosis.

L3-L4: Broad-based disc bulge. Mild bilateral facet arthropathy.
Mild spinal stenosis. No evidence of neural foraminal stenosis.

L4-L5: Broad-based disc bulge with a right lateral disc osteophyte
complex. Moderate bilateral facet arthropathy. Moderate spinal
stenosis and bilateral lateral recess stenosis. No left foraminal
stenosis. Moderate right foraminal stenosis.

L5-S1: Broad-based disc bulge eccentric towards the left. Severe
bilateral facet arthropathy with ligamentum flavum infolding. Left
lateral recess stenosis. Mild spinal stenosis. Severe left foraminal
stenosis. Mild right foraminal stenosis. 3 mm left facet posterior
extra-spinal synovial cyst.
IMPRESSION: 1. At L4-5 there is a broad-based disc bulge with a right lateral
disc osteophyte complex. Moderate bilateral facet arthropathy.
Moderate spinal stenosis and bilateral lateral recess stenosis. No
left foraminal stenosis. Moderate right foraminal stenosis.
2. At L5-S1 there is a broad-based disc bulge eccentric towards the
left. Severe bilateral facet arthropathy with ligamentum flavum
infolding. Left lateral recess stenosis. Mild spinal stenosis.
Severe left foraminal stenosis. Mild right foraminal stenosis. 3 mm
left facet posterior extra-spinal synovial cyst.
3. Interval resolution of large intraspinal synovial cyst at L5-S1
and a smaller intraspinal synovial cyst at L4-5.

## 2019-05-28 ENCOUNTER — Encounter: Payer: Self-pay | Admitting: Physical Therapy

## 2019-05-28 ENCOUNTER — Ambulatory Visit: Payer: Medicare HMO | Admitting: Physical Therapy

## 2019-05-28 ENCOUNTER — Other Ambulatory Visit: Payer: Self-pay

## 2019-05-28 DIAGNOSIS — M5442 Lumbago with sciatica, left side: Secondary | ICD-10-CM

## 2019-05-28 DIAGNOSIS — M25551 Pain in right hip: Secondary | ICD-10-CM

## 2019-05-28 DIAGNOSIS — M25552 Pain in left hip: Secondary | ICD-10-CM | POA: Diagnosis not present

## 2019-05-28 NOTE — Therapy (Signed)
[place] [place] [address] [place], [place], [address] Phone: [phone]   Fax:  [phone]  Physical Therapy Treatment  Patient Details  Name: Ricardo Stewart MRN: [id number] Date of Birth: 1945/10/17 Referring Provider (PT): Laurence Spates   Encounter Date: 05/28/2019  PT End of Session - 05/28/19 0835    Visit Number  52    Date for PT Re-Evaluation  06/07/19    PT Start Time  1638    PT Stop Time  0849    PT Time Calculation (min)  52 min       History reviewed. No pertinent past medical history.  History reviewed. No pertinent surgical history.  There were no vitals filed for this visit.  Subjective Assessment - 05/28/19 0801    Subjective  Patient reports that has a little less swelling, trying to stay off feet some    Currently in Pain?  Yes    Pain Score  3     Pain Location  Hip    Pain Orientation  Right    Aggravating Factors   walking                       [facility] Adult PT Treatment/Exercise - 05/28/19 0001      Lumbar Exercises: Stretches   Passive Hamstring Stretch  4 reps;20 seconds    Hip Flexor Stretch  5 reps;20 seconds    ITB Stretch  4 reps;20 seconds    Piriformis Stretch  4 reps;20 seconds      Lumbar Exercises: Aerobic   Recumbent Bike  4 minutes      Lumbar Exercises: Supine   Glut Set  20 reps;1 second    Bridge with [employer]  20 reps;1 second    Other Supine Lumbar Exercises  feet on ball K2C, trunk rotation , small bridge and isometric abdominals      Moist Heat Therapy   Number Minutes Moist Heat  15 Minutes    Moist Heat Location  Hip      Electrical Stimulation   Electrical Stimulation Location  right anterior hip    Electrical Stimulation Action  IFC    Electrical Stimulation Parameters  supine    Electrical Stimulation Goals  Pain      Manual Therapy   Manual Therapy  Soft tissue mobilization;Passive ROM    Soft tissue mobilization  right hip  anterior, adductors and ITB    Passive ROM  right hip and ankle, some popping in the knee               PT Short Term Goals - 10/05/18 0916      PT SHORT TERM GOAL #1   Title  independent with HEP    Status  Achieved        PT Long Term Goals - 05/28/19 4665      PT LONG TERM GOAL #1   Title  report no pain when getting up in the AM    Status  Partially Met      PT LONG TERM GOAL #2   Title  walk without issue his 3-4 miles    Status  On-going            Plan - 05/28/19 9935    Clinical Impression Statement  Patient with less swelling today but still increased with pitting edema in the lower legs, he has a lot of popping in the right knee.  He is tolerating some of the exercises, I am not adding much as he will see [place] next Tuesday and then have an MRI on Wednesday    PT Next Visit Plan  see 1x/week until MRI    Consulted and Agree with Plan of Care  Patient       Patient will benefit from skilled therapeutic intervention in order to improve the following deficits and impairments:  Abnormal gait, Decreased mobility, Impaired flexibility, Improper body mechanics, Pain, Increased muscle spasms, Increased fascial restricitons, Difficulty walking, Decreased range of motion  Visit Diagnosis: Pain in right hip  Acute bilateral low back pain with left-sided sciatica     Problem List Patient Active Problem List   Diagnosis Date Noted  . Lumbar radiculopathy 09/24/2018  . Foraminal stenosis of lumbar region 09/24/2018  . Spondylosis without myelopathy or radiculopathy, lumbar region 08/29/2016  . Chronic left-sided low back pain without sciatica 08/29/2016    Sumner Boast., PT 05/28/2019, 8:38 AM  [address] [place], [place], [address] Phone: [phone]   Fax:  [phone]  Name: Ricardo Stewart MRN: [id number] Date of Birth: February 27, 1946

## 2019-06-04 ENCOUNTER — Other Ambulatory Visit: Payer: Self-pay

## 2019-06-04 ENCOUNTER — Encounter: Payer: Self-pay | Admitting: Physical Therapy

## 2019-06-04 ENCOUNTER — Ambulatory Visit: Payer: Medicare HMO | Admitting: Physical Therapy

## 2019-06-04 DIAGNOSIS — M25552 Pain in left hip: Secondary | ICD-10-CM | POA: Diagnosis not present

## 2019-06-04 DIAGNOSIS — M5442 Lumbago with sciatica, left side: Secondary | ICD-10-CM | POA: Diagnosis not present

## 2019-06-04 DIAGNOSIS — M25551 Pain in right hip: Secondary | ICD-10-CM

## 2019-06-04 NOTE — Therapy (Signed)
[place], [place], [address] Phone: [phone]   Fax:  [phone] Progress Note Reporting Period 04/09/19 to 06/04/19 for visits 41-50  See note below for Objective Data and Assessment of Progress/Goals.      Physical Therapy Treatment  Patient Details  Name: Ricardo Stewart MRN: [id number] Date of Birth: [birth year] Referring Provider (PT): Laurence Spates   Encounter Date: 06/04/2019  PT End of Session - 06/04/19 0920    Visit Number  50    Date for PT Re-Evaluation  06/07/19    Authorization Type  [employer]    PT Start Time  0844    PT Stop Time  0935    PT Time Calculation (min)  51 min    Activity Tolerance  Patient tolerated treatment well;Patient limited by pain    Behavior During Therapy  [facility] for tasks assessed/performed       History reviewed. No pertinent past medical history.  History reviewed. No pertinent surgical history.  There were no vitals filed for this visit.  Subjective Assessment - 06/04/19 0852    Subjective  Patient is having an MRI tomorrow, he is frustrated that the pain is not going away, he is also worried about the results.  He reports he tried to walk some over the weekend and was able to go .75 miles much more than he has been able to do but much less than his normal 3-4 miles a day    Currently in Pain?  Yes    Pain Score  5     Pain Location  Hip    Pain Orientation  Right;Anterior;Posterior    Pain Descriptors / Indicators  Sore;Aching;Tightness    Aggravating Factors   walking                       [facility] Adult PT Treatment/Exercise - 06/04/19 0001      Lumbar Exercises: Aerobic   Recumbent Bike  5 minutes      Lumbar Exercises: Supine   Bridge with Ball Squeeze  20 reps;1 second    Other Supine Lumbar Exercises  feet on ball K2C, trunk rotation , small bridge and isometric abdominals      Moist Heat Therapy   Number Minutes Moist Heat   15 Minutes    Moist Heat Location  Hip      Electrical Stimulation   Electrical Stimulation Location  right anterior hip    Electrical Stimulation Action  IFC    Electrical Stimulation Parameters  supine    Electrical Stimulation Goals  Pain      Manual Therapy   Manual Therapy  Soft tissue mobilization;Passive ROM    Soft tissue mobilization  right hip anterior, adductors and ITB    Passive ROM  right hip all motions               PT Short Term Goals - 10/05/18 0916      PT SHORT TERM GOAL #1   Title  independent with HEP    Status  Achieved        PT Long Term Goals - 06/04/19 7121      PT LONG TERM GOAL #1   Title  report no pain when getting up in the AM    Status  Partially Met      PT LONG TERM GOAL #2   Title  walk without issue his 3-4 miles  Status  On-going            Plan - 06/04/19 0920    Clinical Impression Statement  Patient still with significant pain with any weightbearing activity.  He will be having MRI tomorrow, he has a lot of popping in the right knee but has no c/o pain in the knee, he is having significant limping on the right but does not c/o high rating of pain.    PT Next Visit Plan  hopefully he will have MRI and find out what the issue is and he and or we can adjust accordingly    Consulted and Agree with Plan of Care  Patient       Patient will benefit from skilled therapeutic intervention in order to improve the following deficits and impairments:  Abnormal gait, Decreased mobility, Impaired flexibility, Improper body mechanics, Pain, Increased muscle spasms, Increased fascial restricitons, Difficulty walking, Decreased range of motion  Visit Diagnosis: Pain in right hip  Acute bilateral low back pain with left-sided sciatica     Problem List Patient Active Problem List   Diagnosis Date Noted  . Lumbar radiculopathy 09/24/2018  . Foraminal stenosis of lumbar region 09/24/2018  . Spondylosis without myelopathy or  radiculopathy, lumbar region 08/29/2016  . Chronic left-sided low back pain without sciatica 08/29/2016    Sumner Boast., PT 06/04/2019, 9:23 AM  [address] [place], [place], [address] Phone: [phone]   Fax:  [phone]  Name: Ricardo Stewart MRN: [id number] Date of Birth: 05-Dec-1945

## 2019-06-05 ENCOUNTER — Telehealth: Payer: Self-pay

## 2019-06-05 ENCOUNTER — Ambulatory Visit
Admission: RE | Admit: 2019-06-05 | Discharge: 2019-06-05 | Disposition: A | Discharge status: Home / Self Care | Payer: Medicare HMO | Source: Ambulatory Visit | Attending: Physical Medicine and Rehabilitation | Admitting: Physical Medicine and Rehabilitation

## 2019-06-05 DIAGNOSIS — S73111A Iliofemoral ligament sprain of right hip, initial encounter: Secondary | ICD-10-CM | POA: Diagnosis not present

## 2019-06-05 DIAGNOSIS — M25551 Pain in right hip: Secondary | ICD-10-CM

## 2019-06-05 DIAGNOSIS — S76311A Strain of muscle, fascia and tendon of the posterior muscle group at thigh level, right thigh, initial encounter: Secondary | ICD-10-CM | POA: Diagnosis not present

## 2019-06-05 NOTE — Telephone Encounter (Signed)
[facility] called and lm on vm for a call report right hip . Report available . Please see report this was ordered by Dr. Ernestina Patches

## 2019-06-06 NOTE — Telephone Encounter (Signed)
I called the patient to advise. He wanted to start with a follow up with Dr. Junius Roads first. He is scheduled for 9/28 at 1620.

## 2019-06-06 NOTE — Telephone Encounter (Signed)
Please advise 

## 2019-06-06 NOTE — Telephone Encounter (Signed)
See note I just sent with imaging report

## 2019-06-10 ENCOUNTER — Ambulatory Visit (INDEPENDENT_AMBULATORY_CARE_PROVIDER_SITE_OTHER): Payer: Medicare HMO | Admitting: Family Medicine

## 2019-06-10 DIAGNOSIS — M25551 Pain in right hip: Secondary | ICD-10-CM | POA: Diagnosis not present

## 2019-06-10 NOTE — Progress Notes (Signed)
   Office Visit Note   [name]           Date of Birth: 06-03-1946           MRN: [id number] Visit Date: 06/10/2019 Requested by: Kathyrn Lass, [place],  [place] [address] PCP: Kathyrn Lass, MD  Subjective: Chief Complaint  Patient presents with  . Right Hip - Pain, Follow-up    MRI review. Stopped nabumetone and gabapentin due to swelling in both legs - unsure which was causing the problem.    HPI: Here for follow-up chronic right hip pain.  Pain in the posterior and anterior hip.  I have given him an ultrasound-guided injection and Dr. Ernestina Patches did a fluoroscopically guided injection, each time he had excellent relief during the immediate anesthetic phase but no long-term relief.  He had an MRI scan done recently and is here to discuss the results.              ROS: Fevers or chills.  All other systems were reviewed and are negative.  Objective: Vital Signs: There were no vitals taken for this visit.  Physical Exam:  General:  Alert and oriented, in no acute distress. Pulm:  Breathing unlabored. Psy:  Normal mood, congruent affect.  Still has some pain with passive hip flexion and internal rotation as well as decreased range of motion.  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  Assessment & Plan: 1.  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  I recommended consulting with Dr. [name] for hip replacement.     Procedures: No procedures performed  No notes on file     PMFS History: Patient Active Problem List   Diagnosis Date Noted  . Lumbar radiculopathy 09/24/2018  . Foraminal stenosis of lumbar region 09/24/2018  . Spondylosis without myelopathy or radiculopathy, lumbar region 08/29/2016   . Chronic left-sided low back pain without sciatica 08/29/2016   No past medical history on file.  No family history on file.  No past surgical history on file. Social History   Occupational History  . Not on file  Tobacco Use  . Smoking status: Never Smoker  . Smokeless tobacco: Never Used  Substance and Sexual Activity  . Alcohol use: Not on file  . Drug use: Not on file  . Sexual activity: Not on file

## 2019-06-11 ENCOUNTER — Ambulatory Visit: Payer: Medicare HMO | Admitting: Physical Therapy

## 2019-06-11 ENCOUNTER — Encounter: Payer: Self-pay | Admitting: Family Medicine

## 2019-06-15 ENCOUNTER — Other Ambulatory Visit: Payer: Self-pay | Admitting: Physical Medicine and Rehabilitation

## 2019-06-17 ENCOUNTER — Ambulatory Visit (INDEPENDENT_AMBULATORY_CARE_PROVIDER_SITE_OTHER): Payer: Medicare HMO | Admitting: Orthopaedic Surgery

## 2019-06-17 VITALS — Ht 68.0 in | Wt 220.0 lb

## 2019-06-17 DIAGNOSIS — M1611 Unilateral primary osteoarthritis, right hip: Secondary | ICD-10-CM

## 2019-06-17 NOTE — Telephone Encounter (Signed)
Please advise 

## 2019-06-17 NOTE — Progress Notes (Signed)
Office Visit Note   [name]           Date of Birth: [birth year]           MRN: [id number] Visit Date: 06/17/2019              Requested by: Kathyrn Lass, [place],  [place] [address] PCP: Kathyrn Lass, MD   Assessment & Plan: Visit Diagnoses:  1. Unilateral primary osteoarthritis, right hip     Plan: At this point given his clinical exam findings combined with his x-ray and MRI findings as well as the failure of conservative treatment, we are recommending hip replacement surgery.  I had a long and thorough discussion with him about the risk and benefits of surgery.  We talked about his x-ray findings.  I showed him a hip model explaining what the surgery involves in detail.  We talked about his interoperative and postoperative course in significant detail.  All question concerns were answered and addressed.  He would like to talk with surgery scheduler today about the potential for scheduling surgery in the future. Follow-Up Instructions: Return for 2 weeks post-op.   Orders:  No orders of the defined types were placed in this encounter.  No orders of the defined types were placed in this encounter.     Procedures: No procedures performed   Clinical Data: No additional findings.   Subjective: Chief Complaint  Patient presents with   Right Hip - Pain  Patient comes in today for discussion, evaluation and treatment for right hip osteoarthritis with a dysplastic hip.  Discussion will be about hip replacement surgery.  He has had intra-articular steroid injections in the right hip that helped for a little while.  He has been dealing with hip pain for several decades now.  It is worsened over the last 6 months to a year.  He used to be an avid runner.  He is 73 years old still very active.  He does a lot of walking.  He has pain daily in his right hip and its in the groin area.  Injections helped for a little bit.  He even has a brother that had a  hip replacement at age 24.  At this point his hip pain is detrimentally affecting his activities of living, his quality of life and his mobility.  He is tried and failed all forms of conservative treatment.  He is not a diabetic.  HPI  Review of Systems He currently denies any headache, chest pain, shortness of breath, fever, chills, nausea, vomiting  Objective: Vital Signs: Ht 5\' 8"  (1.727 m)    Wt 220 lb (99.8 kg)    BMI 33.45 kg/m   Physical Exam He is alert and orient x3 in no acute distress Ortho Exam Examination of his left hip is normal examination of his right hip shows severe pain with internal and external rotation with significant stiffness with rotation. Specialty Comments:  No specialty comments available.  Imaging: No results found. An AP pelvis and lateral of the right hip is reviewed from previous films as well as the MRI of his right hip.  He does show swelling of the right hip socket.  There is significant cartilage loss in the femoral head and acetabulum as well as edematous changes showing a stress response.  PMFS History: Patient Active Problem List   Diagnosis Date Noted   Unilateral primary osteoarthritis, right hip 06/17/2019   Lumbar radiculopathy 09/24/2018  Foraminal stenosis of lumbar region 09/24/2018   Spondylosis without myelopathy or radiculopathy, lumbar region 08/29/2016   Chronic left-sided low back pain without sciatica 08/29/2016   No past medical history on file.  No family history on file.  No past surgical history on file. Social History   Occupational History   Not on file  Tobacco Use   Smoking status: Never Smoker   Smokeless tobacco: Never Used  Substance and Sexual Activity   Alcohol use: Not on file   Drug use: Not on file   Sexual activity: Not on file

## 2019-06-18 ENCOUNTER — Ambulatory Visit: Payer: Medicare HMO | Admitting: Physical Therapy

## 2019-06-20 ENCOUNTER — Encounter: Payer: Self-pay | Admitting: Physical Therapy

## 2019-06-20 ENCOUNTER — Encounter: Payer: Self-pay | Admitting: Orthopaedic Surgery

## 2019-07-11 ENCOUNTER — Other Ambulatory Visit: Payer: Self-pay | Admitting: Physician Assistant

## 2019-07-12 ENCOUNTER — Other Ambulatory Visit: Payer: Self-pay

## 2019-07-15 NOTE — Patient Instructions (Signed)
DUE TO COVID-19 ONLY ONE VISITOR IS ALLOWED TO COME WITH YOU AND STAY IN THE WAITING ROOM ONLY DURING PRE OP AND PROCEDURE DAY OF SURGERY. THE 1 VISITOR MAY VISIT WITH YOU AFTER SURGERY IN YOUR PRIVATE ROOM DURING VISITING HOURS ONLY!  YOU NEED TO HAVE A COVID 19 TEST ON_11/3/______ @_______ , THIS TEST MUST BE DONE BEFORE SURGERY, COME  [address],   , .  [facility]) ONCE YOUR COVID TEST IS COMPLETED, PLEASE BEGIN THE QUARANTINE INSTRUCTIONS AS OUTLINED IN YOUR HANDOUT.                Gilmer Kaminsky    Your procedure is scheduled on: 07/19/19   Report to [facility] Main  Entrance   Report to Short Stay at 5:30  AM     Call this number if you have problems the morning of surgery [phone]     BRUSH YOUR TEETH MORNING OF SURGERY AND RINSE YOUR MOUTH OUT, NO CHEWING GUM CANDY OR MINTS.   Do not eat food After Midnight.   YOU MAY HAVE CLEAR LIQUIDS FROM MIDNIGHT UNTIL 4:30AM.   At 4:30AM Please finish the prescribed Pre-Surgery  drink.   Nothing by mouth after you finish the  drink !    Take these medicines the morning of surgery with A SIP OF WATER: Nabumetone, Flonase if needed                                 You may not have any metal on your body including piercings              Do not wear jewelry,  lotions, powders or  deodorant                      Men may shave face and neck.   Do not bring valuables to the hospital. [facility] IS NOT             RESPONSIBLE   FOR VALUABLES.  Contacts, dentures or bridgework may not be worn into surgery.      Name and phone number of your driver:  Special Instructions: N/A              Please read over the following fact sheets you were given: _____________________________________________________________________             [facility] - Preparing for Surgery  Before surgery, you can play an important role.   Because skin is not sterile, your skin needs to be as free of  germs as possible .  You can reduce the number of germs on your skin by washing with CHG (chlorahexidine gluconate) soap before surgery.   CHG is an antiseptic cleaner which kills germs and bonds with the skin to continue killing germs even after washing. Please DO NOT use if you have an allergy to CHG or antibacterial soaps.   If your skin becomes reddened/irritated stop using the CHG and inform your nurse when you arrive at Short Stay.   You may shave your face/neck.  Please follow these instructions carefully:  1.  Shower with CHG Soap the night before surgery and the  morning of Surgery.  2.  If you choose to wash your hair, wash your hair first as usual with your  normal  shampoo.  3.  After you shampoo, rinse your hair and body thoroughly to remove  the  shampoo.                                        4.  Use CHG as you would any other liquid soap.  You can apply chg directly  to the skin and wash                       Gently with a scrungie or clean washcloth.  5.  Apply the CHG Soap to your body ONLY FROM THE NECK DOWN.   Do not use on face/ open                           Wound or open sores. Avoid contact with eyes, ears mouth and genitals (private parts).                       Wash face,  Genitals (private parts) with your normal soap.             6.  Wash thoroughly, paying special attention to the area where your surgery  will be performed.  7.  Thoroughly rinse your body with warm water from the neck down.  8.  DO NOT shower/wash with your normal soap after using and rinsing off  the CHG Soap.             9.  Pat yourself dry with a clean towel.            10.  Wear clean pajamas.            11.  Place clean sheets on your bed the night of your first shower and do not  sleep with pets. Day of Surgery : Do not apply any lotions/deodorants the morning of surgery.  Please wear clean clothes to the hospital/surgery center.  FAILURE TO FOLLOW THESE INSTRUCTIONS MAY RESULT IN THE  CANCELLATION OF YOUR SURGERY PATIENT SIGNATURE_________________________________  NURSE SIGNATURE__________________________________  ________________________________________________________________________   Adam Phenix  An incentive spirometer is a tool that can help keep your lungs clear and active. This tool measures how well you are filling your lungs with each breath. Taking long deep breaths may help reverse or decrease the chance of developing breathing (pulmonary) problems (especially infection) following:  A long period of time when you are unable to move or be active. BEFORE THE PROCEDURE   If the spirometer includes an indicator to show your best effort, your nurse or respiratory therapist will set it to a desired goal.  If possible, sit up straight or lean slightly forward. Try not to slouch.  Hold the incentive spirometer in an upright position. INSTRUCTIONS FOR USE  1. Sit on the edge of your bed if possible, or sit up as far as you can in bed or on a chair. 2. Hold the incentive spirometer in an upright position. 3. Breathe out normally. 4. Place the mouthpiece in your mouth and seal your lips tightly around it. 5. Breathe in slowly and as deeply as possible, raising the piston or the ball toward the top of the column. 6. Hold your breath for 3-5 seconds or for as long as possible. Allow the piston or ball to fall to the bottom of the column. 7. Remove the mouthpiece from your mouth and breathe out normally. 8. Rest for a few  seconds and repeat Steps 1 through 7 at least 10 times every 1-2 hours when you are awake. Take your time and take a few normal breaths between deep breaths. 9. The spirometer may include an indicator to show your best effort. Use the indicator as a goal to work toward during each repetition. 10. After each set of 10 deep breaths, practice coughing to be sure your lungs are clear. If you have an incision (the cut made at the time of surgery),  support your incision when coughing by placing a pillow or rolled up towels firmly against it. Once you are able to get out of bed, walk around indoors and cough well. You may stop using the incentive spirometer when instructed by your caregiver.  RISKS AND COMPLICATIONS  Take your time so you do not get dizzy or light-headed.  If you are in pain, you may need to take or ask for pain medication before doing incentive spirometry. It is harder to take a deep breath if you are having pain. AFTER USE  Rest and breathe slowly and easily.  It can be helpful to keep track of a log of your progress. Your caregiver can provide you with a simple table to help with this. If you are using the spirometer at home, follow these instructions: SEEK MEDICAL CARE IF:   You are having difficultly using the spirometer.  You have trouble using the spirometer as often as instructed.  Your pain medication is not giving enough relief while using the spirometer.  You develop fever of 100.5 F (38.1 C) or higher. SEEK IMMEDIATE MEDICAL CARE IF:   You cough up bloody sputum that had not been present before.  You develop fever of 102 F (38.9 C) or greater.  You develop worsening pain at or near the incision site. MAKE SURE YOU:   Understand these instructions.  Will watch your condition.  Will get help right away if you are not doing well or get worse. Document Released: 01/09/2007 Document Revised: 11/21/2011 Document Reviewed: 03/12/2007 [facility] Patient Information 2014 [employer], [place].   ________________________________________________________________________

## 2019-07-16 ENCOUNTER — Encounter (HOSPITAL_COMMUNITY): Payer: Self-pay

## 2019-07-16 ENCOUNTER — Encounter (HOSPITAL_COMMUNITY)
Admission: RE | Admit: 2019-07-16 | Discharge: 2019-07-16 | Disposition: A | Discharge status: Home / Self Care | Payer: Medicare HMO | Source: Ambulatory Visit | Attending: Orthopaedic Surgery | Admitting: Orthopaedic Surgery

## 2019-07-16 ENCOUNTER — Other Ambulatory Visit: Payer: Self-pay

## 2019-07-16 ENCOUNTER — Other Ambulatory Visit (HOSPITAL_COMMUNITY)
Admission: RE | Admit: 2019-07-16 | Discharge: 2019-07-16 | Disposition: A | Discharge status: Home / Self Care | Payer: Medicare HMO | Source: Ambulatory Visit | Attending: Orthopaedic Surgery | Admitting: Orthopaedic Surgery

## 2019-07-16 DIAGNOSIS — Z20828 Contact with and (suspected) exposure to other viral communicable diseases: Secondary | ICD-10-CM | POA: Insufficient documentation

## 2019-07-16 DIAGNOSIS — Z01818 Encounter for other preprocedural examination: Secondary | ICD-10-CM | POA: Diagnosis not present

## 2019-07-16 HISTORY — DX: Unspecified osteoarthritis, unspecified site: M19.90

## 2019-07-16 HISTORY — DX: Essential (primary) hypertension: I10

## 2019-07-16 HISTORY — DX: Polyneuropathy, unspecified: G62.9

## 2019-07-16 LAB — CBC
HCT: 44.3 % (ref 39.0–52.0)
Hemoglobin: 14.9 g/dL (ref 13.0–17.0)
MCH: 30.9 pg (ref 26.0–34.0)
MCHC: 33.6 g/dL (ref 30.0–36.0)
MCV: 91.9 fL (ref 80.0–100.0)
Platelets: 227 10*3/uL (ref 150–400)
RBC: 4.82 MIL/uL (ref 4.22–5.81)
RDW: 11.4 % — ABNORMAL LOW (ref 11.5–15.5)
WBC: 6.8 10*3/uL (ref 4.0–10.5)
nRBC: 0 % (ref 0.0–0.2)

## 2019-07-16 LAB — SURGICAL PCR SCREEN
MRSA, PCR: NEGATIVE
Staphylococcus aureus: POSITIVE — AB

## 2019-07-16 NOTE — Progress Notes (Addendum)
PCP - Dr. Garlon Hatchet Cardiologist - none  Chest x-ray - no EKG - 07/16/19 Stress Test - no ECHO - no Cardiac Cath - no  Sleep Study - no Stop bang score is 4 and pt feels it is getting worse with age. He plans to address it after surgery CPAP - no  Fasting Blood Sugar - NA Checks Blood Sugar _____ times a day  Blood Thinner Instructions:NA Aspirin Instructions: Last Dose:  Anesthesia review:   Patient denies shortness of breath, fever, cough and chest pain at PAT appointment yes  Patient verbalized understanding of instructions that were given to them at the PAT appointment. Patient was also instructed that they will need to review over the PAT instructions again at home before surgery. yes

## 2019-07-17 LAB — NOVEL CORONAVIRUS, NAA (HOSP ORDER, SEND-OUT TO REF LAB; TAT 18-24 HRS): SARS-CoV-2, NAA: NOT DETECTED

## 2019-07-18 NOTE — H&P (Signed)
TOTAL HIP ADMISSION H&P  Patient is admitted for right total hip arthroplasty.  Subjective:  Chief Complaint: right hip pain  HPI: Ricardo Stewart, 73 y.o. male, has a history of pain and functional disability in the right hip(s) due to arthritis and patient has failed non-surgical conservative treatments for greater than 12 weeks to include NSAID's and/or analgesics, corticosteriod injections, flexibility and strengthening excercises, weight reduction as appropriate and activity modification.  Onset of symptoms was gradual starting >10 years ago with gradually worsening course since that time.The patient noted no past surgery on the right hip(s).  Patient currently rates pain in the right hip at 10 out of 10 with activity. Patient has night pain, worsening of pain with activity and weight bearing, trendelenberg gait, pain that interfers with activities of daily living and pain with passive range of motion. 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 This condition presents safety issues increasing the risk of falls.  There is no current active infection.  Patient Active Problem List   Diagnosis Date Noted  . Unilateral primary osteoarthritis, right hip 06/17/2019  . Lumbar radiculopathy 09/24/2018  . Foraminal stenosis of lumbar region 09/24/2018  . Spondylosis without myelopathy or radiculopathy, lumbar region 08/29/2016  . Chronic left-sided low back pain without sciatica 08/29/2016   Past Medical History:  Diagnosis Date  . Arthritis    hips and spine  . Hypertension   . Peripheral neuropathy    bilat    Past Surgical History:  Procedure Laterality Date  . FRACTURE SURGERY Right    radius and collar bone    No current facility-administered medications for this encounter.    Current Outpatient Medications  Medication Sig Dispense Refill Last Dose  . benazepril-hydrochlorthiazide (LOTENSIN HCT)  20-12.5 MG tablet Take 1 tablet by mouth daily.      . cholecalciferol (VITAMIN D) 25 MCG (1000 UT) tablet Take 1,000 Units by mouth daily.     . Coenzyme Q10 (COQ10) 100 MG CAPS Take 100 mg by mouth daily.     . fluticasone (FLONASE) 50 MCG/ACT nasal spray Place 1-2 sprays into both nostrils daily as needed for allergies.      Marland Kitchen gabapentin (NEURONTIN) 300 MG capsule TAKE 1 CAPSULE BY MOUTH AT NIGHT FOR 7 NIGHTS AND THEN 1 IN THE MORNING AND ONE AT NIGHT FOR 7 DAYS AND THEN 3 TIMES A DAY. (Patient taking differently: Take 300 mg by mouth at bedtime. ) 90 capsule 0   . Glucosamine 750 MG TABS Take 750 mg by mouth 2 (two) times daily.     Marland Kitchen ibuprofen (ADVIL) 200 MG tablet Take 400-600 mg by mouth every 8 (eight) hours as needed (pain.).     Marland Kitchen magnesium oxide (MAG-OX) 400 MG tablet Take 400 mg by mouth daily.     . nabumetone (RELAFEN) 750 MG tablet Take 1 tablet (750 mg total) by mouth 2 (two) times daily as needed. (Patient taking differently: Take 750 mg by mouth 2 (two) times daily as needed (pain.). ) 60 tablet 6   . Omega-3 Fatty Acids (FISH OIL) 1000 MG CAPS Take 1,000 mg by mouth daily.     . simvastatin (ZOCOR) 80 MG tablet Take 40 mg by mouth daily.      . vitamin C (ASCORBIC ACID) 500 MG tablet Take 500 mg by mouth daily.      No Known Allergies  Social History   Tobacco Use  . Smoking status:  Never Smoker  . Smokeless tobacco: Never Used  Substance Use Topics  . Alcohol use: Yes    Alcohol/week: 1.0 standard drinks    Types: 1 Standard drinks or equivalent per week    No family history on file.   Review of Systems  Musculoskeletal: Positive for joint pain.  All other systems reviewed and are negative.   Objective:  Physical Exam  Constitutional: He is oriented to person, place, and time. He appears well-developed and well-nourished.  HENT:  Head: Normocephalic and atraumatic.  Eyes: Pupils are equal, round, and reactive to light. EOM are normal.  Neck: Normal range of  motion. Neck supple.  Cardiovascular: Normal rate.  Respiratory: Effort normal.  GI: Soft.  Musculoskeletal:     Right hip: He exhibits decreased range of motion, decreased strength, tenderness and bony tenderness.  Neurological: He is alert and oriented to person, place, and time.  Skin: Skin is warm and dry.  Psychiatric: He has a normal mood and affect.    Vital signs in last 24 hours:    Labs:   Estimated body mass index is 34.64 kg/m as calculated from the following:   Height as of 07/16/19: 5\' 8"  (1.727 m).   Weight as of 07/16/19: 103.3 kg.   Imaging Review Plain radiographs demonstrate severe degenerative joint disease of the right hip(s). The bone quality appears to be good for age and reported activity level.      Assessment/Plan:  End stage arthritis, right hip(s)  The patient history, physical examination, clinical judgement of the provider and imaging studies are consistent with end stage degenerative joint disease of the right hip(s) and total hip arthroplasty is deemed medically necessary. The treatment options including medical management, injection therapy, arthroscopy and arthroplasty were discussed at length. The risks and benefits of total hip arthroplasty were presented and reviewed. The risks due to aseptic loosening, infection, stiffness, dislocation/subluxation,  thromboembolic complications and other imponderables were discussed.  The patient acknowledged the explanation, agreed to proceed with the plan and consent was signed. Patient is being admitted for inpatient treatment for surgery, pain control, PT, OT, prophylactic antibiotics, VTE prophylaxis, progressive ambulation and ADL's and discharge planning.The patient is planning to be discharged home with home health services

## 2019-07-19 ENCOUNTER — Ambulatory Visit (HOSPITAL_COMMUNITY): Payer: Medicare HMO | Admitting: Anesthesiology

## 2019-07-19 ENCOUNTER — Ambulatory Visit (HOSPITAL_COMMUNITY): Payer: Medicare HMO

## 2019-07-19 ENCOUNTER — Observation Stay (HOSPITAL_COMMUNITY): Payer: Medicare HMO

## 2019-07-19 ENCOUNTER — Observation Stay (HOSPITAL_COMMUNITY)
Admission: RE | Admit: 2019-07-19 | Discharge: 2019-07-20 | Disposition: A | Discharge status: Home Health | Payer: Medicare HMO | Attending: Orthopaedic Surgery | Admitting: Orthopaedic Surgery

## 2019-07-19 ENCOUNTER — Ambulatory Visit (HOSPITAL_COMMUNITY): Payer: Medicare HMO | Admitting: Physician Assistant

## 2019-07-19 ENCOUNTER — Other Ambulatory Visit: Payer: Self-pay

## 2019-07-19 ENCOUNTER — Encounter (HOSPITAL_COMMUNITY)
Admission: RE | Disposition: A | Discharge status: Home Health | Payer: Self-pay | Source: Home / Self Care | Attending: Orthopaedic Surgery

## 2019-07-19 ENCOUNTER — Encounter (HOSPITAL_COMMUNITY): Payer: Self-pay | Admitting: Anesthesiology

## 2019-07-19 DIAGNOSIS — Z6834 Body mass index (BMI) 34.0-34.9, adult: Secondary | ICD-10-CM | POA: Diagnosis not present

## 2019-07-19 DIAGNOSIS — M48061 Spinal stenosis, lumbar region without neurogenic claudication: Secondary | ICD-10-CM | POA: Diagnosis not present

## 2019-07-19 DIAGNOSIS — M5416 Radiculopathy, lumbar region: Secondary | ICD-10-CM | POA: Insufficient documentation

## 2019-07-19 DIAGNOSIS — Z96641 Presence of right artificial hip joint: Secondary | ICD-10-CM | POA: Diagnosis not present

## 2019-07-19 DIAGNOSIS — Z79899 Other long term (current) drug therapy: Secondary | ICD-10-CM | POA: Diagnosis not present

## 2019-07-19 DIAGNOSIS — Q6589 Other specified congenital deformities of hip: Secondary | ICD-10-CM | POA: Diagnosis not present

## 2019-07-19 DIAGNOSIS — Z419 Encounter for procedure for purposes other than remedying health state, unspecified: Secondary | ICD-10-CM

## 2019-07-19 DIAGNOSIS — M4726 Other spondylosis with radiculopathy, lumbar region: Secondary | ICD-10-CM | POA: Diagnosis not present

## 2019-07-19 DIAGNOSIS — I10 Essential (primary) hypertension: Secondary | ICD-10-CM | POA: Insufficient documentation

## 2019-07-19 DIAGNOSIS — M4716 Other spondylosis with myelopathy, lumbar region: Secondary | ICD-10-CM | POA: Diagnosis not present

## 2019-07-19 DIAGNOSIS — G629 Polyneuropathy, unspecified: Secondary | ICD-10-CM | POA: Insufficient documentation

## 2019-07-19 DIAGNOSIS — E669 Obesity, unspecified: Secondary | ICD-10-CM | POA: Diagnosis not present

## 2019-07-19 DIAGNOSIS — Z471 Aftercare following joint replacement surgery: Secondary | ICD-10-CM | POA: Diagnosis not present

## 2019-07-19 DIAGNOSIS — M1611 Unilateral primary osteoarthritis, right hip: Secondary | ICD-10-CM | POA: Diagnosis not present

## 2019-07-19 HISTORY — PX: TOTAL HIP ARTHROPLASTY: SHX124

## 2019-07-19 SURGERY — ARTHROPLASTY, HIP, TOTAL, ANTERIOR APPROACH
Anesthesia: Monitor Anesthesia Care | Site: Hip | Laterality: Right

## 2019-07-19 MED ORDER — POVIDONE-IODINE 10 % EX SWAB
2.0000 "application " | Freq: Once | CUTANEOUS | Status: AC
  Administered 2019-07-19: 2 via TOPICAL

## 2019-07-19 MED ORDER — ONDANSETRON HCL 4 MG/2ML IJ SOLN: 4.0000 mg | Freq: Four times a day (QID) | INTRAMUSCULAR | Status: DC | PRN

## 2019-07-19 MED ORDER — ONDANSETRON HCL 4 MG PO TABS: 4.0000 mg | ORAL_TABLET | Freq: Four times a day (QID) | ORAL | Status: DC | PRN

## 2019-07-19 MED ORDER — PANTOPRAZOLE SODIUM 40 MG PO TBEC
40.0000 mg | DELAYED_RELEASE_TABLET | Freq: Every day | ORAL | Status: DC
  Administered 2019-07-19 – 2019-07-20 (×2): 40 mg via ORAL
  Filled 2019-07-19 (×2): qty 1

## 2019-07-19 MED ORDER — OXYCODONE HCL 5 MG/5ML PO SOLN: 5.0000 mg | Freq: Once | ORAL | Status: DC | PRN

## 2019-07-19 MED ORDER — STERILE WATER FOR IRRIGATION IR SOLN
Status: DC | PRN
  Administered 2019-07-19 (×2): 1000 mL

## 2019-07-19 MED ORDER — ASPIRIN 81 MG PO CHEW
81.0000 mg | CHEWABLE_TABLET | Freq: Two times a day (BID) | ORAL | Status: DC
  Administered 2019-07-19 – 2019-07-20 (×2): 81 mg via ORAL
  Filled 2019-07-19 (×2): qty 1

## 2019-07-19 MED ORDER — DOCUSATE SODIUM 100 MG PO CAPS
100.0000 mg | ORAL_CAPSULE | Freq: Two times a day (BID) | ORAL | Status: DC
  Administered 2019-07-19 – 2019-07-20 (×3): 100 mg via ORAL
  Filled 2019-07-19 (×3): qty 1

## 2019-07-19 MED ORDER — BENAZEPRIL-HYDROCHLOROTHIAZIDE 20-12.5 MG PO TABS: 1.0000 | ORAL_TABLET | Freq: Every day | ORAL | Status: DC

## 2019-07-19 MED ORDER — ONDANSETRON HCL 4 MG/2ML IJ SOLN
INTRAMUSCULAR | Status: DC | PRN
  Administered 2019-07-19: 4 mg via INTRAVENOUS

## 2019-07-19 MED ORDER — FENTANYL CITRATE (PF) 100 MCG/2ML IJ SOLN: 25.0000 ug | INTRAMUSCULAR | Status: DC | PRN

## 2019-07-19 MED ORDER — ONDANSETRON HCL 4 MG/2ML IJ SOLN
INTRAMUSCULAR | Status: AC
  Filled 2019-07-19: qty 2

## 2019-07-19 MED ORDER — METOCLOPRAMIDE HCL 5 MG/ML IJ SOLN: 5.0000 mg | Freq: Three times a day (TID) | INTRAMUSCULAR | Status: DC | PRN

## 2019-07-19 MED ORDER — SODIUM CHLORIDE 0.9 % IV SOLN
INTRAVENOUS | Status: DC
  Administered 2019-07-19: 75 mL/h via INTRAVENOUS
  Administered 2019-07-20: 01:00:00 via INTRAVENOUS

## 2019-07-19 MED ORDER — PROPOFOL 10 MG/ML IV BOLUS
INTRAVENOUS | Status: AC
  Filled 2019-07-19: qty 20

## 2019-07-19 MED ORDER — DIPHENHYDRAMINE HCL 12.5 MG/5ML PO ELIX: 12.5000 mg | ORAL_SOLUTION | ORAL | Status: DC | PRN

## 2019-07-19 MED ORDER — OXYCODONE HCL 5 MG PO TABS
5.0000 mg | ORAL_TABLET | ORAL | Status: DC | PRN
  Administered 2019-07-19 (×2): 5 mg via ORAL
  Administered 2019-07-19 – 2019-07-20 (×5): 10 mg via ORAL
  Filled 2019-07-19 (×3): qty 2
  Filled 2019-07-19: qty 1
  Filled 2019-07-19 (×3): qty 2

## 2019-07-19 MED ORDER — EPHEDRINE 5 MG/ML INJ
INTRAVENOUS | Status: AC
  Filled 2019-07-19: qty 10

## 2019-07-19 MED ORDER — LIDOCAINE 2% (20 MG/ML) 5 ML SYRINGE
INTRAMUSCULAR | Status: AC
  Filled 2019-07-19: qty 5

## 2019-07-19 MED ORDER — ALUM & MAG HYDROXIDE-SIMETH 200-200-20 MG/5ML PO SUSP: 30.0000 mL | ORAL | Status: DC | PRN

## 2019-07-19 MED ORDER — PHENOL 1.4 % MT LIQD: 1.0000 | OROMUCOSAL | Status: DC | PRN

## 2019-07-19 MED ORDER — METOCLOPRAMIDE HCL 5 MG PO TABS: 5.0000 mg | ORAL_TABLET | Freq: Three times a day (TID) | ORAL | Status: DC | PRN

## 2019-07-19 MED ORDER — VITAMIN D 25 MCG (1000 UNIT) PO TABS
1000.0000 [IU] | ORAL_TABLET | Freq: Every day | ORAL | Status: DC
  Administered 2019-07-20: 1000 [IU] via ORAL
  Filled 2019-07-19: qty 1

## 2019-07-19 MED ORDER — BUPIVACAINE IN DEXTROSE 0.75-8.25 % IT SOLN
INTRATHECAL | Status: DC | PRN
  Administered 2019-07-19: 2 mL via INTRATHECAL

## 2019-07-19 MED ORDER — FENTANYL CITRATE (PF) 100 MCG/2ML IJ SOLN
INTRAMUSCULAR | Status: AC
  Filled 2019-07-19: qty 2

## 2019-07-19 MED ORDER — POLYETHYLENE GLYCOL 3350 17 G PO PACK: 17.0000 g | PACK | Freq: Every day | ORAL | Status: DC | PRN

## 2019-07-19 MED ORDER — TRANEXAMIC ACID-NACL 1000-0.7 MG/100ML-% IV SOLN
1000.0000 mg | INTRAVENOUS | Status: AC
  Administered 2019-07-19: 1000 mg via INTRAVENOUS
  Filled 2019-07-19: qty 100

## 2019-07-19 MED ORDER — PROPOFOL 500 MG/50ML IV EMUL
INTRAVENOUS | Status: AC
  Filled 2019-07-19: qty 50

## 2019-07-19 MED ORDER — DEXAMETHASONE SODIUM PHOSPHATE 10 MG/ML IJ SOLN
INTRAMUSCULAR | Status: DC | PRN
  Administered 2019-07-19: 10 mg via INTRAVENOUS

## 2019-07-19 MED ORDER — FENTANYL CITRATE (PF) 100 MCG/2ML IJ SOLN
INTRAMUSCULAR | Status: DC | PRN
  Administered 2019-07-19: 100 ug via INTRAVENOUS

## 2019-07-19 MED ORDER — LACTATED RINGERS IV SOLN
INTRAVENOUS | Status: DC
  Administered 2019-07-19 (×2): via INTRAVENOUS

## 2019-07-19 MED ORDER — OXYCODONE HCL 5 MG PO TABS: 5.0000 mg | ORAL_TABLET | Freq: Once | ORAL | Status: DC | PRN

## 2019-07-19 MED ORDER — CEFAZOLIN SODIUM-DEXTROSE 1-4 GM/50ML-% IV SOLN
1.0000 g | Freq: Four times a day (QID) | INTRAVENOUS | Status: AC
  Administered 2019-07-19 (×2): 1 g via INTRAVENOUS
  Filled 2019-07-19 (×2): qty 50

## 2019-07-19 MED ORDER — EPHEDRINE SULFATE-NACL 50-0.9 MG/10ML-% IV SOSY
PREFILLED_SYRINGE | INTRAVENOUS | Status: DC | PRN
  Administered 2019-07-19 (×4): 10 mg via INTRAVENOUS

## 2019-07-19 MED ORDER — MIDAZOLAM HCL 2 MG/2ML IJ SOLN
INTRAMUSCULAR | Status: AC
  Filled 2019-07-19: qty 2

## 2019-07-19 MED ORDER — ACETAMINOPHEN 325 MG PO TABS: 325.0000 mg | ORAL_TABLET | Freq: Four times a day (QID) | ORAL | Status: DC | PRN

## 2019-07-19 MED ORDER — MIDAZOLAM HCL 5 MG/5ML IJ SOLN
INTRAMUSCULAR | Status: DC | PRN
  Administered 2019-07-19: 2 mg via INTRAVENOUS

## 2019-07-19 MED ORDER — METHOCARBAMOL 500 MG IVPB - SIMPLE MED
500.0000 mg | Freq: Four times a day (QID) | INTRAVENOUS | Status: DC | PRN
  Filled 2019-07-19: qty 50

## 2019-07-19 MED ORDER — HYDROMORPHONE HCL 1 MG/ML IJ SOLN: 0.5000 mg | INTRAMUSCULAR | Status: DC | PRN

## 2019-07-19 MED ORDER — DEXAMETHASONE SODIUM PHOSPHATE 10 MG/ML IJ SOLN
INTRAMUSCULAR | Status: AC
  Filled 2019-07-19: qty 1

## 2019-07-19 MED ORDER — CEFAZOLIN SODIUM-DEXTROSE 2-4 GM/100ML-% IV SOLN
2.0000 g | INTRAVENOUS | Status: AC
  Administered 2019-07-19: 07:00:00 2 g via INTRAVENOUS
  Filled 2019-07-19: qty 100

## 2019-07-19 MED ORDER — CHLORHEXIDINE GLUCONATE 4 % EX LIQD: 60.0000 mL | Freq: Once | CUTANEOUS | Status: DC

## 2019-07-19 MED ORDER — VITAMIN C 500 MG PO TABS
500.0000 mg | ORAL_TABLET | Freq: Every day | ORAL | Status: DC
  Administered 2019-07-19 – 2019-07-20 (×2): 500 mg via ORAL
  Filled 2019-07-19 (×2): qty 1

## 2019-07-19 MED ORDER — METHOCARBAMOL 500 MG PO TABS
500.0000 mg | ORAL_TABLET | Freq: Four times a day (QID) | ORAL | Status: DC | PRN
  Administered 2019-07-19 – 2019-07-20 (×4): 500 mg via ORAL
  Filled 2019-07-19 (×4): qty 1

## 2019-07-19 MED ORDER — PROPOFOL 500 MG/50ML IV EMUL
INTRAVENOUS | Status: DC | PRN
  Administered 2019-07-19: 75 ug/kg/min via INTRAVENOUS

## 2019-07-19 MED ORDER — MENTHOL 3 MG MT LOZG: 1.0000 | LOZENGE | OROMUCOSAL | Status: DC | PRN

## 2019-07-19 MED ORDER — GABAPENTIN 300 MG PO CAPS
300.0000 mg | ORAL_CAPSULE | Freq: Every day | ORAL | Status: DC
  Administered 2019-07-19: 300 mg via ORAL
  Filled 2019-07-19: qty 1

## 2019-07-19 MED ORDER — TAMSULOSIN HCL 0.4 MG PO CAPS
0.4000 mg | ORAL_CAPSULE | Freq: Every day | ORAL | Status: DC
  Administered 2019-07-19: 0.4 mg via ORAL
  Filled 2019-07-19: qty 1

## 2019-07-19 MED ORDER — FLUTICASONE PROPIONATE 50 MCG/ACT NA SUSP
1.0000 | Freq: Every day | NASAL | Status: DC | PRN
  Filled 2019-07-19: qty 16

## 2019-07-19 MED ORDER — BENAZEPRIL HCL 20 MG PO TABS
20.0000 mg | ORAL_TABLET | Freq: Every day | ORAL | Status: DC
  Administered 2019-07-20: 20 mg via ORAL
  Filled 2019-07-19: qty 1

## 2019-07-19 MED ORDER — MAGNESIUM OXIDE 400 (241.3 MG) MG PO TABS
400.0000 mg | ORAL_TABLET | Freq: Every day | ORAL | Status: DC
  Administered 2019-07-19 – 2019-07-20 (×2): 400 mg via ORAL
  Filled 2019-07-19 (×2): qty 1

## 2019-07-19 MED ORDER — HYDROCHLOROTHIAZIDE 12.5 MG PO CAPS
12.5000 mg | ORAL_CAPSULE | Freq: Every day | ORAL | Status: DC
  Administered 2019-07-20: 12.5 mg via ORAL
  Filled 2019-07-19: qty 1

## 2019-07-19 MED ORDER — OXYCODONE HCL 5 MG PO TABS
10.0000 mg | ORAL_TABLET | ORAL | Status: DC | PRN
  Filled 2019-07-19: qty 2

## 2019-07-19 MED ORDER — COQ10 100 MG PO CAPS: 100.0000 mg | ORAL_CAPSULE | Freq: Every day | ORAL | Status: DC

## 2019-07-19 MED ORDER — 0.9 % SODIUM CHLORIDE (POUR BTL) OPTIME
TOPICAL | Status: DC | PRN
  Administered 2019-07-19: 1000 mL

## 2019-07-19 MED ORDER — SODIUM CHLORIDE 0.9 % IR SOLN
Status: DC | PRN
  Administered 2019-07-19: 1000 mL

## 2019-07-19 SURGICAL SUPPLY — 40 items
BAG ZIPLOCK 12X15 (MISCELLANEOUS) IMPLANT
BENZOIN TINCTURE PRP APPL 2/3 (GAUZE/BANDAGES/DRESSINGS) IMPLANT
BLADE SAW SGTL 18X1.27X75 (BLADE) ×2 IMPLANT
BLADE SAW SGTL 18X1.27X75MM (BLADE) ×1
CLOSURE WOUND 1/2 X4 (GAUZE/BANDAGES/DRESSINGS)
COVER PERINEAL POST (MISCELLANEOUS) ×3 IMPLANT
COVER SURGICAL LIGHT HANDLE (MISCELLANEOUS) ×3 IMPLANT
COVER WAND RF STERILE (DRAPES) ×3 IMPLANT
DRAPE STERI IOBAN 125X83 (DRAPES) ×3 IMPLANT
DRAPE U-SHAPE 47X51 STRL (DRAPES) ×6 IMPLANT
DRSG AQUACEL AG ADV 3.5X10 (GAUZE/BANDAGES/DRESSINGS) ×3 IMPLANT
DURAPREP 26ML APPLICATOR (WOUND CARE) ×3 IMPLANT
ELECT REM PT RETURN 15FT ADLT (MISCELLANEOUS) ×3 IMPLANT
GAUZE XEROFORM 1X8 LF (GAUZE/BANDAGES/DRESSINGS) ×3 IMPLANT
GLOVE BIO SURGEON STRL SZ7.5 (GLOVE) ×3 IMPLANT
GLOVE BIOGEL PI IND STRL 8 (GLOVE) ×2 IMPLANT
GLOVE BIOGEL PI INDICATOR 8 (GLOVE) ×4
GLOVE ECLIPSE 8.0 STRL XLNG CF (GLOVE) ×3 IMPLANT
GOWN STRL REUS W/TWL XL LVL3 (GOWN DISPOSABLE) ×6 IMPLANT
HANDPIECE INTERPULSE COAX TIP (DISPOSABLE) ×2
HEAD CERAMIC 36 PLUS5 (Hips) ×2 IMPLANT
HOLDER FOLEY CATH W/STRAP (MISCELLANEOUS) ×3 IMPLANT
KIT TURNOVER KIT A (KITS) IMPLANT
LINER ACETAB NEUTRAL 36ID 520D (Liner) ×2 IMPLANT
PACK ANTERIOR HIP CUSTOM (KITS) ×3 IMPLANT
PIN SECTOR W/GRIP ACE CUP 52MM (Hips) ×2 IMPLANT
SCREW PINN CAN 6.5X20 (Screw) ×2 IMPLANT
SET HNDPC FAN SPRY TIP SCT (DISPOSABLE) ×1 IMPLANT
STAPLER VISISTAT 35W (STAPLE) IMPLANT
STEM FEM ACTIS STD SZ7 (Nail) ×2 IMPLANT
STRIP CLOSURE SKIN 1/2X4 (GAUZE/BANDAGES/DRESSINGS) IMPLANT
SUT ETHIBOND NAB CT1 #1 30IN (SUTURE) ×3 IMPLANT
SUT ETHILON 2 0 PS N (SUTURE) IMPLANT
SUT MNCRL AB 4-0 PS2 18 (SUTURE) IMPLANT
SUT VIC AB 0 CT1 36 (SUTURE) ×3 IMPLANT
SUT VIC AB 1 CT1 36 (SUTURE) ×3 IMPLANT
SUT VIC AB 2-0 CT1 27 (SUTURE) ×4
SUT VIC AB 2-0 CT1 TAPERPNT 27 (SUTURE) ×2 IMPLANT
TRAY FOLEY MTR SLVR 16FR STAT (SET/KITS/TRAYS/PACK) IMPLANT
YANKAUER SUCT BULB TIP 10FT TU (MISCELLANEOUS) ×3 IMPLANT

## 2019-07-19 NOTE — Evaluation (Signed)
Physical Therapy Evaluation Patient Details Name: Ricardo Stewart MRN: [id number] DOB: 20-Sep-1945 Today's Date: 07/19/2019   History of Present Illness  Patient is 73 y.o. male s/p Rt THA anterior approach on 07/19/19 with PMH significant for peripheral neuropathy, OA, HTN.    Clinical Impression  Ricardo Stewart is a 73 y.o. male POD 0 s/p Rt THA anterior approach. Patient reports independence with mobility at baseline with recent use of SPC due to increased hip pain. Patient is now limited by functional impairments (see PT problem list below) and requires min guard for transfers and gait with RW. Patient was able to ambulate ~120 feet with RW with cues for safe walker management, no overt LOB. Patient instructed in exercise to facilitate ROM and circulation. Patient will benefit from continued skilled PT interventions to address impairments and progress towards PLOF. Acute PT will follow to progress mobility and stair training in preparation for safe discharge home.    Follow Up Recommendations Follow surgeon's recommendation for DC plan and follow-up therapies    Equipment Recommendations  Rolling walker with 5" wheels    Recommendations for Other Services       Precautions / Restrictions Precautions Precautions: Fall Restrictions Weight Bearing Restrictions: No      Mobility  Bed Mobility Overal bed mobility: Needs Assistance Bed Mobility: Supine to Sit     Supine to sit: HOB elevated;Supervision     General bed mobility comments: no cues needed for technique or use of bed rails, no physical assist required  Transfers Overall transfer level: Needs assistance Equipment used: Rolling walker (2 wheeled) Transfers: Sit to/from Stand Sit to Stand: Min guard         General transfer comment: verbal cues for safe hand placement and technique with RW, no physical assist required to initiate power up, no overt LOB  Ambulation/Gait Ambulation/Gait assistance: Min guard Gait  Distance (Feet): 120 Feet Assistive device: Rolling walker (2 wheeled) Gait Pattern/deviations: Step-through pattern;Decreased stride length;Decreased weight shift to right Gait velocity: decreased   General Gait Details: verbal cues at start for safe step pattern and to maintain close proximity to RW, pt improved throughout, no overt LOB noted during gait  Stairs            Wheelchair Mobility    Modified Rankin (Stroke Patients Only)       Balance Overall balance assessment: Needs assistance Sitting-balance support: Feet supported;No upper extremity supported Sitting balance-Leahy Scale: Good     Standing balance support: During functional activity;Bilateral upper extremity supported Standing balance-Leahy Scale: Fair Standing balance comment: pt able to don mask in standing without support            Pertinent Vitals/Pain Pain Assessment: No/denies pain    Home Living Family/patient expects to be discharged to:: Private residence Living Arrangements: Spouse/significant other Available Help at Discharge: Family;Available 24 hours/day(1 son lives 1-mile away; another son is staying for 2 days, in from [place]. spouse uses RW for balance deficits and can't help physically) Type of Home: House Home Access: Stairs to enter Entrance Stairs-Rails: None Entrance Stairs-Number of Steps: 1 threshold at front door and garage Home Layout: Two level;Able to live on main level with bedroom/bathroom;Full bath on main level Home Equipment: Toilet riser;Grab bars - tub/shower;Shower seat;Cane - single point      Prior Function Level of Independence: Independent;Independent with assistive device(s)         Comments: prior to hip pain worsening pt was walking 3 miles every day, stopped ~ 6  months ago, he began usin SPC ~2 weeks ago due to pain     Hand Dominance   Dominant Hand: Left    Extremity/Trunk Assessment   Upper Extremity Assessment Upper Extremity  Assessment: Overall WFL for tasks assessed    Lower Extremity Assessment Lower Extremity Assessment: Overall WFL for tasks assessed;RLE deficits/detail RLE Deficits / Details: pt with good quad activation, 4/5 for MMT RLE Sensation: WNL RLE Coordination: WNL    Cervical / Trunk Assessment Cervical / Trunk Assessment: Normal  Communication   Communication: No difficulties  Cognition Arousal/Alertness: Awake/alert Behavior During Therapy: WFL for tasks assessed/performed Overall Cognitive Status: Within Functional Limits for tasks assessed      General Comments      Exercises Total Joint Exercises Ankle Circles/Pumps: AROM;Both;Seated;15 reps Heel Slides: AAROM;10 reps;Seated;Right   Assessment/Plan    PT Assessment Patient needs continued PT services  PT Problem List Decreased strength;Decreased balance;Decreased mobility;Decreased range of motion;Decreased activity tolerance;Decreased knowledge of use of DME       PT Treatment Interventions DME instruction;Functional mobility training;Balance training;Patient/family education;Modalities;Gait training;Therapeutic activities;Stair training;Therapeutic exercise    PT Goals (Current goals can be found in the Care Plan section)  Acute Rehab PT Goals Patient Stated Goal: to get back to walking 3 miles a dayf or exercise and get indpendent PT Goal Formulation: With patient Time For Goal Achievement: 07/26/19 Potential to Achieve Goals: Good    Frequency 7X/week    AM-PAC PT "6 Clicks" Mobility  Outcome Measure Help needed turning from your back to your side while in a flat bed without using bedrails?: A Little Help needed moving from lying on your back to sitting on the side of a flat bed without using bedrails?: A Little Help needed moving to and from a bed to a chair (including a wheelchair)?: A Little Help needed standing up from a chair using your arms (e.g., wheelchair or bedside chair)?: A Little Help needed to walk  in hospital room?: A Little Help needed climbing 3-5 steps with a railing? : A Little 6 Click Score: 18    End of Session Equipment Utilized During Treatment: Gait belt Activity Tolerance: Patient tolerated treatment well Patient left: with call bell/phone within reach;in chair;with chair alarm set;with family/visitor present Nurse Communication: Mobility status PT Visit Diagnosis: Muscle weakness (generalized) (M62.81);Difficulty in walking, not elsewhere classified (R26.2)    Time: 8416-6063 PT Time Calculation (min) (ACUTE ONLY): 28 min   Charges:   PT Evaluation $PT Eval Low Complexity: 1 Low PT Treatments $Therapeutic Exercise: 8-22 mins        Valentino Saxon, PT, DPT Physical Therapist with [facility]  07/19/2019 2:44 PM

## 2019-07-19 NOTE — Anesthesia Procedure Notes (Signed)
Date/Time: 07/19/2019 7:12 AM Performed by: Sharlette Dense, CRNA Oxygen Delivery Method: Simple face mask

## 2019-07-19 NOTE — Anesthesia Procedure Notes (Signed)
Spinal  Patient location during procedure: OR Start time: 07/19/2019 7:15 AM End time: 07/19/2019 7:17 AM Staffing Anesthesiologist: Albertha Ghee, MD Performed: anesthesiologist  Preanesthetic Checklist Completed: patient identified, surgical consent, pre-op evaluation, timeout performed, IV checked, risks and benefits discussed and monitors and equipment checked Spinal Block Patient position: sitting Prep: DuraPrep Patient monitoring: cardiac monitor, continuous pulse ox and blood pressure Approach: midline Location: L3-4 Injection technique: single-shot Needle Needle type: Pencan  Needle gauge: 24 G Needle length: 9 cm Assessment Sensory level: T10 Additional Notes Functioning IV was confirmed and monitors were applied. Sterile prep and drape, including hand hygiene and sterile gloves were used. The patient was positioned and the spine was prepped. The skin was anesthetized with lidocaine.  Free flow of clear CSF was obtained prior to injecting local anesthetic into the CSF.  The spinal needle aspirated freely following injection.  The needle was carefully withdrawn.  The patient tolerated the procedure well.

## 2019-07-19 NOTE — Anesthesia Postprocedure Evaluation (Signed)
Anesthesia Post Note  [name]  Procedure(s) Performed: RIGHT TOTAL HIP ARTHROPLASTY ANTERIOR APPROACH (Right Hip)     Patient location during evaluation: PACU Anesthesia Type: MAC and Spinal Level of consciousness: oriented and awake and alert Pain management: pain level controlled Vital Signs Assessment: post-procedure vital signs reviewed and stable Respiratory status: spontaneous breathing, respiratory function stable and patient connected to nasal cannula oxygen Cardiovascular status: blood pressure returned to baseline and stable Postop Assessment: no headache, no backache and no apparent nausea or vomiting Anesthetic complications: no    Last Vitals:  Vitals:   07/19/19 1220 07/19/19 1313  BP: 125/68 114/69  Pulse: (!) 56 63  Resp: 17 16  Temp: (!) 36.4 C 36.5 C  SpO2: 96% 96%    Last Pain:  Vitals:   07/19/19 1313  TempSrc: Oral  PainSc:                  [name]

## 2019-07-19 NOTE — Brief Op Note (Signed)
07/19/2019  8:29 AM  PATIENT:  Ricardo Stewart  73 y.o. male  PRE-OPERATIVE DIAGNOSIS:  osteoarthritis right hip  POST-OPERATIVE DIAGNOSIS:  osteoarthritis right hip  PROCEDURE:  Procedure(s): RIGHT TOTAL HIP ARTHROPLASTY ANTERIOR APPROACH (Right)  SURGEON:  Surgeon(s) and Role:    Mcarthur Rossetti, MD - Primary  PHYSICIAN ASSISTANT:  Benita Stabile, PA-C  ANESTHESIA:   spinal  EBL:  250 mL   COUNTS:  YES  DICTATION: .Other Dictation: Dictation Number [id number]  PLAN OF CARE: Admit to inpatient   PATIENT DISPOSITION:  PACU - hemodynamically stable.   Delay start of Pharmacological VTE agent (>24hrs) due to surgical blood loss or risk of bleeding: no

## 2019-07-19 NOTE — Progress Notes (Signed)
PHARMACIST - PHYSICIAN ORDER COMMUNICATION  CONCERNING: P&T Medication Policy on Herbal Medications  DESCRIPTION:  This patient's order for:  Coenzym Q10  has been noted.  This product(s) is classified as an "herbal" or natural product. Due to a lack of definitive safety studies or FDA approval, nonstandard manufacturing practices, plus the potential risk of unknown drug-drug interactions while on inpatient medications, the Pharmacy and Therapeutics Committee does not permit the use of "herbal" or natural products of this type within [facility].   ACTION TAKEN: The pharmacy department is unable to verify this order at this time and your patient has been informed of this safety policy. Please reevaluate patient's clinical condition at discharge and address if the herbal or natural product(s) should be resumed at that time.  Leone Haven, PharmD

## 2019-07-19 NOTE — Op Note (Signed)
NAMEBERLIN, MOKRY MEDICAL RECORD [id number] ACCOUNT [id number] DATE OF BIRTH:Aug 15, 1946 FACILITY: [facility] LOCATION: [facility] PHYSICIAN:Nasteho Glantz Aretha Parrot, MD  OPERATIVE REPORT  DATE OF PROCEDURE:  07/19/2019  PREOPERATIVE DIAGNOSIS:  Primary osteoarthritis and degenerative joint disease, right hip with congenital hip dysplasia.  POSTOPERATIVE DIAGNOSIS:  Primary osteoarthritis and degenerative joint disease, right hip with congenital hip dysplasia.  PROCEDURE:  Right total hip arthroplasty through direct anterior approach.  IMPLANTS:  DePuy Sector Gription acetabular component size 52 with a single screw, size 36+0 neutral polyethylene liner, size 7 Actis femoral component with standard offset, size 36+5 ceramic hip ball.  SURGEON:  Vanita Panda. [name], MD  ASSISTANT:  Richardean Canal, PA-C  ANESTHESIA:  Spinal.  ANTIBIOTICS:  Two grams IV Ancef.  ESTIMATED BLOOD LOSS:  250 mL.  COMPLICATIONS:  None.  INDICATIONS:  The patient is an active 73 year old gentleman who has debilitating right hip pain.  His x-rays do show flattening of the femoral head and significant shortening of his right lower extremity due to hip disease on the right side.  He also  has significant uncovering of the femoral head with a slightly dysplastic hip socket.  He has tried and failed all forms of conservative treatment and at this point, his right hip pain is daily and it is detrimentally affecting his activities of daily  living, his quality of life and his mobility to the point he does wish to proceed with total hip arthroplasty.  We had a long and thorough discussion about this.  We talked about his x-rays.  We discussed the intraoperative and postoperative course and  talked about the risk of acute blood loss anemia, nerve or vessel injury, fracture, infection, dislocation, DVT and implant failure.  I talked about our goals being decreased pain, improve mobility and overall improve quality of  life.  DESCRIPTION OF PROCEDURE:  After informed consent was obtained and appropriate right hip was marked, he was brought to the operating room and sat up on a stretcher where spinal anesthesia was then obtained.  He was then laid in the supine position.  A  Foley catheter was placed.  I was able to get a good assessment of his leg lengths and he is actually shorter on the right than the left.  Traction boots were placed on both his feet.  Next, I placed him supine on the Hana fracture table, the perineal  post in place and both legs in line skeletal traction device and no traction applied.  His right operative hip was prepped and draped with DuraPrep and sterile drapes.  A time-out was called.  He was identified as the correct patient, correct right hip.   We then made an incision just inferior and posterior to the anterior superior iliac spine and carried this obliquely down the leg.  We dissected down tensor fascia lata muscle.  Tensor fascia was then divided longitudinally to proceed with direct  anterior approach to the hip.  We identified and cauterized circumflex vessels.  I then identified the hip capsule, opened the hip capsule in an L-type format, finding moderate joint effusion.  I placed Cobra retractors around the medial and lateral  femoral neck and then made our femoral neck cut proximal to the lesser trochanter with an oscillating saw and completed this with an osteotome.  A corkscrew guide was placed and the femoral head and removed the femoral head was removed in its entirety  and found it to be significantly diseased and flattened.  I then placed a  bent Hohmann over the medial acetabular rim and removed remnants of the acetabular labrum and other debris.  I then began reaming under direct visualization from a size 44 reamer  in stepwise increments up to a size 51 with all reamers under direct visualization, the last reamer was placed under direct fluoroscopy, so we could obtain our depth  of reaming, our inclination and anteversion.  I then placed the real DePuy Sector  Gription acetabular component size 52 and a 36+0 neutral polyethylene liner.  I did place a single screw as well.  Attention was then turned to the femur.  With the leg externally rotated to 120 degrees, extended and adducted, I was able to place a  Mueller retractor medially and Hohmann retractor above the greater trochanter, released lateral joint capsule and used a box-cutting osteotome to enter the femoral canal and a rongeur to lateralize then began broaching using the Actis broaching system  from a size zero up to a size 7 With a size 7 in place, we trialed a standard neck and we went with a 36-2 hip ball based on him being short and contracted because we had a higher femoral neck cut.  We brought the leg back over and up and reduced this in  the acetabulum.  He definitely needed more leg length and offset.  We dislocated the hip and removed the trial components.  I placed the real Actis femoral component with standard offset size 7 and we went with a 36+5 ceramic hip ball, reduced this in  the acetabulum and I was pleased with leg length, offset, range of motion and stability assessed clinically and radiographically.  We then irrigated the soft tissue with normal saline solution using pulsatile lavage.  I was able to reapproximate most of  the joint capsule with interrupted #1 Ethibond suture.  We used #1 Vicryl to close the tensor fascia, 0 Vicryl was used to close deep tissue, 2-0 Vicryl was used to close subcutaneous tissue and interrupted staples were placed on the skin.  Xeroform and  Aquacel dressing was applied.  He was taken off the Hana table and taken to recovery room in stable condition.  All final counts were correct.  There were no complications noted.  Of note, Benita Stabile, PA-C, assisted the entire case.  His assistance was  crucial for facilitating all aspects of this case.  TN/NUANCE  D:07/19/2019  T:07/19/2019 JOB:[id number]

## 2019-07-19 NOTE — Anesthesia Preprocedure Evaluation (Signed)
Anesthesia Evaluation  Patient identified by MRN, date of birth, ID band Patient awake    Reviewed: Allergy & Precautions, H&P , NPO status , Patient's Chart, lab work & pertinent test results  Airway Mallampati: II   Neck ROM: full    Dental   Pulmonary neg pulmonary ROS,    breath sounds clear to auscultation       Cardiovascular hypertension,  Rhythm:regular Rate:Normal     Neuro/Psych  Neuromuscular disease    GI/Hepatic   Endo/Other  obese  Renal/GU      Musculoskeletal  (+) Arthritis ,   Abdominal   Peds  Hematology   Anesthesia Other Findings   Reproductive/Obstetrics                             Anesthesia Physical Anesthesia Plan  ASA: II  Anesthesia Plan: Spinal and MAC   Post-op Pain Management:    Induction: Intravenous  PONV Risk Score and Plan: 1 and Ondansetron, Propofol infusion, Midazolam and Treatment may vary due to age or medical condition  Airway Management Planned: Simple Face Mask  Additional Equipment:   Intra-op Plan:   Post-operative Plan:   Informed Consent: I have reviewed the patients History and Physical, chart, labs and discussed the procedure including the risks, benefits and alternatives for the proposed anesthesia with the patient or authorized representative who has indicated his/her understanding and acceptance.       Plan Discussed with: CRNA, Anesthesiologist and Surgeon  Anesthesia Plan Comments:         Anesthesia Quick Evaluation

## 2019-07-19 NOTE — H&P (Signed)
The patient is seen and examined this am.  There has been no acute change in his medical status.  See H&P.  He understands we are proceeding with a right total hip replacement.  The risks and benefits of surgery have been discussed and informed consent is obtained.

## 2019-07-19 NOTE — Transfer of Care (Addendum)
Immediate Anesthesia Transfer of Care Note  [name]  Procedure(s) Performed: RIGHT TOTAL HIP ARTHROPLASTY ANTERIOR APPROACH (Right Hip)  Patient Location: PACU  Anesthesia Type:Spinal  Level of Consciousness: awake, alert  and oriented  Airway & Oxygen Therapy: Patient Spontanous Breathing and Patient connected to face mask oxygen  Post-op Assessment: Report given to RN and Post -op Vital signs reviewed and stable  Post vital signs: Reviewed and stable  Last Vitals:  Vitals Value Taken Time  BP 111/71 07/19/19 1015  Temp 36.4 C 07/19/19 1015  Pulse 62 07/19/19 1015  Resp 15 07/19/19 1015  SpO2 98 % 07/19/19 1015    Last Pain:  Vitals:   07/19/19 1015  TempSrc: Oral  PainSc:          Complications: No apparent anesthesia complications

## 2019-07-20 ENCOUNTER — Other Ambulatory Visit: Payer: Self-pay | Admitting: Physical Medicine and Rehabilitation

## 2019-07-20 DIAGNOSIS — Z96641 Presence of right artificial hip joint: Secondary | ICD-10-CM | POA: Diagnosis not present

## 2019-07-20 DIAGNOSIS — M1611 Unilateral primary osteoarthritis, right hip: Secondary | ICD-10-CM | POA: Diagnosis not present

## 2019-07-20 DIAGNOSIS — E669 Obesity, unspecified: Secondary | ICD-10-CM | POA: Diagnosis not present

## 2019-07-20 DIAGNOSIS — I10 Essential (primary) hypertension: Secondary | ICD-10-CM | POA: Diagnosis not present

## 2019-07-20 DIAGNOSIS — M5416 Radiculopathy, lumbar region: Secondary | ICD-10-CM | POA: Diagnosis not present

## 2019-07-20 DIAGNOSIS — Z6834 Body mass index (BMI) 34.0-34.9, adult: Secondary | ICD-10-CM | POA: Diagnosis not present

## 2019-07-20 DIAGNOSIS — Z79899 Other long term (current) drug therapy: Secondary | ICD-10-CM | POA: Diagnosis not present

## 2019-07-20 DIAGNOSIS — Q6589 Other specified congenital deformities of hip: Secondary | ICD-10-CM | POA: Diagnosis not present

## 2019-07-20 DIAGNOSIS — M48061 Spinal stenosis, lumbar region without neurogenic claudication: Secondary | ICD-10-CM | POA: Diagnosis not present

## 2019-07-20 DIAGNOSIS — G629 Polyneuropathy, unspecified: Secondary | ICD-10-CM | POA: Diagnosis not present

## 2019-07-20 LAB — CBC
HCT: 36.4 % — ABNORMAL LOW (ref 39.0–52.0)
Hemoglobin: 11.8 g/dL — ABNORMAL LOW (ref 13.0–17.0)
MCH: 30.7 pg (ref 26.0–34.0)
MCHC: 32.4 g/dL (ref 30.0–36.0)
MCV: 94.8 fL (ref 80.0–100.0)
Platelets: 178 10*3/uL (ref 150–400)
RBC: 3.84 MIL/uL — ABNORMAL LOW (ref 4.22–5.81)
RDW: 11.5 % (ref 11.5–15.5)
WBC: 14.4 10*3/uL — ABNORMAL HIGH (ref 4.0–10.5)
nRBC: 0 % (ref 0.0–0.2)

## 2019-07-20 LAB — BASIC METABOLIC PANEL
Anion gap: 8 (ref 5–15)
BUN: 21 mg/dL (ref 8–23)
CO2: 25 mmol/L (ref 22–32)
Calcium: 8.6 mg/dL — ABNORMAL LOW (ref 8.9–10.3)
Chloride: 102 mmol/L (ref 98–111)
Creatinine, Ser: 0.67 mg/dL (ref 0.61–1.24)
GFR calc Af Amer: >60 mL/min (ref >60)
GFR calc non Af Amer: >60 mL/min (ref >60)
Glucose, Bld: 140 mg/dL — ABNORMAL HIGH (ref 70–99)
Potassium: 4.5 mmol/L (ref 3.5–5.1)
Sodium: 135 mmol/L (ref 135–145)

## 2019-07-20 MED ORDER — OXYCODONE HCL 5 MG PO TABS: 5.0000 mg | ORAL_TABLET | ORAL | 0 refills | Status: DC | PRN

## 2019-07-20 MED ORDER — ASPIRIN 81 MG PO CHEW: 81.0000 mg | CHEWABLE_TABLET | Freq: Two times a day (BID) | ORAL | 0 refills | Status: DC

## 2019-07-20 MED ORDER — METHOCARBAMOL 500 MG PO TABS: 500.0000 mg | ORAL_TABLET | Freq: Four times a day (QID) | ORAL | 1 refills | Status: DC | PRN

## 2019-07-20 NOTE — Progress Notes (Signed)
  Subjective: Patient stable.  Pain controlled.  He has walked in the halls and is ready for discharge   Objective: Vital signs in last 24 hours: Temp:  [97.4 F (36.3 C)-98.6 F (37 C)] 98.2 F (36.8 C) (11/07 0444) Pulse Rate:  [55-70] 68 (11/07 0444) Resp:  [12-18] 16 (11/07 0444) BP: (95-126)/(47-73) 120/65 (11/07 0444) SpO2:  [94 %-100 %] 97 % (11/07 0444) Weight:  [103.3 kg] 103.3 kg (11/06 1015)  Intake/Output from previous day: 11/06 0701 - 11/07 0700 In: 3641.5 [P.O.:600; I.V.:2841.5; IV Piggyback:200] Out: 5956 [Urine:1125; Blood:250] Intake/Output this shift: No intake/output data recorded.  Exam:  Sensation intact distally Intact pulses distally Dorsiflexion/Plantar flexion intact  Labs: Recent Labs    07/20/19 0300  HGB 11.8*   Recent Labs    07/20/19 0300  WBC 14.4*  RBC 3.84*  HCT 36.4*  PLT 178   Recent Labs    07/20/19 0300  NA 135  K 4.5  CL 102  CO2 25  BUN 21  CREATININE 0.67  GLUCOSE 140*  CALCIUM 8.6*   No results for input(s): LABPT, INR in the last 72 hours.  Assessment/Plan: Plan at this time is discharge to home.  Patient is doing well.  Follow-up with Dr. [name] in approximately 10 days to 2 weeks   Ricardo Stewart 07/20/2019, 8:44 AM

## 2019-07-20 NOTE — Discharge Instructions (Signed)

## 2019-07-20 NOTE — Discharge Summary (Signed)
Patient ID: Ricardo Stewart MRN: [id number] DOB/AGE: 09/30/1945 73 y.o.  Admit date: 07/19/2019 Discharge date: 07/20/2019  Admission Diagnoses:  Principal Problem:   Unilateral primary osteoarthritis, right hip Active Problems:   Status post total replacement of right hip   Discharge Diagnoses:  Same  Past Medical History:  Diagnosis Date  . Arthritis    hips and spine  . Hypertension   . Peripheral neuropathy    bilat    Surgeries: Procedure(s): RIGHT TOTAL HIP ARTHROPLASTY ANTERIOR APPROACH on 07/19/2019   Consultants:   Discharged Condition: Improved  Hospital Course: Ricardo Stewart is an 73 y.o. male who was admitted 07/19/2019 for operative treatment ofUnilateral primary osteoarthritis, right hip. Patient has severe unremitting pain that affects sleep, daily activities, and work/hobbies. After pre-op clearance the patient was taken to the operating room on 07/19/2019 and underwent  Procedure(s): RIGHT TOTAL HIP ARTHROPLASTY ANTERIOR APPROACH.    Patient was given perioperative antibiotics:  Anti-infectives (From admission, onward)   Start     Dose/Rate Route Frequency Ordered Stop   07/19/19 1300  ceFAZolin (ANCEF) IVPB 1 g/50 mL premix     1 g 100 mL/hr over 30 Minutes Intravenous Every 6 hours 07/19/19 1016 07/19/19 1852   07/19/19 0600  ceFAZolin (ANCEF) IVPB 2g/100 mL premix     2 g 200 mL/hr over 30 Minutes Intravenous On call to O.R. 07/19/19 0533 07/19/19 0724       Patient was given sequential compression devices, early ambulation, and chemoprophylaxis to prevent DVT.  Patient benefited maximally from hospital stay and there were no complications.    Recent vital signs:  Patient Vitals for the past 24 hrs:  BP Temp Temp src Pulse Resp SpO2  07/20/19 1001 122/64 98.8 F (37.1 C) Oral 68 16 95 %  07/20/19 0444 120/65 98.2 F (36.8 C) Oral 68 16 97 %  07/20/19 0105 (!) 112/57 98.6 F (37 C) Oral 68 16 94 %  07/19/19 2116 119/73 98.1 F (36.7 C) Oral 60 16  98 %  07/19/19 1313 114/69 97.7 F (36.5 C) Oral 63 16 96 %  07/19/19 1220 125/68 (!) 97.5 F (36.4 C) Oral (!) 56 17 96 %  07/19/19 1119 114/66 (!) 97.4 F (36.3 C) Oral (!) 57 16 98 %     Recent laboratory studies:  Recent Labs    07/20/19 0300  WBC 14.4*  HGB 11.8*  HCT 36.4*  PLT 178  NA 135  K 4.5  CL 102  CO2 25  BUN 21  CREATININE 0.67  GLUCOSE 140*  CALCIUM 8.6*     Discharge Medications:   Allergies as of 07/20/2019   No Known Allergies     Medication List    STOP taking these medications   nabumetone 750 MG tablet Commonly known as: RELAFEN     TAKE these medications   aspirin 81 MG chewable tablet Chew 1 tablet (81 mg total) by mouth 2 (two) times daily.   benazepril-hydrochlorthiazide 20-12.5 MG tablet Commonly known as: LOTENSIN HCT Take 1 tablet by mouth daily.   cholecalciferol 25 MCG (1000 UT) tablet Commonly known as: VITAMIN D Take 1,000 Units by mouth daily.   CoQ10 100 MG Caps Take 100 mg by mouth daily.   Fish Oil 1000 MG Caps Take 1,000 mg by mouth daily.   fluticasone 50 MCG/ACT nasal spray Commonly known as: FLONASE Place 1-2 sprays into both nostrils daily as needed for allergies.   gabapentin 300 MG capsule Commonly known as: NEURONTIN  TAKE 1 CAPSULE BY MOUTH AT NIGHT FOR 7 NIGHTS AND THEN 1 IN THE MORNING AND ONE AT NIGHT FOR 7 DAYS AND THEN 3 TIMES A DAY. What changed: See the new instructions.   Glucosamine 750 MG Tabs Take 750 mg by mouth 2 (two) times daily.   ibuprofen 200 MG tablet Commonly known as: ADVIL Take 400-600 mg by mouth every 8 (eight) hours as needed (pain.).   magnesium oxide 400 MG tablet Commonly known as: MAG-OX Take 400 mg by mouth daily.   methocarbamol 500 MG tablet Commonly known as: ROBAXIN Take 1 tablet (500 mg total) by mouth every 6 (six) hours as needed for muscle spasms.   oxyCODONE 5 MG immediate release tablet Commonly known as: Oxy IR/ROXICODONE Take 1-2 tablets (5-10 mg  total) by mouth every 4 (four) hours as needed for moderate pain (pain score 4-6).   simvastatin 80 MG tablet Commonly known as: ZOCOR Take 40 mg by mouth daily.   vitamin C 500 MG tablet Commonly known as: ASCORBIC ACID Take 500 mg by mouth daily.            Durable Medical Equipment  (From admission, onward)         Start     Ordered   07/19/19 1017  DME 3 n 1  Once     07/19/19 1016   07/19/19 1017  DME Walker rolling  Once    Question:  Patient needs a walker to treat with the following condition  Answer:  Status post total replacement of right hip   07/19/19 1016          Diagnostic Studies: Dg Pelvis Portable  Result Date: 07/19/2019 CLINICAL DATA:  Status post right total hip replacement. EXAM: PORTABLE PELVIS 1-2 VIEWS COMPARISON:  C-arm images obtained earlier today. FINDINGS: Right total hip prosthesis in satisfactory position and alignment. No fracture or dislocation seen. IMPRESSION: Satisfactory postoperative appearance of a right total hip prosthesis. Electronically Signed   By: Beckie SaltsSteven  Reid M.D.   On: 07/19/2019 09:54   Dg C-arm 1-60 Min-no Report  Result Date: 07/19/2019 Fluoroscopy was utilized by the requesting physician.  No radiographic interpretation.   Dg Or C-arm Image Only  Result Date: 07/19/2019 There is no interpretation for this exam.  This order is for images obtained during a surgical procedure.  Please See "Surgeries" Tab for more information regarding the procedure.   Dg Or C-arm Image Only  Result Date: 07/19/2019 There is no interpretation for this exam.  This order is for images obtained during a surgical procedure.  Please See "Surgeries" Tab for more information regarding the procedure.   Dg Hip Operative Unilat W Or W/o Pelvis Right  Result Date: 07/19/2019 CLINICAL DATA:  RIGHT hip replacement EXAM: OPERATIVE RIGHT HIP (WITH PELVIS IF PERFORMED) 3 VIEWS; fluoroscopy 1-60 minutes; fluoroscopy 1-60 minutes TECHNIQUE: Fluoroscopic  spot image(s) were submitted for interpretation post-operatively. COMPARISON:  02/13/2019 FLUOROSCOPY TIME:  0 minutes 15 seconds Dose: 3.36 mGy FINDINGS: Osseous demineralization. Components of a RIGHT hip prosthesis are identified in expected position. No acute fracture, dislocation, or bone destruction. IMPRESSION: RIGHT hip prosthesis without acute complication. Electronically Signed   By: Ulyses SouthwardMark  Boles M.D.   On: 07/19/2019 09:51    Disposition: Discharge disposition: 01-Home or Self Care       Discharge Instructions    Call MD / Call 911   Complete by: As directed    If you experience chest pain or shortness of breath, CALL 911  and be transported to the hospital emergency room.  If you develope a fever above 101 F, pus (white drainage) or increased drainage or redness at the wound, or calf pain, call your surgeon's office.   Constipation Prevention   Complete by: As directed    Drink plenty of fluids.  Prune juice may be helpful.  You may use a stool softener, such as Colace (over the counter) 100 mg twice a day.  Use MiraLax (over the counter) for constipation as needed.   Diet - low sodium heart healthy   Complete by: As directed    Increase activity slowly as tolerated   Complete by: As directed       Follow-up Information    Kathryne Hitch, MD Follow up in 2 week(s).   Specialty: Orthopedic Surgery Why: Our new address for [facility] is [address] This is right next door to our old Designer, jewellery information: [address] [place] [address] [phone]            Signed: Kathryne Hitch 07/20/2019, 10:49 AM

## 2019-07-20 NOTE — Progress Notes (Signed)
Physical Therapy Treatment Patient Details Name: Ricardo Stewart MRN: [id number] DOB: [birth year] Today's Date: 07/20/2019    History of Present Illness Patient is 73 y.o. male s/p Rt THA anterior approach on 07/19/19 with PMH significant for peripheral neuropathy, OA, HTN.    PT Comments    Progressing well with mobility. Reviewed/practiced exercises, gait training, and stair training. All education completed. Okay to d/c from PT standpoint.    Follow Up Recommendations  Follow surgeon's recommendation for DC plan and follow-up therapies     Equipment Recommendations  Rolling walker with 5" wheels    Recommendations for Other Services       Precautions / Restrictions Precautions Precautions: Fall Restrictions Weight Bearing Restrictions: No Other Position/Activity Restrictions: WBAT    Mobility  Bed Mobility Overal bed mobility: Needs Assistance Bed Mobility: Supine to Sit     Supine to sit: Supervision;HOB elevated     General bed mobility comments: for safety. increased time.  Transfers Overall transfer level: Needs assistance Equipment used: Rolling walker (2 wheeled) Transfers: Sit to/from Stand Sit to Stand: Min guard         General transfer comment: VCs safety, hand placement. Close guard for safety  Ambulation/Gait Ambulation/Gait assistance: Min guard Gait Distance (Feet): 150 Feet Assistive device: Rolling walker (2 wheeled) Gait Pattern/deviations: Step-to pattern;Step-through pattern;Decreased stride length     General Gait Details: close guard for safety. slow gait speed.   Stairs Stairs: Yes Stairs assistance: Min guard Stair Management: Step to pattern;Forwards;With walker Number of Stairs: 1 General stair comments: VCs safety, technique, sequence. Close guard for safety.   Wheelchair Mobility    Modified Rankin (Stroke Patients Only)       Balance Overall balance assessment: Needs assistance         Standing balance  support: Bilateral upper extremity supported Standing balance-Leahy Scale: Poor                              Cognition Arousal/Alertness: Awake/alert Behavior During Therapy: WFL for tasks assessed/performed Overall Cognitive Status: Within Functional Limits for tasks assessed                                        Exercises Total Joint Exercises Ankle Circles/Pumps: AROM;Both;10 reps;Supine Quad Sets: AROM;Both;10 reps;Supine Heel Slides: AAROM;10 reps;Right;Supine Hip ABduction/ADduction: AAROM;Right;10 reps;Supine    General Comments        Pertinent Vitals/Pain Pain Assessment: 0-10 Pain Score: 5  Pain Location: R hip/thigh Pain Descriptors / Indicators: Sore;Discomfort Pain Intervention(s): Ice applied;Monitored during session    Home Living                      Prior Function            PT Goals (current goals can now be found in the care plan section) Progress towards PT goals: Progressing toward goals    Frequency    7X/week      PT Plan Current plan remains appropriate    Co-evaluation              AM-PAC PT "6 Clicks" Mobility   Outcome Measure  Help needed turning from your back to your side while in a flat bed without using bedrails?: A Little Help needed moving from lying on your back to sitting on the side of a flat  bed without using bedrails?: A Little Help needed moving to and from a bed to a chair (including a wheelchair)?: A Little Help needed standing up from a chair using your arms (e.g., wheelchair or bedside chair)?: A Little Help needed to walk in hospital room?: A Little Help needed climbing 3-5 steps with a railing? : A Little 6 Click Score: 18    End of Session Equipment Utilized During Treatment: Gait belt Activity Tolerance: Patient tolerated treatment well Patient left: in chair;with call bell/phone within reach   PT Visit Diagnosis: Muscle weakness (generalized)  (M62.81);Difficulty in walking, not elsewhere classified (R26.2)     Time: 9937-1696 PT Time Calculation (min) (ACUTE ONLY): 21 min  Charges:  $Gait Training: 8-22 mins                        Rebeca Alert, PT Acute Rehabilitation Services Pager: [phone] Office: [phone]

## 2019-07-20 NOTE — Progress Notes (Signed)
    Home health agencies that serve [phone].        [place] Quality of Patient Care Rating Patient Survey Summary Rating  [facility] [phone] 2  out of 5 stars 4 out of [place] [phone] 4  out of 5 stars 5 out of [place] [phone] 4  out of 5 stars 4 out of [place] [phone] 4  out of 5 stars 5 out of [place] [phone] 4 out of 5 stars 4 out of [place] [phone] 4 out of 5 stars 4 out of 5 stars  [facility] [place] [phone] 3  out of 5 stars 4 out of [place] [phone] 3 out of 5 stars 4 out of [place] [phone] 3 out of 5 stars Not Available9  [facility] [phone] 4  out of 5 stars 3 out of [place] [phone] 3  out of 5 stars 4 out of [place] [phone] 3  out of 5 stars 4 out of [place] [phone]  out of 5 stars Not [place] [phone] 4  out of 5 stars 4 out of [place] number Footnote as displayed on [place]  1 This agency provides services under a federal waiver program to non-traditional, chronic long term population.  2 This agency provides services to a special needs population.  3 Not Available.  4 The number of patient episodes for this measure is too small to report.  5 This measure currently does not have data or provider has been certified/recertified for less than 6 months.  6 The national average for this measure is not provided because of state-to-state differences in data collection.  7 Medicare is not displaying  rates for this measure for any home health agency, because of an issue with the data.  8 There were problems with the data and they are being corrected.  9 Zero, or very few, patients met the survey's rules for inclusion. The scores shown, if any, reflect a very small number of surveys and may not accurately tell how an agency is doing.  10 Survey results are based on less than 12 months of data.  11 Fewer than 70 patients completed the survey. Use the scores shown, if any, with caution as the number of surveys may be too low to accurately tell how an agency is doing.  12 No survey results are available for this period.  13 Data suppressed by CMS for one or more quarters.

## 2019-07-20 NOTE — Progress Notes (Signed)
RN reviewed discharge instructions with patient and wife.   All questions answered.   Paperwork and prescriptions given.     RN rolled patient down with all belongings to family car.

## 2019-07-20 NOTE — TOC Initial Note (Signed)
Transition of Care [facility]) - Initial/Assessment Note    Patient Details  Name: Ricardo Stewart MRN: [id number] Date of Birth: 04-23-1946  Transition of Care [facility] CM/SW Contact:    Armanda Heritage, RN Phone Number: 07/20/2019, 10:56 AM  Clinical Narrative:   CM spoke with patient at bedside. Patient set up with [facility] for HHPT. [employer] to deliver rolling walker to bedside for home use, declines 3-in-1.                  Expected Discharge Plan: Home w Home Health Services Barriers to Discharge: No Barriers Identified   Patient Goals and CMS Choice Patient states their goals for this hospitalization and ongoing recovery are:: to go home today CMS Medicare.gov Compare Post Acute Care list provided to:: Patient Choice offered to / list presented to : Patient  Expected Discharge Plan and Services Expected Discharge Plan: Home w Home Health Services   Discharge Planning Services: CM Consult Post Acute Care Choice: Home Health Living arrangements for the past 2 months: Single Family Home Expected Discharge Date: 07/20/19               DME Arranged: Dan Humphreys rolling DME Agency: [facility] Date DME Agency Contacted: 07/20/19 Time DME Agency Contacted: 1055 Representative spoke with at DME Agency: [place] HH Arranged: PT HH Agency: [facility] Date HH Agency Contacted: 07/20/19 Time HH Agency Contacted: 1055 Representative spoke with at [facility] Agency: pre arranged in MD office  Prior Living Arrangements/Services Living arrangements for the past 2 months: Single Family Home Lives with:: Spouse Patient language and need for interpreter reviewed:: Yes Do you feel safe going back to the place where you live?: Yes      Need for Family Participation in Patient Care: Yes (Comment) Care giver support system in place?: Yes (comment)   Criminal Activity/Legal Involvement Pertinent to Current Situation/Hospitalization: No - Comment as  needed  Activities of Daily Living Home Assistive Devices/Equipment: Eyeglasses, Raised toilet seat with rails ADL Screening (condition at time of admission) Patient's cognitive ability adequate to safely complete daily activities?: Yes Is the patient deaf or have difficulty hearing?: No Does the patient have difficulty seeing, even when wearing glasses/contacts?: No Does the patient have difficulty concentrating, remembering, or making decisions?: No Patient able to express need for assistance with ADLs?: Yes Does the patient have difficulty dressing or bathing?: No Independently performs ADLs?: Yes (appropriate for developmental age) Does the patient have difficulty walking or climbing stairs?: No Weakness of Legs: None Weakness of Arms/Hands: None  Permission Sought/Granted   Permission granted to share information with : Yes, Verbal Permission Granted     Permission granted to share info w AGENCY: [facility]        Emotional Assessment Appearance:: Appears stated age Attitude/Demeanor/Rapport: Engaged Affect (typically observed): Accepting Orientation: : Oriented to Self, Oriented to Place, Oriented to  Time, Oriented to Situation   Psych Involvement: No (comment)  Admission diagnosis:  osteoarthritis right hip Patient Active Problem List   Diagnosis Date Noted  . 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 Unilateral primary osteoarthritis, right hip 06/17/2019  . Lumbar radiculopathy 09/24/2018  . Foraminal stenosis of lumbar region 09/24/2018  . Spondylosis without myelopathy or radiculopathy, lumbar region 08/29/2016  . Chronic left-sided low back pain without sciatica 08/29/2016   PCP:  Sigmund Hazel, MD Pharmacy:   [phone] [place], [place] - [address] [place] [address] Phone:  [phone] Fax: [phone]     Social Determinants of Health (SDOH) Interventions    Readmission Risk Interventions No  flowsheet data found.

## 2019-07-22 ENCOUNTER — Encounter (HOSPITAL_COMMUNITY): Payer: Self-pay | Admitting: Orthopaedic Surgery

## 2019-07-22 ENCOUNTER — Telehealth: Payer: Self-pay | Admitting: Orthopaedic Surgery

## 2019-07-22 DIAGNOSIS — Z7982 Long term (current) use of aspirin: Secondary | ICD-10-CM | POA: Diagnosis not present

## 2019-07-22 DIAGNOSIS — Z471 Aftercare following joint replacement surgery: Secondary | ICD-10-CM | POA: Diagnosis not present

## 2019-07-22 DIAGNOSIS — M4726 Other spondylosis with radiculopathy, lumbar region: Secondary | ICD-10-CM | POA: Diagnosis not present

## 2019-07-22 DIAGNOSIS — I10 Essential (primary) hypertension: Secondary | ICD-10-CM | POA: Diagnosis not present

## 2019-07-22 DIAGNOSIS — Z96641 Presence of right artificial hip joint: Secondary | ICD-10-CM | POA: Diagnosis not present

## 2019-07-22 DIAGNOSIS — M48061 Spinal stenosis, lumbar region without neurogenic claudication: Secondary | ICD-10-CM | POA: Diagnosis not present

## 2019-07-22 DIAGNOSIS — M1612 Unilateral primary osteoarthritis, left hip: Secondary | ICD-10-CM | POA: Diagnosis not present

## 2019-07-22 DIAGNOSIS — G8929 Other chronic pain: Secondary | ICD-10-CM | POA: Diagnosis not present

## 2019-07-22 DIAGNOSIS — G629 Polyneuropathy, unspecified: Secondary | ICD-10-CM | POA: Diagnosis not present

## 2019-07-22 NOTE — Telephone Encounter (Signed)
Received voicemail from Rosa Sanchez (PT) with [facility] needing verbal orders for HHPT 3 Wk 2. The number to contact Annabelle Harman is [phone]

## 2019-07-22 NOTE — Telephone Encounter (Signed)
Verbal order given  

## 2019-07-22 NOTE — Telephone Encounter (Signed)
Please advise 

## 2019-07-23 ENCOUNTER — Telehealth: Payer: Self-pay | Admitting: Orthopaedic Surgery

## 2019-07-23 MED ORDER — OXYCODONE HCL 5 MG PO TABS: 5.0000 mg | ORAL_TABLET | ORAL | 0 refills | Status: DC | PRN

## 2019-07-23 NOTE — Telephone Encounter (Signed)
I sent some more in. 

## 2019-07-23 NOTE — Telephone Encounter (Signed)
Pt states he will run out of his pain medication today and that in order for [facility] to refill that stated they needed a call from Dr [name]. Pt request a call back with the status.

## 2019-07-23 NOTE — Telephone Encounter (Signed)
Pt aware this was sent in this morning

## 2019-07-23 NOTE — Telephone Encounter (Signed)
Please advise  May need to write it different  He's taking 2 every 4 hours

## 2019-07-23 NOTE — Telephone Encounter (Signed)
Patient's wife called stating that he is doing great and has been taking 1 pain pill to stretch the medication out.  She is wanting to know if Dr. [name] would refill his pain medication or what can she give him if he were to run out of the pain medication before getting the refill.  CB#[phone].  Thank you.

## 2019-07-24 DIAGNOSIS — I10 Essential (primary) hypertension: Secondary | ICD-10-CM | POA: Diagnosis not present

## 2019-07-24 DIAGNOSIS — Z471 Aftercare following joint replacement surgery: Secondary | ICD-10-CM | POA: Diagnosis not present

## 2019-07-24 DIAGNOSIS — Z7982 Long term (current) use of aspirin: Secondary | ICD-10-CM | POA: Diagnosis not present

## 2019-07-24 DIAGNOSIS — G629 Polyneuropathy, unspecified: Secondary | ICD-10-CM | POA: Diagnosis not present

## 2019-07-24 DIAGNOSIS — M4726 Other spondylosis with radiculopathy, lumbar region: Secondary | ICD-10-CM | POA: Diagnosis not present

## 2019-07-24 DIAGNOSIS — Z96641 Presence of right artificial hip joint: Secondary | ICD-10-CM | POA: Diagnosis not present

## 2019-07-24 DIAGNOSIS — M1612 Unilateral primary osteoarthritis, left hip: Secondary | ICD-10-CM | POA: Diagnosis not present

## 2019-07-24 DIAGNOSIS — G8929 Other chronic pain: Secondary | ICD-10-CM | POA: Diagnosis not present

## 2019-07-24 DIAGNOSIS — M48061 Spinal stenosis, lumbar region without neurogenic claudication: Secondary | ICD-10-CM | POA: Diagnosis not present

## 2019-07-26 DIAGNOSIS — Z471 Aftercare following joint replacement surgery: Secondary | ICD-10-CM | POA: Diagnosis not present

## 2019-07-26 DIAGNOSIS — M4726 Other spondylosis with radiculopathy, lumbar region: Secondary | ICD-10-CM | POA: Diagnosis not present

## 2019-07-26 DIAGNOSIS — G8929 Other chronic pain: Secondary | ICD-10-CM | POA: Diagnosis not present

## 2019-07-26 DIAGNOSIS — M1612 Unilateral primary osteoarthritis, left hip: Secondary | ICD-10-CM | POA: Diagnosis not present

## 2019-07-26 DIAGNOSIS — Z7982 Long term (current) use of aspirin: Secondary | ICD-10-CM | POA: Diagnosis not present

## 2019-07-26 DIAGNOSIS — I10 Essential (primary) hypertension: Secondary | ICD-10-CM | POA: Diagnosis not present

## 2019-07-26 DIAGNOSIS — G629 Polyneuropathy, unspecified: Secondary | ICD-10-CM | POA: Diagnosis not present

## 2019-07-26 DIAGNOSIS — M48061 Spinal stenosis, lumbar region without neurogenic claudication: Secondary | ICD-10-CM | POA: Diagnosis not present

## 2019-07-26 DIAGNOSIS — Z96641 Presence of right artificial hip joint: Secondary | ICD-10-CM | POA: Diagnosis not present

## 2019-07-27 ENCOUNTER — Other Ambulatory Visit: Payer: Self-pay | Admitting: Orthopaedic Surgery

## 2019-07-29 DIAGNOSIS — M48061 Spinal stenosis, lumbar region without neurogenic claudication: Secondary | ICD-10-CM | POA: Diagnosis not present

## 2019-07-29 DIAGNOSIS — Z96641 Presence of right artificial hip joint: Secondary | ICD-10-CM | POA: Diagnosis not present

## 2019-07-29 DIAGNOSIS — I10 Essential (primary) hypertension: Secondary | ICD-10-CM | POA: Diagnosis not present

## 2019-07-29 DIAGNOSIS — G629 Polyneuropathy, unspecified: Secondary | ICD-10-CM | POA: Diagnosis not present

## 2019-07-29 DIAGNOSIS — M4726 Other spondylosis with radiculopathy, lumbar region: Secondary | ICD-10-CM | POA: Diagnosis not present

## 2019-07-29 DIAGNOSIS — Z471 Aftercare following joint replacement surgery: Secondary | ICD-10-CM | POA: Diagnosis not present

## 2019-07-29 DIAGNOSIS — G8929 Other chronic pain: Secondary | ICD-10-CM | POA: Diagnosis not present

## 2019-07-29 DIAGNOSIS — M1612 Unilateral primary osteoarthritis, left hip: Secondary | ICD-10-CM | POA: Diagnosis not present

## 2019-07-29 DIAGNOSIS — Z7982 Long term (current) use of aspirin: Secondary | ICD-10-CM | POA: Diagnosis not present

## 2019-07-29 NOTE — Telephone Encounter (Signed)
Just had total joint 07/19/19, continue?

## 2019-07-31 DIAGNOSIS — Z96641 Presence of right artificial hip joint: Secondary | ICD-10-CM | POA: Diagnosis not present

## 2019-07-31 DIAGNOSIS — G8929 Other chronic pain: Secondary | ICD-10-CM | POA: Diagnosis not present

## 2019-07-31 DIAGNOSIS — I10 Essential (primary) hypertension: Secondary | ICD-10-CM | POA: Diagnosis not present

## 2019-07-31 DIAGNOSIS — M4726 Other spondylosis with radiculopathy, lumbar region: Secondary | ICD-10-CM | POA: Diagnosis not present

## 2019-07-31 DIAGNOSIS — M1612 Unilateral primary osteoarthritis, left hip: Secondary | ICD-10-CM | POA: Diagnosis not present

## 2019-07-31 DIAGNOSIS — M48061 Spinal stenosis, lumbar region without neurogenic claudication: Secondary | ICD-10-CM | POA: Diagnosis not present

## 2019-07-31 DIAGNOSIS — G629 Polyneuropathy, unspecified: Secondary | ICD-10-CM | POA: Diagnosis not present

## 2019-07-31 DIAGNOSIS — Z7982 Long term (current) use of aspirin: Secondary | ICD-10-CM | POA: Diagnosis not present

## 2019-07-31 DIAGNOSIS — Z471 Aftercare following joint replacement surgery: Secondary | ICD-10-CM | POA: Diagnosis not present

## 2019-08-01 ENCOUNTER — Encounter: Payer: Self-pay | Admitting: Orthopaedic Surgery

## 2019-08-01 ENCOUNTER — Ambulatory Visit (INDEPENDENT_AMBULATORY_CARE_PROVIDER_SITE_OTHER): Payer: Medicare HMO | Admitting: Orthopaedic Surgery

## 2019-08-01 ENCOUNTER — Other Ambulatory Visit: Payer: Self-pay

## 2019-08-01 DIAGNOSIS — Z96641 Presence of right artificial hip joint: Secondary | ICD-10-CM

## 2019-08-01 NOTE — Progress Notes (Signed)
The patient will be 2 weeks tomorrow status post a right total hip arthroplasty.  He is doing well overall.  He is ambulating with a cane.  He has 1 more home health physical therapy visit tomorrow.  He has been on aspirin twice a day.  He understands that he will go down to once a day for a week and then can stop aspirin.  He is still wearing some TED hose because he is having some swelling in his right foot.  He feels like he is making great progress and is doing well overall.  Exam he easily to put his right operative hip the range of motion.  There is no blocks to motion no significant pain or discomfort.  His staples have been removed and Steri-Strips applied to the incision.  It looks good overall.  His leg lengths are equal.  He will continue to increase his activities as comfort allows.  All questions and concerns were addressed.  He can drive from my standpoint.  We will see him back in 4 weeks to see how he is doing overall but no x-rays are needed.

## 2019-08-02 DIAGNOSIS — M1612 Unilateral primary osteoarthritis, left hip: Secondary | ICD-10-CM | POA: Diagnosis not present

## 2019-08-02 DIAGNOSIS — M4726 Other spondylosis with radiculopathy, lumbar region: Secondary | ICD-10-CM | POA: Diagnosis not present

## 2019-08-02 DIAGNOSIS — I10 Essential (primary) hypertension: Secondary | ICD-10-CM | POA: Diagnosis not present

## 2019-08-02 DIAGNOSIS — Z7982 Long term (current) use of aspirin: Secondary | ICD-10-CM | POA: Diagnosis not present

## 2019-08-02 DIAGNOSIS — Z471 Aftercare following joint replacement surgery: Secondary | ICD-10-CM | POA: Diagnosis not present

## 2019-08-02 DIAGNOSIS — M48061 Spinal stenosis, lumbar region without neurogenic claudication: Secondary | ICD-10-CM | POA: Diagnosis not present

## 2019-08-02 DIAGNOSIS — G629 Polyneuropathy, unspecified: Secondary | ICD-10-CM | POA: Diagnosis not present

## 2019-08-02 DIAGNOSIS — Z96641 Presence of right artificial hip joint: Secondary | ICD-10-CM | POA: Diagnosis not present

## 2019-08-02 DIAGNOSIS — G8929 Other chronic pain: Secondary | ICD-10-CM | POA: Diagnosis not present

## 2019-08-29 ENCOUNTER — Ambulatory Visit: Payer: Medicare HMO | Admitting: Orthopaedic Surgery

## 2019-09-10 ENCOUNTER — Ambulatory Visit: Payer: Medicare HMO | Admitting: Orthopaedic Surgery

## 2019-09-18 ENCOUNTER — Ambulatory Visit (INDEPENDENT_AMBULATORY_CARE_PROVIDER_SITE_OTHER): Payer: Medicare HMO | Admitting: Orthopaedic Surgery

## 2019-09-18 ENCOUNTER — Other Ambulatory Visit: Payer: Self-pay

## 2019-09-18 ENCOUNTER — Encounter: Payer: Self-pay | Admitting: Orthopaedic Surgery

## 2019-09-18 VITALS — Ht 68.0 in | Wt 227.0 lb

## 2019-09-18 DIAGNOSIS — Z96641 Presence of right artificial hip joint: Secondary | ICD-10-CM

## 2019-09-18 MED ORDER — METHYLPREDNISOLONE 4 MG PO TABS: ORAL_TABLET | ORAL | 0 refills | Status: DC

## 2019-09-18 NOTE — Progress Notes (Signed)
The patient is now almost 9 weeks status post a right total hip arthroplasty.  He said is doing very well.  He is having some left hip pain now however it seems to be more in the sciatic region.  He last had an epidural steroid injection to the left side by Dr. Alvester Morin in December 2019 and then those symptoms resolved.  His previous MRI from May 2019 did show significant disc protrusion to the left side at L5-S1.  He denies any groin pain on the left side.  He says right total hip is doing well.  He does walk with a slight Trendelenburg gait but is favoring more to the left side.  His right hip moves smoothly.  The left hip actually moves smoothly as well.  Previous plain films of the pelvis showed a normal-appearing left hip.  I feel that he is experiencing more less sciatica on the left side.  He may end up benefiting from another epidural steroid injection at L5-S1 by Dr. Alvester Morin right now organ to try just time and a one-time steroid taper.  From my standpoint I do not need to see him back for 6 months.  At that visit I like a standing low AP pelvis and lateral of his right operative hip.  If the sciatica symptoms do not improve he will call us to schedule an L5-S1 injection to the left side by Dr. Alvester Morin.  All question concerns were answered and addressed otherwise.

## 2019-11-04 DIAGNOSIS — M545 Low back pain: Secondary | ICD-10-CM | POA: Diagnosis not present

## 2019-11-04 DIAGNOSIS — E7800 Pure hypercholesterolemia, unspecified: Secondary | ICD-10-CM | POA: Diagnosis not present

## 2019-11-04 DIAGNOSIS — I10 Essential (primary) hypertension: Secondary | ICD-10-CM | POA: Diagnosis not present

## 2019-11-04 DIAGNOSIS — J302 Other seasonal allergic rhinitis: Secondary | ICD-10-CM | POA: Diagnosis not present

## 2019-11-04 DIAGNOSIS — Z0000 Encounter for general adult medical examination without abnormal findings: Secondary | ICD-10-CM | POA: Diagnosis not present

## 2019-11-04 DIAGNOSIS — Z125 Encounter for screening for malignant neoplasm of prostate: Secondary | ICD-10-CM | POA: Diagnosis not present

## 2019-11-04 DIAGNOSIS — Z6836 Body mass index (BMI) 36.0-36.9, adult: Secondary | ICD-10-CM | POA: Diagnosis not present

## 2019-12-02 DIAGNOSIS — I10 Essential (primary) hypertension: Secondary | ICD-10-CM | POA: Diagnosis not present

## 2019-12-02 DIAGNOSIS — E7800 Pure hypercholesterolemia, unspecified: Secondary | ICD-10-CM | POA: Diagnosis not present

## 2019-12-16 DIAGNOSIS — H353131 Nonexudative age-related macular degeneration, bilateral, early dry stage: Secondary | ICD-10-CM | POA: Diagnosis not present

## 2019-12-16 DIAGNOSIS — H524 Presbyopia: Secondary | ICD-10-CM | POA: Diagnosis not present

## 2020-01-17 DIAGNOSIS — E7800 Pure hypercholesterolemia, unspecified: Secondary | ICD-10-CM | POA: Diagnosis not present

## 2020-01-17 DIAGNOSIS — I10 Essential (primary) hypertension: Secondary | ICD-10-CM | POA: Diagnosis not present

## 2020-02-27 DIAGNOSIS — E7800 Pure hypercholesterolemia, unspecified: Secondary | ICD-10-CM | POA: Diagnosis not present

## 2020-02-27 DIAGNOSIS — I10 Essential (primary) hypertension: Secondary | ICD-10-CM | POA: Diagnosis not present

## 2020-03-17 ENCOUNTER — Ambulatory Visit: Payer: Medicare HMO | Admitting: Orthopaedic Surgery

## 2020-03-17 ENCOUNTER — Ambulatory Visit (INDEPENDENT_AMBULATORY_CARE_PROVIDER_SITE_OTHER): Payer: Medicare HMO

## 2020-03-17 ENCOUNTER — Encounter: Payer: Self-pay | Admitting: Orthopaedic Surgery

## 2020-03-17 DIAGNOSIS — Z96641 Presence of right artificial hip joint: Secondary | ICD-10-CM

## 2020-03-17 NOTE — Progress Notes (Signed)
HPI: [name] returns now 6 months status post right total hip arthroplasty.  He is overall doing well.  He has some numbness about the incision but otherwise has no complaints.  He is having some pain lateral aspect of his left hip noted.  No groin pain on the left.   Review of systems: See HPI otherwise negative or noncontributory.  Physical exam: General well-developed well-nourished male in ambulates without an antalgic gait or assistive device. Right hip full range of motion without pain.  Mild tenderness over the trochanteric region of both hips left greater than right.  Left hip full range of motion.  Right calf supple nontender.  Dorsiflexion plantarflexion right ankle intact.  Radiographs: AP pelvis lateral view of the right hip shows well-seated right total hip arthroplasty components without any complicating features.  Left hip well-preserved.  No acute fractures or bony abnormalities.  Impression: 6 months status post right total hip arthroplasty  Plan: See him back in 6 months and repeat the low pelvis at follow-up.  Questions were encouraged and answered.  IT band stretching exercises shown.

## 2020-04-16 DIAGNOSIS — E7800 Pure hypercholesterolemia, unspecified: Secondary | ICD-10-CM | POA: Diagnosis not present

## 2020-04-16 DIAGNOSIS — I10 Essential (primary) hypertension: Secondary | ICD-10-CM | POA: Diagnosis not present

## 2020-05-04 DIAGNOSIS — N529 Male erectile dysfunction, unspecified: Secondary | ICD-10-CM | POA: Diagnosis not present

## 2020-05-04 DIAGNOSIS — E7800 Pure hypercholesterolemia, unspecified: Secondary | ICD-10-CM | POA: Diagnosis not present

## 2020-05-04 DIAGNOSIS — E669 Obesity, unspecified: Secondary | ICD-10-CM | POA: Diagnosis not present

## 2020-05-04 DIAGNOSIS — M545 Low back pain: Secondary | ICD-10-CM | POA: Diagnosis not present

## 2020-05-04 DIAGNOSIS — Z6834 Body mass index (BMI) 34.0-34.9, adult: Secondary | ICD-10-CM | POA: Diagnosis not present

## 2020-05-04 DIAGNOSIS — I10 Essential (primary) hypertension: Secondary | ICD-10-CM | POA: Diagnosis not present

## 2020-05-04 DIAGNOSIS — J302 Other seasonal allergic rhinitis: Secondary | ICD-10-CM | POA: Diagnosis not present

## 2020-05-21 DIAGNOSIS — Z20822 Contact with and (suspected) exposure to covid-19: Secondary | ICD-10-CM | POA: Diagnosis not present

## 2020-06-21 DIAGNOSIS — I10 Essential (primary) hypertension: Secondary | ICD-10-CM | POA: Diagnosis not present

## 2020-06-21 DIAGNOSIS — E7800 Pure hypercholesterolemia, unspecified: Secondary | ICD-10-CM | POA: Diagnosis not present

## 2020-08-28 DIAGNOSIS — E7800 Pure hypercholesterolemia, unspecified: Secondary | ICD-10-CM | POA: Diagnosis not present

## 2020-08-28 DIAGNOSIS — I10 Essential (primary) hypertension: Secondary | ICD-10-CM | POA: Diagnosis not present

## 2020-09-17 ENCOUNTER — Other Ambulatory Visit: Payer: Self-pay

## 2020-09-17 ENCOUNTER — Encounter: Payer: Self-pay | Admitting: Orthopaedic Surgery

## 2020-09-17 ENCOUNTER — Ambulatory Visit (INDEPENDENT_AMBULATORY_CARE_PROVIDER_SITE_OTHER): Payer: Medicare HMO

## 2020-09-17 ENCOUNTER — Ambulatory Visit (INDEPENDENT_AMBULATORY_CARE_PROVIDER_SITE_OTHER): Payer: Medicare HMO | Admitting: Orthopaedic Surgery

## 2020-09-17 DIAGNOSIS — Z96641 Presence of right artificial hip joint: Secondary | ICD-10-CM

## 2020-09-17 NOTE — Progress Notes (Signed)
The patient is now almost 14 months status post a right total hip arthroplasty.  He is doing very well that hip and has no complaints.  He still has some ischial pain on his left opposite side.  Some of this is around the hip trochanteric area on the left side.  His right operative site has no complaints for him.  He said no other acute changes in medical status.  He denies any headache, chest pain, shortness of breath, fever, chills, nausea, vomiting.  Examination of his right operative hip shows it moves smoothly and fluidly with no difficulty at all.  His left hip also moves well which is some mild pain over the ischium and trochanteric area.  An AP pelvis and lateral of the right hip shows a well-seated total hip arthroplasty on the right side with no complicating features.  At this point follow-up will be as needed since he is doing well.  If he develops any problems of the right hip will let us know.  His previous MRI that did show left hip showed some arthritic changes in the left hip and if it becomes problematic for him or his pain over the trochanteric area of the ischial area or even sciatica becomes problematic, he will let us know.  All questions and concerns were answered and addressed.

## 2020-10-21 DIAGNOSIS — I10 Essential (primary) hypertension: Secondary | ICD-10-CM | POA: Diagnosis not present

## 2020-10-21 DIAGNOSIS — E7800 Pure hypercholesterolemia, unspecified: Secondary | ICD-10-CM | POA: Diagnosis not present

## 2020-11-06 IMAGING — RF DG HIP (WITH PELVIS) OPERATIVE*R*
1 series · 3 of 3 positions shown · non-contrast
Comparison: 02/13/2019

FLUOROSCOPY TIME:  0 minutes 15 seconds

Dose: 3.36 mGy

CLINICAL DATA: RIGHT hip replacement

EXAM:
OPERATIVE RIGHT HIP (WITH PELVIS IF PERFORMED) 3 VIEWS; fluoroscopy
1-60 minutes; fluoroscopy 1-60 minutes
TECHNIQUE: Fluoroscopic spot image(s) were submitted for interpretation
post-operatively.

[Series 1: unknown protocol · 0.20mm/px · 3 of 3 slices shown]
[im 1/3]
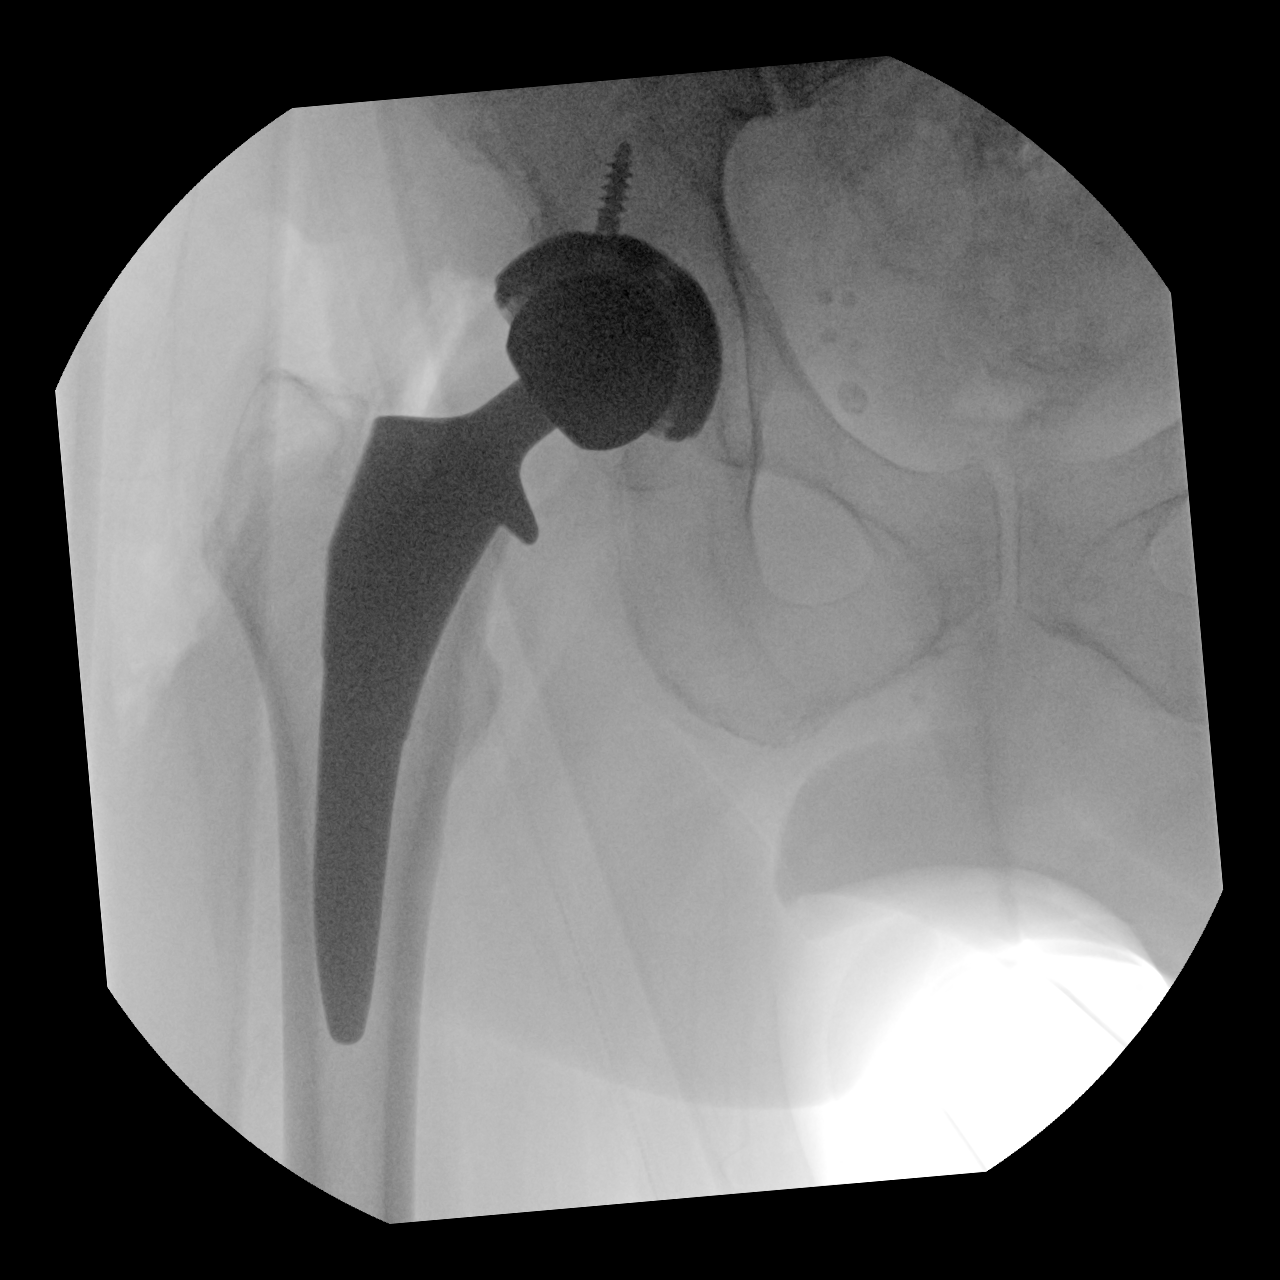
[im 2/3]
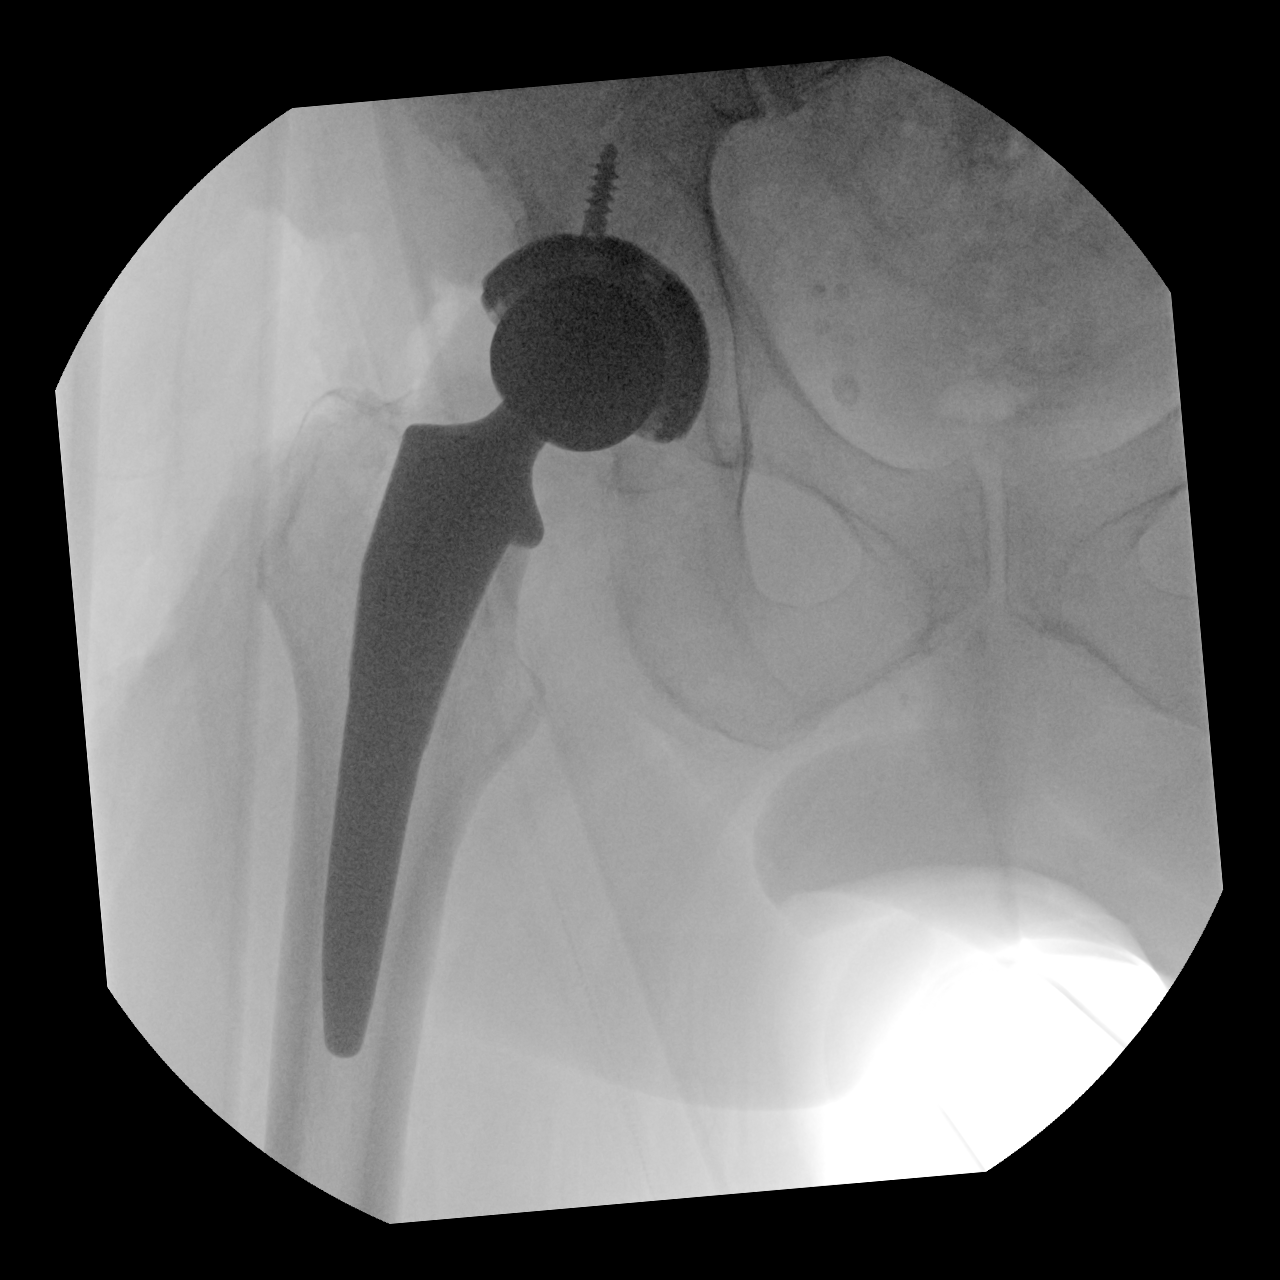
[im 3/3]
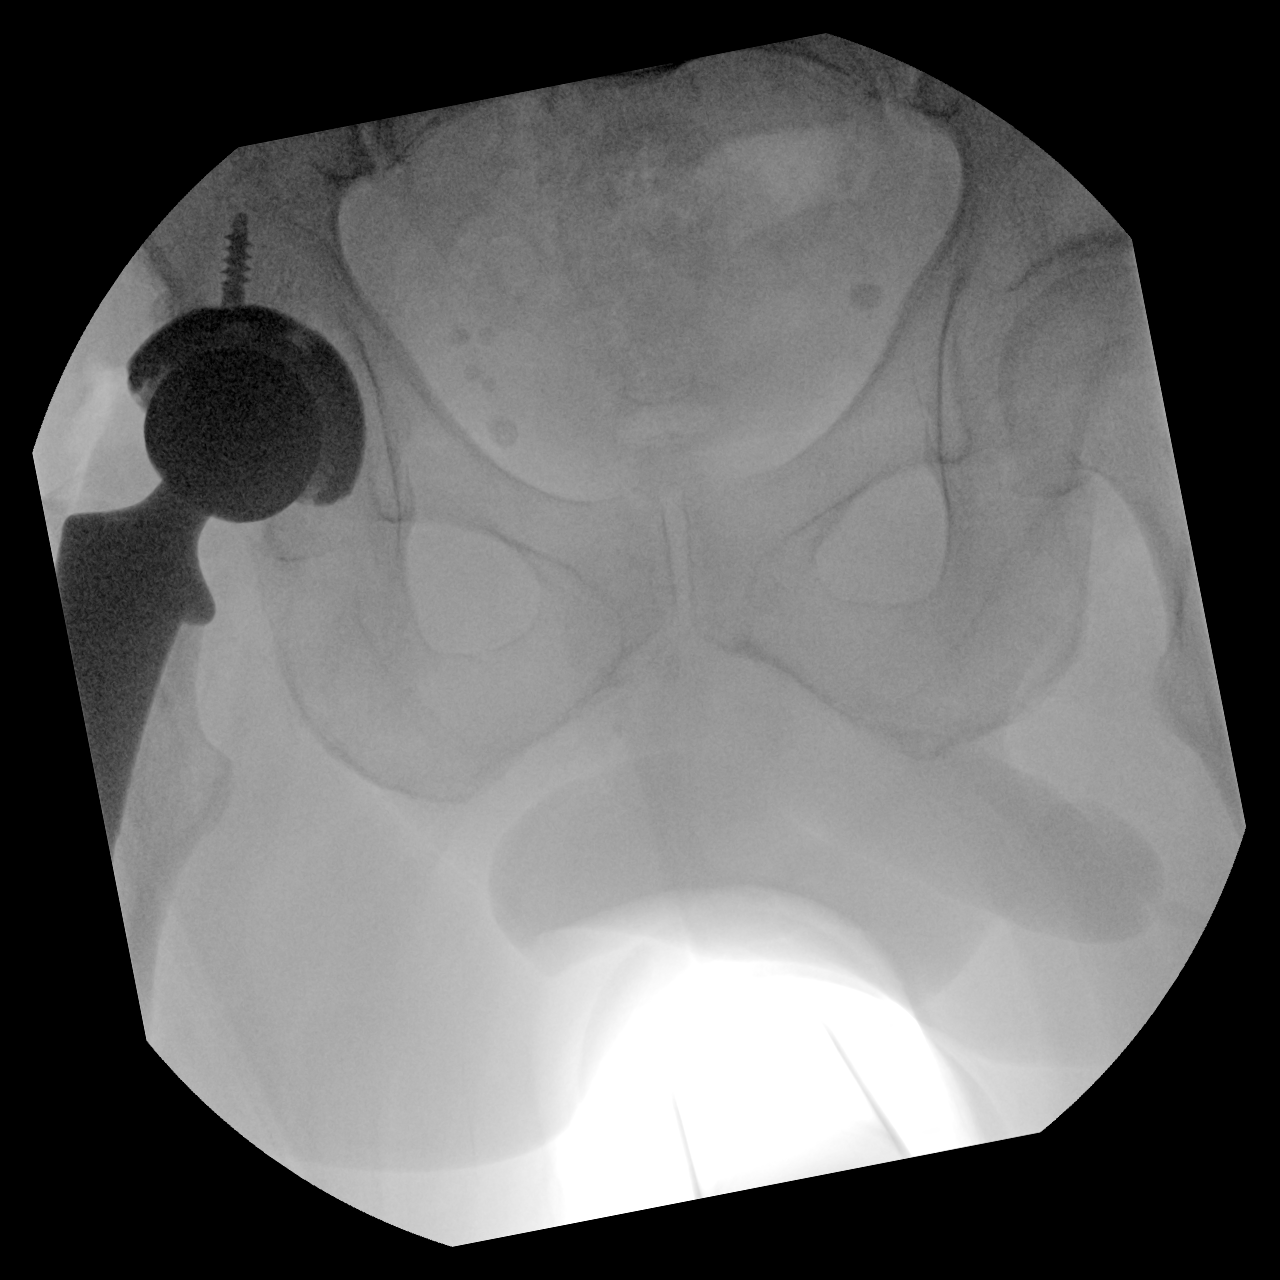

[3 of 3 positions shown; findings below may reference images not displayed]

FINDINGS: Osseous demineralization.

Components of a RIGHT hip prosthesis are identified in expected
position.

No acute fracture, dislocation, or bone destruction.
IMPRESSION: RIGHT hip prosthesis without acute complication.

## 2020-11-06 IMAGING — RF DG C-ARM 1-60 MIN-NO REPORT
1 series · 3 of 3 positions shown · non-contrast
Comparison: 02/13/2019

FLUOROSCOPY TIME:  0 minutes 15 seconds

Dose: 3.36 mGy

CLINICAL DATA: RIGHT hip replacement

EXAM:
OPERATIVE RIGHT HIP (WITH PELVIS IF PERFORMED) 3 VIEWS; fluoroscopy
1-60 minutes; fluoroscopy 1-60 minutes
TECHNIQUE: Fluoroscopic spot image(s) were submitted for interpretation
post-operatively.

[Series 1: unknown protocol · 0.20mm/px · 3 of 3 slices shown]
[im 1/3]
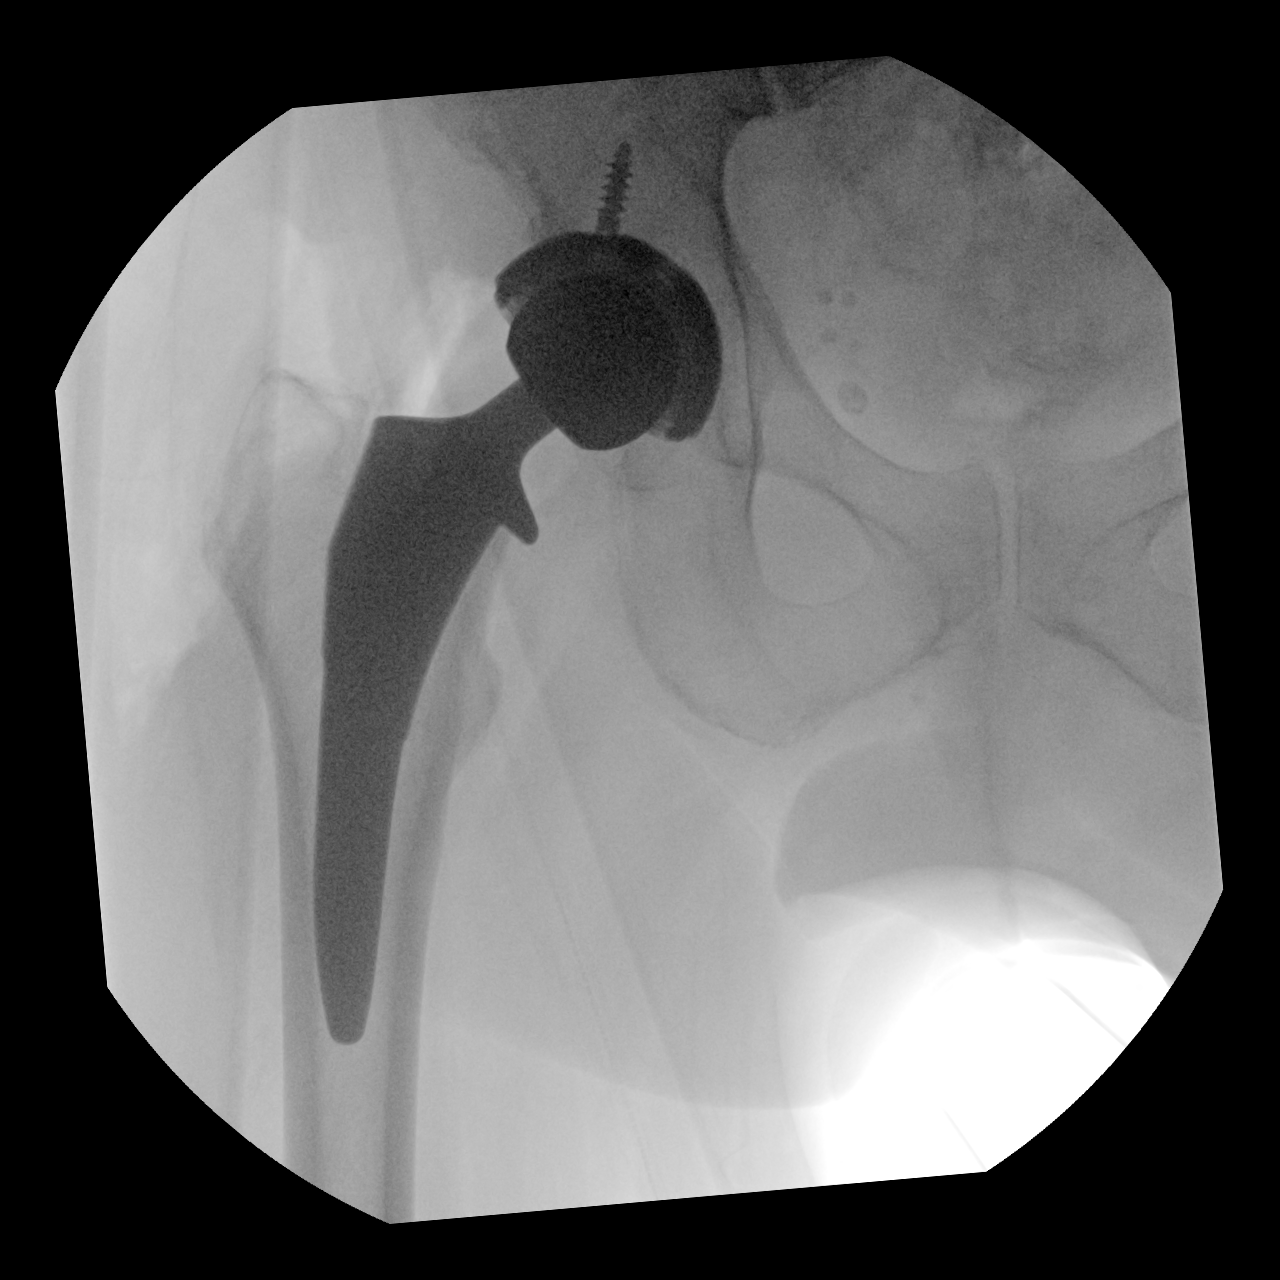
[im 2/3]
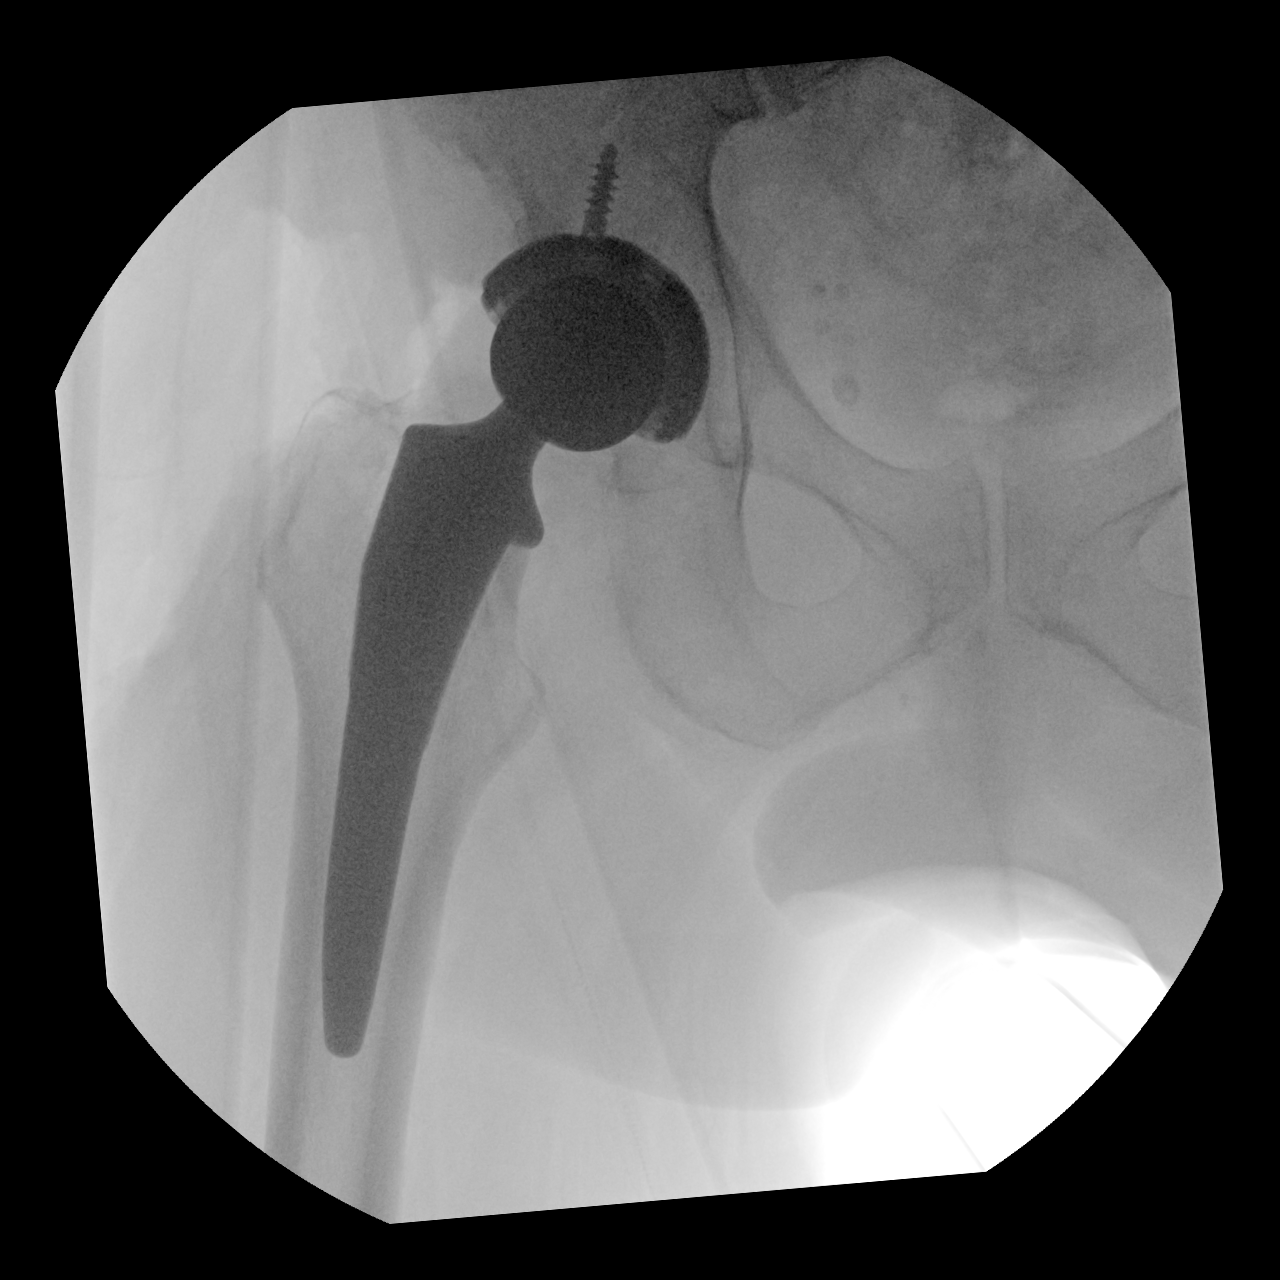
[im 3/3]
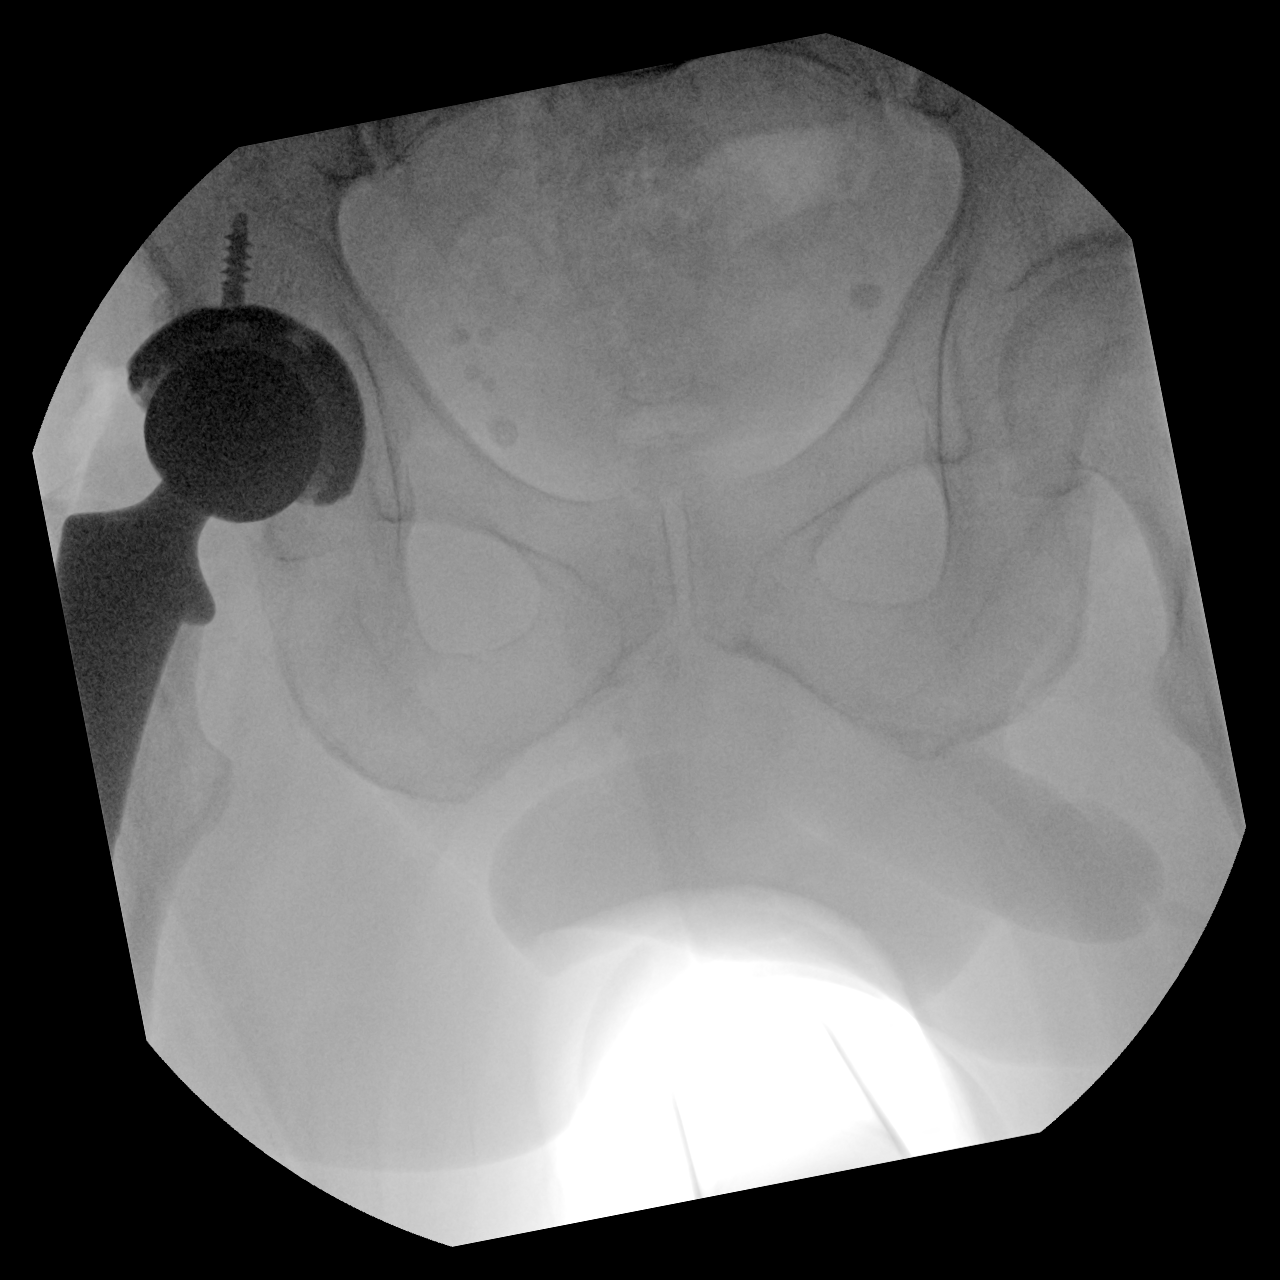

[3 of 3 positions shown; findings below may reference images not displayed]

FINDINGS: Osseous demineralization.

Components of a RIGHT hip prosthesis are identified in expected
position.

No acute fracture, dislocation, or bone destruction.
IMPRESSION: RIGHT hip prosthesis without acute complication.

## 2020-11-10 DIAGNOSIS — Z1389 Encounter for screening for other disorder: Secondary | ICD-10-CM | POA: Diagnosis not present

## 2020-11-10 DIAGNOSIS — Z0000 Encounter for general adult medical examination without abnormal findings: Secondary | ICD-10-CM | POA: Diagnosis not present

## 2021-02-12 DIAGNOSIS — E7800 Pure hypercholesterolemia, unspecified: Secondary | ICD-10-CM | POA: Diagnosis not present

## 2021-02-12 DIAGNOSIS — J302 Other seasonal allergic rhinitis: Secondary | ICD-10-CM | POA: Diagnosis not present

## 2021-02-12 DIAGNOSIS — M545 Low back pain, unspecified: Secondary | ICD-10-CM | POA: Diagnosis not present

## 2021-02-12 DIAGNOSIS — I10 Essential (primary) hypertension: Secondary | ICD-10-CM | POA: Diagnosis not present

## 2021-02-12 DIAGNOSIS — Z6835 Body mass index (BMI) 35.0-35.9, adult: Secondary | ICD-10-CM | POA: Diagnosis not present

## 2021-02-19 DIAGNOSIS — I10 Essential (primary) hypertension: Secondary | ICD-10-CM | POA: Diagnosis not present

## 2021-02-19 DIAGNOSIS — E7800 Pure hypercholesterolemia, unspecified: Secondary | ICD-10-CM | POA: Diagnosis not present

## 2021-02-19 DIAGNOSIS — E785 Hyperlipidemia, unspecified: Secondary | ICD-10-CM | POA: Diagnosis not present

## 2021-05-20 DIAGNOSIS — E7800 Pure hypercholesterolemia, unspecified: Secondary | ICD-10-CM | POA: Diagnosis not present

## 2021-05-20 DIAGNOSIS — I10 Essential (primary) hypertension: Secondary | ICD-10-CM | POA: Diagnosis not present

## 2021-05-20 DIAGNOSIS — E785 Hyperlipidemia, unspecified: Secondary | ICD-10-CM | POA: Diagnosis not present

## 2021-06-07 DIAGNOSIS — J302 Other seasonal allergic rhinitis: Secondary | ICD-10-CM | POA: Diagnosis not present

## 2021-06-07 DIAGNOSIS — J329 Chronic sinusitis, unspecified: Secondary | ICD-10-CM | POA: Diagnosis not present

## 2021-06-07 DIAGNOSIS — Z6835 Body mass index (BMI) 35.0-35.9, adult: Secondary | ICD-10-CM | POA: Diagnosis not present

## 2021-08-20 DIAGNOSIS — E7800 Pure hypercholesterolemia, unspecified: Secondary | ICD-10-CM | POA: Diagnosis not present

## 2021-08-20 DIAGNOSIS — I10 Essential (primary) hypertension: Secondary | ICD-10-CM | POA: Diagnosis not present

## 2021-08-20 DIAGNOSIS — E785 Hyperlipidemia, unspecified: Secondary | ICD-10-CM | POA: Diagnosis not present

## 2021-10-08 DIAGNOSIS — E785 Hyperlipidemia, unspecified: Secondary | ICD-10-CM | POA: Diagnosis not present

## 2021-10-08 DIAGNOSIS — Z125 Encounter for screening for malignant neoplasm of prostate: Secondary | ICD-10-CM | POA: Diagnosis not present

## 2021-10-08 DIAGNOSIS — I10 Essential (primary) hypertension: Secondary | ICD-10-CM | POA: Diagnosis not present

## 2021-10-08 DIAGNOSIS — M545 Low back pain, unspecified: Secondary | ICD-10-CM | POA: Diagnosis not present

## 2021-11-11 DIAGNOSIS — Z0000 Encounter for general adult medical examination without abnormal findings: Secondary | ICD-10-CM | POA: Diagnosis not present

## 2021-11-11 DIAGNOSIS — R972 Elevated prostate specific antigen [PSA]: Secondary | ICD-10-CM | POA: Diagnosis not present

## 2021-11-11 DIAGNOSIS — Z1389 Encounter for screening for other disorder: Secondary | ICD-10-CM | POA: Diagnosis not present

## 2022-04-04 DIAGNOSIS — I10 Essential (primary) hypertension: Secondary | ICD-10-CM | POA: Diagnosis not present

## 2022-04-04 DIAGNOSIS — N529 Male erectile dysfunction, unspecified: Secondary | ICD-10-CM | POA: Diagnosis not present

## 2022-04-04 DIAGNOSIS — E785 Hyperlipidemia, unspecified: Secondary | ICD-10-CM | POA: Diagnosis not present

## 2022-04-04 DIAGNOSIS — R972 Elevated prostate specific antigen [PSA]: Secondary | ICD-10-CM | POA: Diagnosis not present

## 2022-04-04 DIAGNOSIS — R351 Nocturia: Secondary | ICD-10-CM | POA: Diagnosis not present

## 2022-04-04 DIAGNOSIS — M545 Low back pain, unspecified: Secondary | ICD-10-CM | POA: Diagnosis not present

## 2022-05-25 DIAGNOSIS — R972 Elevated prostate specific antigen [PSA]: Secondary | ICD-10-CM | POA: Diagnosis not present

## 2022-06-10 ENCOUNTER — Encounter: Payer: Self-pay | Admitting: Orthopaedic Surgery

## 2022-06-10 ENCOUNTER — Ambulatory Visit (INDEPENDENT_AMBULATORY_CARE_PROVIDER_SITE_OTHER): Payer: Medicare HMO

## 2022-06-10 ENCOUNTER — Ambulatory Visit: Payer: Medicare HMO | Admitting: Orthopaedic Surgery

## 2022-06-10 VITALS — BP 182/76 | HR 55 | Ht 67.0 in | Wt 215.0 lb

## 2022-06-10 DIAGNOSIS — G8929 Other chronic pain: Secondary | ICD-10-CM

## 2022-06-10 DIAGNOSIS — M79605 Pain in left leg: Secondary | ICD-10-CM

## 2022-06-10 DIAGNOSIS — M25552 Pain in left hip: Secondary | ICD-10-CM

## 2022-06-10 DIAGNOSIS — M5416 Radiculopathy, lumbar region: Secondary | ICD-10-CM

## 2022-06-10 DIAGNOSIS — M545 Low back pain, unspecified: Secondary | ICD-10-CM | POA: Diagnosis not present

## 2022-06-10 NOTE — Progress Notes (Signed)
Office Visit Note   [name]           Date of Birth: [birth year]           MRN: [id number] Visit Date: 06/10/2022              Requested by: Sigmund Hazel, MD [address] [place],  [place] [address] PCP: Sigmund Hazel, MD   Assessment & Plan: Visit Diagnoses:  1. Pain in left hip   2. Pain in left leg   3. Lumbar radiculopathy   4. Chronic left-sided low back pain without sciatica     Plan: Previous MRI scan lumbar 2019 showed disc degeneration with moderate stenosis.  He had left lateral recess stenosis at L5-S1 with severe foraminal stenosis.  Synovial cyst have been present on previous MRI that had resolved by 2019.  We will set patient up for some physical therapy.  If he does not respond to therapy then we will consider repeat lumbar MRI imaging.  PT at [facility] which is convenient for him.  Follow-Up Instructions: Return in about 6 weeks (around 07/22/2022).   Orders:  Orders Placed This Encounter  Procedures   XR Lumbar Spine 2-3 Views   XR HIP UNILAT W OR W/O PELVIS 2-3 VIEWS LEFT   Ambulatory referral to Physical Therapy   No orders of the defined types were placed in this encounter.     Procedures: No procedures performed   Clinical Data: No additional findings.   Subjective: Chief Complaint  Patient presents with   Lower Back - Pain   Left Hip - Pain    HPI 76 year old male with back problems that radiates in his left leg for several years.  Patient had previous right total hip arthroplasty by Dr. [name] and states about 9 months after his total hip he started having some gradual recurrence of his left leg symptoms.  He has had worsening pain for the last few months radiating into his buttocks and his left thigh.  He denies numbness.  He uses an exercise bike and states walking is more difficult.  Some of his pain is located directly over the ischial tuberosity.  Review of Systems no associated bowel or bladder symptoms.  No fever or  chills.   Objective: Vital Signs: BP (!) 182/76   Pulse (!) 55   Ht 5\' 7"  (1.702 m)   Wt 215 lb (97.5 kg)   BMI 33.67 kg/m   Physical Exam Constitutional:      Appearance: He is well-developed.  HENT:     Head: Normocephalic and atraumatic.     Right Ear: External ear normal.     Left Ear: External ear normal.  Eyes:     Pupils: Pupils are equal, round, and reactive to light.  Neck:     Thyroid: No thyromegaly.     Trachea: No tracheal deviation.  Cardiovascular:     Rate and Rhythm: Normal rate.  Pulmonary:     Effort: Pulmonary effort is normal.     Breath sounds: No wheezing.  Abdominal:     General: Bowel sounds are normal.     Palpations: Abdomen is soft.  Musculoskeletal:     Cervical back: Neck supple.  Skin:    General: Skin is warm and dry.     Capillary Refill: Capillary refill takes less than 2 seconds.  Neurological:     Mental Status: He is alert and oriented to person, place, and time.  Psychiatric:  Behavior: Behavior normal.        Thought Content: Thought content normal.        Judgment: Judgment normal.     Ortho Exam well-healed right total hip arthroplasty incision.  Left ischial tuberosity tenderness some tenderness over the sciatic notch some tenderness over the gluteus medius muscle.  No pain with resisted abduction of the hip.  Negative logroll to the left hip.  Mild discomfort with straight leg raising.  Specialty Comments:  No specialty comments available.  Imaging: IMPRESSION: 1. At L4-5 there is a broad-based disc bulge with a right lateral disc osteophyte complex. Moderate bilateral facet arthropathy. Moderate spinal stenosis and bilateral lateral recess stenosis. No left foraminal stenosis. Moderate right foraminal stenosis. 2. At L5-S1 there is a broad-based disc bulge eccentric towards the left. Severe bilateral facet arthropathy with ligamentum flavum infolding. Left lateral recess stenosis. Mild spinal stenosis. Severe  left foraminal stenosis. Mild right foraminal stenosis. 3 mm left facet posterior extra-spinal synovial cyst. 3. Interval resolution of large intraspinal synovial cyst at L5-S1 and a smaller intraspinal synovial cyst at L4-5.     Electronically Signed   By: Elige Ko   On: 02/03/2018 18:33   PMFS History: Patient Active Problem List   Diagnosis Date Noted   Status post total replacement of right hip 07/19/2019   Unilateral primary osteoarthritis, right hip 06/17/2019   Lumbar radiculopathy 09/24/2018   Foraminal stenosis of lumbar region 09/24/2018   Spondylosis without myelopathy or radiculopathy, lumbar region 08/29/2016   Chronic left-sided low back pain without sciatica 08/29/2016   Past Medical History:  Diagnosis Date   Arthritis    hips and spine   Hypertension    Peripheral neuropathy    bilat    No family history on file.  Past Surgical History:  Procedure Laterality Date   FRACTURE SURGERY Right    radius and collar bone   TOTAL HIP ARTHROPLASTY Right 07/19/2019   Procedure: RIGHT TOTAL HIP ARTHROPLASTY ANTERIOR APPROACH;  Surgeon: Kathryne Hitch, MD;  Location: [facility];  Service: Orthopedics;  Laterality: Right;   Social History   Occupational History   Not on file  Tobacco Use   Smoking status: Never   Smokeless tobacco: Never  Vaping Use   Vaping Use: Never used  Substance and Sexual Activity   Alcohol use: Yes    Alcohol/week: 1.0 standard drink of alcohol    Types: 1 Standard drinks or equivalent per week   Drug use: Never   Sexual activity: Not on file

## 2022-06-28 ENCOUNTER — Ambulatory Visit: Payer: Medicare HMO | Attending: Orthopaedic Surgery | Admitting: Physical Therapy

## 2022-06-28 ENCOUNTER — Encounter: Payer: Self-pay | Admitting: Physical Therapy

## 2022-06-28 DIAGNOSIS — G8929 Other chronic pain: Secondary | ICD-10-CM | POA: Insufficient documentation

## 2022-06-28 DIAGNOSIS — M5459 Other low back pain: Secondary | ICD-10-CM | POA: Insufficient documentation

## 2022-06-28 DIAGNOSIS — R278 Other lack of coordination: Secondary | ICD-10-CM | POA: Insufficient documentation

## 2022-06-28 DIAGNOSIS — M6281 Muscle weakness (generalized): Secondary | ICD-10-CM | POA: Insufficient documentation

## 2022-06-28 DIAGNOSIS — M5416 Radiculopathy, lumbar region: Secondary | ICD-10-CM | POA: Diagnosis not present

## 2022-06-28 DIAGNOSIS — R262 Difficulty in walking, not elsewhere classified: Secondary | ICD-10-CM | POA: Insufficient documentation

## 2022-06-28 DIAGNOSIS — M545 Low back pain, unspecified: Secondary | ICD-10-CM | POA: Diagnosis not present

## 2022-06-28 DIAGNOSIS — R2681 Unsteadiness on feet: Secondary | ICD-10-CM | POA: Insufficient documentation

## 2022-06-28 NOTE — Therapy (Signed)
OUTPATIENT PHYSICAL THERAPY THORACOLUMBAR EVALUATION   Patient Name: Ricardo Stewart MRN: [id number] DOB:11/30/1945, 76 y.o., male Today's Date: 06/28/2022   PT End of Session - 06/28/22 1053     Visit Number 1    Date for PT Re-Evaluation 09/20/22    PT Start Time 0845    PT Stop Time 0928    PT Time Calculation (min) 43 min    Activity Tolerance Patient tolerated treatment well;Patient limited by pain    Behavior During Therapy [facility] for tasks assessed/performed             Past Medical History:  Diagnosis Date   Arthritis    hips and spine   Hypertension    Peripheral neuropathy    bilat   Past Surgical History:  Procedure Laterality Date   FRACTURE SURGERY Right    radius and collar bone   TOTAL HIP ARTHROPLASTY Right 07/19/2019   Procedure: RIGHT TOTAL HIP ARTHROPLASTY ANTERIOR APPROACH;  Surgeon: Mcarthur Rossetti, MD;  Location: [facility];  Service: Orthopedics;  Laterality: Right;   Patient Active Problem List   Diagnosis Date Noted   Status post total replacement of right hip 07/19/2019   Unilateral primary osteoarthritis, right hip 06/17/2019   Lumbar radiculopathy 09/24/2018   Foraminal stenosis of lumbar region 09/24/2018   Spondylosis without myelopathy or radiculopathy, lumbar region 08/29/2016   Chronic left-sided low back pain without sciatica 08/29/2016    PCP: Kathyrn Lass  REFERRING PROVIDER: Marybelle Killings, MD   REFERRING DIAG: M54.16 (ICD-10-CM) - Lumbar radiculopathy M54.50,G89.29 (ICD-10-CM) - Chronic left-sided low back pain without sciatica   Rationale for Evaluation and Treatment Rehabilitation  THERAPY DIAG:  Difficulty in walking, not elsewhere classified  Muscle weakness (generalized)  Other lack of coordination  Unsteadiness on feet  Other low back pain  Lumbar radiculopathy  ONSET DATE: 06/10/2022   SUBJECTIVE:                                                                                                                                                                                            SUBJECTIVE STATEMENT: Patient reports long H/O back pain. He has received treatment here before. Things got better after his R THR. In the past several months, his L side pain has exacerbated. Walking or standing aggravates the pain which starts in his low back and radiates to his buttock. Dr told him he has severe spinal stenosis. He also recently has not been able to sleep. His balance is also impaired.  PERTINENT HISTORY:  HPI 76 year old male with back problems that radiates in his left leg for several years.  Patient  had previous right total hip arthroplasty by Dr. [name] and states about 9 months after his total hip he started having some gradual recurrence of his left leg symptoms.  He has had worsening pain for the last few months radiating into his buttocks and his left thigh.  He denies numbness.  He uses an exercise bike and states walking is more difficult.  Some of his pain is located directly over the ischial tuberosity.   PAIN:  Are you having pain? Yes: NPRS scale: 8/10 Pain location: low back, buttock on L Pain description: shooting Aggravating factors: walking/standing Relieving factors: lying down and pain meds   PRECAUTIONS: None  WEIGHT BEARING RESTRICTIONS: No  FALLS:  Has patient fallen in last 6 months? No  LIVING ENVIRONMENT: Lives with: lives with their spouse Lives in: House/apartment Stairs: Yes: Internal: 14 steps; on right going up Has following equipment at home: None  OCCUPATION: Retired, likes to walk daily, has started riding a stationary bike more now.  PLOF: Independent  PATIENT GOALS: Be able to walk daily without pain   OBJECTIVE:   DIAGNOSTIC FINDINGS:  Imaging: IMPRESSION: 1. At L4-5 there is a broad-based disc bulge with a right lateral disc osteophyte complex. Moderate bilateral facet arthropathy. Moderate spinal stenosis and bilateral lateral recess  stenosis. No left foraminal stenosis. Moderate right foraminal stenosis. 2. At L5-S1 there is a broad-based disc bulge eccentric towards the left. Severe bilateral facet arthropathy with ligamentum flavum infolding. Left lateral recess stenosis. Mild spinal stenosis. Severe left foraminal stenosis. Mild right foraminal stenosis. 3 mm left facet posterior extra-spinal synovial cyst. 3. Interval resolution of large intraspinal synovial cyst at L5-S1 and a smaller intraspinal synovial cyst at L4-5.Previous MRI scan lumbar 2019 showed disc degeneration with moderate stenosis. He had left lateral recess stenosis at L5-S1 with severe foraminal stenosis. Synovial cyst have been present on previous MRI that had resolved by 2019. We will set patient up for some physical therapy. If he does not respond to therapy then we will consider repeat lumbar MRI imaging. PT at [facility] which is convenient for him.   Previous MRI scan lumbar 2019 showed disc degeneration with moderate stenosis. He had left lateral recess stenosis at L5-S1 with severe foraminal stenosis. Synovial cyst have been present on previous MRI that had resolved by 2019. We will set patient up for some physical therapy. If he does not respond to therapy then we will consider repeat lumbar MRI imaging. PT at [place] which is convenient for him.    SCREENING FOR RED FLAGS: Bowel or bladder incontinence: No Spinal tumors: No Cauda equina syndrome: No Compression fracture: No Abdominal aneurysm: No  COGNITION:  Overall cognitive status: Within functional limits for tasks assessed     SENSATION: Light touch: WFL  MUSCLE LENGTH: Hamstrings: Right 45 deg; Left 45 deg Thomas test:Both sides appear tight  POSTURE: decreased lumbar lordosis and decreased thoracic kyphosis  PALPATION: TTP along L piriformis  LUMBAR ROM:   AROM eval  Flexion To toes  Extension Mildly limited  Right lateral flexion To knee  Left lateral flexion 3"  above knee  Right rotation WNL  Left rotation WNL   (Blank rows = not tested)  LOWER EXTREMITY ROM:   Generally stiff throughout, but WFL  LOWER EXTREMITY MMT:  All other strength WNL in BLE  MMT Right eval Left eval  Hip flexion    Hip extension  4-  Hip abduction  3+  Hip adduction    Hip  internal rotation    Hip external rotation    Knee flexion    Knee extension    Ankle dorsiflexion    Ankle plantarflexion    Ankle inversion    Ankle eversion     (Blank rows = not tested)  LUMBAR SPECIAL TESTS:  Straight leg raise test: Negative and Slump test: Positive  FUNCTIONAL TESTS:  Berg Balance Scale: TBD  GAIT: Distance walked: 27' Assistive device utilized: None Level of assistance: Complete Independence Comments: Antalgic gait with favoring of LLE.    TODAY'S TREATMENT:     PATIENT EDUCATION:  Education details: POC, initial HEP Person educated: Patient Education method: Consulting civil engineer, Demonstration, and Handouts Education comprehension: verbalized understanding and returned demonstration   HOME EXERCISE PROGRAM: Patient reports that he already performs DKTC, SKTC, B bridge, LTR,HS stretch against wall, standing calf stretch  [id number]  ASSESSMENT:  CLINICAL IMPRESSION: Patient is a 76 y.o. who was seen today for physical therapy evaluation and treatment for lumbar radicular pain. He reports pain starts in his L lower back and radiates into his buttocks. Aggravated by walking and standing. He is an avid walker and would like to be able to return to his previous exercise routine. He will benefit from treatment for the acute pain he is experiencing as well as treatment and education for long term control/management of his lumbar stenosis. Patient was performing some strengthening and stretching, but will definitely benefit from additional moves to build stability in his core and to loosen his tight muscles . He also was TTP along L piriformis and will benefit from  [facility] to that area. Finally, he reports decreased balance.   OBJECTIVE IMPAIRMENTS: Abnormal gait, decreased activity tolerance, decreased balance, decreased coordination, decreased mobility, difficulty walking, decreased ROM, decreased strength, impaired flexibility, improper body mechanics, and pain.   ACTIVITY LIMITATIONS: lifting, bending, standing, squatting, sleeping, stairs, and locomotion level  PARTICIPATION LIMITATIONS: meal prep, cleaning, driving, shopping, community activity, and yard work  PERSONAL FACTORS: Past/current experiences are also affecting patient's functional outcome.   REHAB POTENTIAL: Good  CLINICAL DECISION MAKING: Stable/uncomplicated  EVALUATION COMPLEXITY: Moderate   GOALS: Goals reviewed with patient? Yes  SHORT TERM GOALS: Target date: 07/26/2022  I with basic HEP Baseline: Goal status: INITIAL  LONG TERM GOALS: Target date: 09/20/22  I with final HEP Baseline:  Goal status: INITIAL  2.  Patient will score at least 52/56 on BERG Baseline: TBD Goal status: INITIAL  3.  Patient will be able to ambulate at least 1 mile with back pain < 3/10 Baseline: 8/10, limits walking. Goal status: INITIAL  4.  Patient will score a max of 8 on [place] Disability scale to demonstrate significant improvement in back pain Baseline: 15 Goal status: INITIAL  5.  Increase L hip strength to at least 4/5 Baseline: 3+,4- Goal status: INITIAL  PLAN: PT FREQUENCY: 2x/week  PT DURATION: 12 weeks  PLANNED INTERVENTIONS: Therapeutic exercises, Therapeutic activity, Neuromuscular re-education, Balance training, Gait training, Patient/Family education, Self Care, Joint mobilization, Stair training, Dry Needling, Electrical stimulation, Spinal mobilization, Cryotherapy, Moist heat, Traction, Ionotophoresis 4mg /ml Dexamethasone, and Manual therapy.  PLAN FOR NEXT SESSION: Foam roller, update HEP to include trunk stabilization   Marcelina Morel, DPT 06/28/2022,  10:59 AM

## 2022-07-04 ENCOUNTER — Ambulatory Visit: Payer: Medicare HMO | Admitting: Physical Therapy

## 2022-07-05 ENCOUNTER — Encounter: Payer: Self-pay | Admitting: Physical Therapy

## 2022-07-05 ENCOUNTER — Ambulatory Visit: Payer: Medicare HMO | Admitting: Physical Therapy

## 2022-07-05 DIAGNOSIS — M6281 Muscle weakness (generalized): Secondary | ICD-10-CM

## 2022-07-05 DIAGNOSIS — G8929 Other chronic pain: Secondary | ICD-10-CM | POA: Diagnosis not present

## 2022-07-05 DIAGNOSIS — R278 Other lack of coordination: Secondary | ICD-10-CM | POA: Diagnosis not present

## 2022-07-05 DIAGNOSIS — M5459 Other low back pain: Secondary | ICD-10-CM

## 2022-07-05 DIAGNOSIS — M5416 Radiculopathy, lumbar region: Secondary | ICD-10-CM | POA: Diagnosis not present

## 2022-07-05 DIAGNOSIS — R262 Difficulty in walking, not elsewhere classified: Secondary | ICD-10-CM | POA: Diagnosis not present

## 2022-07-05 DIAGNOSIS — R2681 Unsteadiness on feet: Secondary | ICD-10-CM | POA: Diagnosis not present

## 2022-07-05 DIAGNOSIS — M545 Low back pain, unspecified: Secondary | ICD-10-CM | POA: Diagnosis not present

## 2022-07-05 NOTE — Therapy (Signed)
OUTPATIENT PHYSICAL THERAPY THORACOLUMBAR TREATMENT   Patient Name: Ricardo Stewart MRN: [id number] DOB:08/09/1946, 76 y.o., male Today's Date: 07/05/2022   PT End of Session - 07/05/22 1146     Visit Number 2    Date for PT Re-Evaluation 09/20/22    PT Start Time [id number]    PT Stop Time [id number]    PT Time Calculation (min) 50 min    Activity Tolerance Patient tolerated treatment well;Patient limited by pain    Behavior During Therapy [facility] for tasks assessed/performed             Past Medical History:  Diagnosis Date   Arthritis    hips and spine   Hypertension    Peripheral neuropathy    bilat   Past Surgical History:  Procedure Laterality Date   FRACTURE SURGERY Right    radius and collar bone   TOTAL HIP ARTHROPLASTY Right 07/19/2019   Procedure: RIGHT TOTAL HIP ARTHROPLASTY ANTERIOR APPROACH;  Surgeon: Mcarthur Rossetti, MD;  Location: [facility];  Service: Orthopedics;  Laterality: Right;   Patient Active Problem List   Diagnosis Date Noted   Status post total replacement of right hip 07/19/2019   Unilateral primary osteoarthritis, right hip 06/17/2019   Lumbar radiculopathy 09/24/2018   Foraminal stenosis of lumbar region 09/24/2018   Spondylosis without myelopathy or radiculopathy, lumbar region 08/29/2016   Chronic left-sided low back pain without sciatica 08/29/2016    PCP: Kathyrn Lass  REFERRING PROVIDER: Marybelle Killings, MD   REFERRING DIAG: M54.16 (ICD-10-CM) - Lumbar radiculopathy M54.50,G89.29 (ICD-10-CM) - Chronic left-sided low back pain without sciatica   Rationale for Evaluation and Treatment Rehabilitation  THERAPY DIAG:  Difficulty in walking, not elsewhere classified  Muscle weakness (generalized)  Other lack of coordination  Unsteadiness on feet  Other low back pain  Lumbar radiculopathy  ONSET DATE: 06/10/2022   SUBJECTIVE:                                                                                                                                                                                            SUBJECTIVE STATEMENT: No change since evaluation  PERTINENT HISTORY:  HPI 76 year old male with back problems that radiates in his left leg for several years.  Patient had previous right total hip arthroplasty by Dr. [name] and states about 9 months after his total hip he started having some gradual recurrence of his left leg symptoms.  He has had worsening pain for the last few months radiating into his buttocks and his left thigh.  He denies numbness.  He uses an exercise bike and states walking is more difficult.  Some of his pain is located directly over the ischial tuberosity.   PAIN:  Are you having pain? Yes: NPRS scale: 0/10 Pain location: low back, buttock on L Pain description: shooting Aggravating factors: walking/standing Relieving factors: lying down and pain meds   PRECAUTIONS: None  WEIGHT BEARING RESTRICTIONS: No  FALLS:  Has patient fallen in last 6 months? No  LIVING ENVIRONMENT: Lives with: lives with their spouse Lives in: House/apartment Stairs: Yes: Internal: 14 steps; on right going up Has following equipment at home: None  OCCUPATION: Retired, likes to walk daily, has started riding a stationary bike more now.  PLOF: Independent  PATIENT GOALS: Be able to walk daily without pain   OBJECTIVE:   DIAGNOSTIC FINDINGS:  Imaging: IMPRESSION: 1. At L4-5 there is a broad-based disc bulge with a right lateral disc osteophyte complex. Moderate bilateral facet arthropathy. Moderate spinal stenosis and bilateral lateral recess stenosis. No left foraminal stenosis. Moderate right foraminal stenosis. 2. At L5-S1 there is a broad-based disc bulge eccentric towards the left. Severe bilateral facet arthropathy with ligamentum flavum infolding. Left lateral recess stenosis. Mild spinal stenosis. Severe left foraminal stenosis. Mild right foraminal stenosis. 3 mm left facet posterior  extra-spinal synovial cyst. 3. Interval resolution of large intraspinal synovial cyst at L5-S1 and a smaller intraspinal synovial cyst at L4-5.Previous MRI scan lumbar 2019 showed disc degeneration with moderate stenosis. He had left lateral recess stenosis at L5-S1 with severe foraminal stenosis. Synovial cyst have been present on previous MRI that had resolved by 2019. We will set patient up for some physical therapy. If he does not respond to therapy then we will consider repeat lumbar MRI imaging. PT at [place] which is convenient for him.   Previous MRI scan lumbar 2019 showed disc degeneration with moderate stenosis. He had left lateral recess stenosis at L5-S1 with severe foraminal stenosis. Synovial cyst have been present on previous MRI that had resolved by 2019. We will set patient up for some physical therapy. If he does not respond to therapy then we will consider repeat lumbar MRI imaging. PT at [place] which is convenient for him.    SCREENING FOR RED FLAGS: Bowel or bladder incontinence: No Spinal tumors: No Cauda equina syndrome: No Compression fracture: No Abdominal aneurysm: No  COGNITION:  Overall cognitive status: Within functional limits for tasks assessed     SENSATION: Light touch: WFL  MUSCLE LENGTH: Hamstrings: Right 45 deg; Left 45 deg Thomas test:Both sides appear tight  POSTURE: decreased lumbar lordosis and decreased thoracic kyphosis  PALPATION: TTP along L piriformis  LUMBAR ROM:   AROM eval  Flexion To toes  Extension Mildly limited  Right lateral flexion To knee  Left lateral flexion 3" above knee  Right rotation WNL  Left rotation WNL   (Blank rows = not tested)  LOWER EXTREMITY ROM:   Generally stiff throughout, but WFL  LOWER EXTREMITY MMT:  All other strength WNL in BLE  MMT Right eval Left eval  Hip flexion    Hip extension  4-  Hip abduction  3+  Hip adduction    Hip internal rotation    Hip external rotation    Knee  flexion    Knee extension    Ankle dorsiflexion    Ankle plantarflexion    Ankle inversion    Ankle eversion     (Blank rows = not tested)  LUMBAR SPECIAL TESTS:  Straight leg raise test: Negative and Slump test: Positive  FUNCTIONAL TESTS:  Berg Balance Scale: TBD  GAIT: Distance walked: 30' Assistive device utilized: None Level of assistance: Complete Independence Comments: Antalgic gait with favoring of LLE.    TODAY'S TREATMENT:  07/05/22 NuStep L5 x6 min  S2S x 10 bilateral knee tightening Shoulder Ext 10lb 2x10 Rows & Lats 25lb 2x10 Overhead lumbar Ext red ball 2x10   Bridges Bridges LE on Pball bridges, K2C, Oblq    LE stretches HS, piriformis, Single K2C    IFC to L low back/glute area MHP lumbar spine  PATIENT EDUCATION:  Education details: POC, initial HEP Person educated: Patient Education method: Consulting civil engineer, Media planner, and Handouts Education comprehension: verbalized understanding and returned demonstration   HOME EXERCISE PROGRAM: Patient reports that he already performs DKTC, SKTC, B bridge, LTR,HS stretch against wall, standing calf stretch  [id number]  ASSESSMENT:  CLINICAL IMPRESSION: Pt enters doing well with no pain. Pt stated can usually starts in the low back n L side and goes down into thigh. No subjective reports pf increase pain with interventions. Cue needed for pacing at time when completed reps. Core weakness present with shoulder extensions. Some weakness with supine interventions LE on pball. Modalities for pain   OBJECTIVE IMPAIRMENTS: Abnormal gait, decreased activity tolerance, decreased balance, decreased coordination, decreased mobility, difficulty walking, decreased ROM, decreased strength, impaired flexibility, improper body mechanics, and pain.   ACTIVITY LIMITATIONS: lifting, bending, standing, squatting, sleeping, stairs, and locomotion level  PARTICIPATION LIMITATIONS: meal prep, cleaning, driving, shopping, community  activity, and yard work  PERSONAL FACTORS: Past/current experiences are also affecting patient's functional outcome.   REHAB POTENTIAL: Good  CLINICAL DECISION MAKING: Stable/uncomplicated  EVALUATION COMPLEXITY: Moderate   GOALS: Goals reviewed with patient? Yes  SHORT TERM GOALS: Target date: 07/26/2022  I with basic HEP Baseline: Goal status: INITIAL  LONG TERM GOALS: Target date: 09/20/22  I with final HEP Baseline:  Goal status: INITIAL  2.  Patient will score at least 52/56 on BERG Baseline: TBD Goal status: INITIAL  3.  Patient will be able to ambulate at least 1 mile with back pain < 3/10 Baseline: 8/10, limits walking. Goal status: INITIAL  4.  Patient will score a max of 8 on Owestry Disability scale to demonstrate significant improvement in back pain Baseline: 15 Goal status: INITIAL  5.  Increase L hip strength to at least 4/5 Baseline: 3+,4- Goal status: INITIAL  PLAN: PT FREQUENCY: 2x/week  PT DURATION: 12 weeks  PLANNED INTERVENTIONS: Therapeutic exercises, Therapeutic activity, Neuromuscular re-education, Balance training, Gait training, Patient/Family education, Self Care, Joint mobilization, Stair training, Dry Needling, Electrical stimulation, Spinal mobilization, Cryotherapy, Moist heat, Traction, Ionotophoresis 4mg /ml Dexamethasone, and Manual therapy.  PLAN FOR NEXT SESSION: Foam roller, update HEP to include trunk stabilization   Marcelina Morel, DPT 07/05/2022, 12:31 PM

## 2022-07-07 ENCOUNTER — Ambulatory Visit: Payer: Medicare HMO | Admitting: Physical Therapy

## 2022-07-07 ENCOUNTER — Encounter: Payer: Self-pay | Admitting: Physical Therapy

## 2022-07-07 DIAGNOSIS — M6281 Muscle weakness (generalized): Secondary | ICD-10-CM

## 2022-07-07 DIAGNOSIS — G8929 Other chronic pain: Secondary | ICD-10-CM | POA: Diagnosis not present

## 2022-07-07 DIAGNOSIS — M5459 Other low back pain: Secondary | ICD-10-CM | POA: Diagnosis not present

## 2022-07-07 DIAGNOSIS — M545 Low back pain, unspecified: Secondary | ICD-10-CM | POA: Diagnosis not present

## 2022-07-07 DIAGNOSIS — M5416 Radiculopathy, lumbar region: Secondary | ICD-10-CM | POA: Diagnosis not present

## 2022-07-07 DIAGNOSIS — R278 Other lack of coordination: Secondary | ICD-10-CM

## 2022-07-07 DIAGNOSIS — R262 Difficulty in walking, not elsewhere classified: Secondary | ICD-10-CM | POA: Diagnosis not present

## 2022-07-07 DIAGNOSIS — R2681 Unsteadiness on feet: Secondary | ICD-10-CM | POA: Diagnosis not present

## 2022-07-07 NOTE — Therapy (Addendum)
OUTPATIENT PHYSICAL THERAPY THORACOLUMBAR TREATMENT   Patient Name: Ricardo Stewart MRN: [id number] DOB:1945/12/22, 76 y.o., male Today's Date: 07/07/2022   PT End of Session - 07/07/22 1425     Visit Number 3    Date for PT Re-Evaluation 09/20/22    PT Start Time 7672    PT Stop Time 0947    PT Time Calculation (min) 50 min    Activity Tolerance Patient tolerated treatment well;Patient limited by pain    Behavior During Therapy [facility] for tasks assessed/performed             Past Medical History:  Diagnosis Date   Arthritis    hips and spine   Hypertension    Peripheral neuropathy    bilat   Past Surgical History:  Procedure Laterality Date   FRACTURE SURGERY Right    radius and collar bone   TOTAL HIP ARTHROPLASTY Right 07/19/2019   Procedure: RIGHT TOTAL HIP ARTHROPLASTY ANTERIOR APPROACH;  Surgeon: Mcarthur Rossetti, MD;  Location: [facility];  Service: Orthopedics;  Laterality: Right;   Patient Active Problem List   Diagnosis Date Noted   Status post total replacement of right hip 07/19/2019   Unilateral primary osteoarthritis, right hip 06/17/2019   Lumbar radiculopathy 09/24/2018   Foraminal stenosis of lumbar region 09/24/2018   Spondylosis without myelopathy or radiculopathy, lumbar region 08/29/2016   Chronic left-sided low back pain without sciatica 08/29/2016    PCP: Kathyrn Lass  REFERRING PROVIDER: Marybelle Killings, MD   REFERRING DIAG: M54.16 (ICD-10-CM) - Lumbar radiculopathy M54.50,G89.29 (ICD-10-CM) - Chronic left-sided low back pain without sciatica   Rationale for Evaluation and Treatment Rehabilitation  THERAPY DIAG:  Difficulty in walking, not elsewhere classified  Other lack of coordination  Muscle weakness (generalized)  Other low back pain  ONSET DATE: 06/10/2022   SUBJECTIVE:                                                                                                                                                                                            SUBJECTIVE STATEMENT: "Got a little hitch in my giddy up" woke up stiff, went up and down the stairs a few times and drove to [place]. Tightness in the back and leg  PERTINENT HISTORY:  HPI 76 year old male with back problems that radiates in his left leg for several years.  Patient had previous right total hip arthroplasty by Dr. [name] and states about 9 months after his total hip he started having some gradual recurrence of his left leg symptoms.  He has had worsening pain for the last few months radiating into his buttocks and  his left thigh.  He denies numbness.  He uses an exercise bike and states walking is more difficult.  Some of his pain is located directly over the ischial tuberosity.   PAIN:  Are you having pain? Yes: NPRS scale: 6/10 Pain location: low back, buttock on L Pain description: shooting Aggravating factors: walking/standing Relieving factors: lying down and pain meds   PRECAUTIONS: None  WEIGHT BEARING RESTRICTIONS: No  FALLS:  Has patient fallen in last 6 months? No  LIVING ENVIRONMENT: Lives with: lives with their spouse Lives in: House/apartment Stairs: Yes: Internal: 14 steps; on right going up Has following equipment at home: None  OCCUPATION: Retired, likes to walk daily, has started riding a stationary bike more now.  PLOF: Independent  PATIENT GOALS: Be able to walk daily without pain   OBJECTIVE:   DIAGNOSTIC FINDINGS:  Imaging: IMPRESSION: 1. At L4-5 there is a broad-based disc bulge with a right lateral disc osteophyte complex. Moderate bilateral facet arthropathy. Moderate spinal stenosis and bilateral lateral recess stenosis. No left foraminal stenosis. Moderate right foraminal stenosis. 2. At L5-S1 there is a broad-based disc bulge eccentric towards the left. Severe bilateral facet arthropathy with ligamentum flavum infolding. Left lateral recess stenosis. Mild spinal stenosis. Severe left  foraminal stenosis. Mild right foraminal stenosis. 3 mm left facet posterior extra-spinal synovial cyst. 3. Interval resolution of large intraspinal synovial cyst at L5-S1 and a smaller intraspinal synovial cyst at L4-5.Previous MRI scan lumbar 2019 showed disc degeneration with moderate stenosis. He had left lateral recess stenosis at L5-S1 with severe foraminal stenosis. Synovial cyst have been present on previous MRI that had resolved by 2019. We will set patient up for some physical therapy. If he does not respond to therapy then we will consider repeat lumbar MRI imaging. PT at [facility] which is convenient for him.   Previous MRI scan lumbar 2019 showed disc degeneration with moderate stenosis. He had left lateral recess stenosis at L5-S1 with severe foraminal stenosis. Synovial cyst have been present on previous MRI that had resolved by 2019. We will set patient up for some physical therapy. If he does not respond to therapy then we will consider repeat lumbar MRI imaging. PT at [facility] which is convenient for him.    SCREENING FOR RED FLAGS: Bowel or bladder incontinence: No Spinal tumors: No Cauda equina syndrome: No Compression fracture: No Abdominal aneurysm: No  COGNITION:  Overall cognitive status: Within functional limits for tasks assessed     SENSATION: Light touch: WFL  MUSCLE LENGTH: Hamstrings: Right 45 deg; Left 45 deg Thomas test:Both sides appear tight  POSTURE: decreased lumbar lordosis and decreased thoracic kyphosis  PALPATION: TTP along L piriformis  LUMBAR ROM:   AROM eval  Flexion To toes  Extension Mildly limited  Right lateral flexion To knee  Left lateral flexion 3" above knee  Right rotation WNL  Left rotation WNL   (Blank rows = not tested)  LOWER EXTREMITY ROM:   Generally stiff throughout, but WFL  LOWER EXTREMITY MMT:  All other strength WNL in BLE  MMT Right eval Left eval  Hip flexion    Hip extension  4-  Hip abduction  3+   Hip adduction    Hip internal rotation    Hip external rotation    Knee flexion    Knee extension    Ankle dorsiflexion    Ankle plantarflexion    Ankle inversion    Ankle eversion     (Blank rows =  not tested)  LUMBAR SPECIAL TESTS:  Straight leg raise test: Negative and Slump test: Positive  FUNCTIONAL TESTS:  Berg Balance Scale: TBD  GAIT: Distance walked: 98' Assistive device utilized: None Level of assistance: Complete Independence Comments: Antalgic gait with favoring of LLE.    TODAY'S TREATMENT:  07/07/22 NuStep L5 x 6 min LLE stretching HS, Piriformis, ITB, SKC, Lower trunk rotation 5x10'' STM lumbar spine Shoulder Ext 10lb 2x10 Seated rows & lats 35lb 2x10   IFC to L low back/glute area MHP lumbar spine    07/05/22 NuStep L5 x6 min  S2S x 10 bilateral knee tightening Shoulder Ext 10lb 2x10 Rows & Lats 25lb 2x10 Overhead lumbar Ext red ball 2x10   Bridges Bridges LE on Pball bridges, K2C, Oblq    LE stretches HS, piriformis, Single K2C    IFC to L low back/glute area MHP lumbar spine  PATIENT EDUCATION:  Education details: POC, initial HEP Person educated: Patient Education method: Programmer, multimedia, Facilities manager, and Handouts Education comprehension: verbalized understanding and returned demonstration   HOME EXERCISE PROGRAM: Patient reports that he already performs DKTC, SKTC, B bridge, LTR,HS stretch against wall, standing calf stretch  [id number]  [id number]   ASSESSMENT:  CLINICAL IMPRESSION: Pt enters with reports of increase low back pain/ discomfort.  No subjective reports pf increase pain with interventions. LE tightness present with stretching. Cue needed for pacing at time when completed reps. Core weakness present with shoulder extensions.  Modalities for pain   OBJECTIVE IMPAIRMENTS: Abnormal gait, decreased activity tolerance, decreased balance, decreased coordination, decreased mobility, difficulty walking, decreased ROM, decreased  strength, impaired flexibility, improper body mechanics, and pain.   ACTIVITY LIMITATIONS: lifting, bending, standing, squatting, sleeping, stairs, and locomotion level  PARTICIPATION LIMITATIONS: meal prep, cleaning, driving, shopping, community activity, and yard work  PERSONAL FACTORS: Past/current experiences are also affecting patient's functional outcome.   REHAB POTENTIAL: Good  CLINICAL DECISION MAKING: Stable/uncomplicated  EVALUATION COMPLEXITY: Moderate   GOALS: Goals reviewed with patient? Yes  SHORT TERM GOALS: Target date: 07/26/2022  I with basic HEP Baseline: Goal status: INITIAL  LONG TERM GOALS: Target date: 09/20/22  I with final HEP Baseline:  Goal status: INITIAL  2.  Patient will score at least 52/56 on BERG Baseline: TBD Goal status: INITIAL  3.  Patient will be able to ambulate at least 1 mile with back pain < 3/10 Baseline: 8/10, limits walking. Goal status: INITIAL  4.  Patient will score a max of 8 on Owestry Disability scale to demonstrate significant improvement in back pain Baseline: 15 Goal status: INITIAL  5.  Increase L hip strength to at least 4/5 Baseline: 3+,4- Goal status: INITIAL  PLAN: PT FREQUENCY: 2x/week  PT DURATION: 12 weeks  PLANNED INTERVENTIONS: Therapeutic exercises, Therapeutic activity, Neuromuscular re-education, Balance training, Gait training, Patient/Family education, Self Care, Joint mobilization, Stair training, Dry Needling, Electrical stimulation, Spinal mobilization, Cryotherapy, Moist heat, Traction, Ionotophoresis 4mg /ml Dexamethasone, and Manual therapy.  PLAN FOR NEXT SESSION: Foam roller, update HEP to include trunk stabilization   PTA 07/07/2022, 2:26 PM

## 2022-07-12 ENCOUNTER — Ambulatory Visit: Payer: Medicare HMO | Admitting: Physical Therapy

## 2022-07-12 DIAGNOSIS — M5416 Radiculopathy, lumbar region: Secondary | ICD-10-CM | POA: Diagnosis not present

## 2022-07-12 DIAGNOSIS — M545 Low back pain, unspecified: Secondary | ICD-10-CM | POA: Diagnosis not present

## 2022-07-12 DIAGNOSIS — R2681 Unsteadiness on feet: Secondary | ICD-10-CM | POA: Diagnosis not present

## 2022-07-12 DIAGNOSIS — R262 Difficulty in walking, not elsewhere classified: Secondary | ICD-10-CM

## 2022-07-12 DIAGNOSIS — R278 Other lack of coordination: Secondary | ICD-10-CM

## 2022-07-12 DIAGNOSIS — M6281 Muscle weakness (generalized): Secondary | ICD-10-CM

## 2022-07-12 DIAGNOSIS — G8929 Other chronic pain: Secondary | ICD-10-CM | POA: Diagnosis not present

## 2022-07-12 DIAGNOSIS — M5459 Other low back pain: Secondary | ICD-10-CM | POA: Diagnosis not present

## 2022-07-12 NOTE — Therapy (Signed)
OUTPATIENT PHYSICAL THERAPY THORACOLUMBAR TREATMENT   Patient Name: Ricardo Stewart MRN: [id number] DOB:1945/12/12, 76 y.o., male Today's Date: 07/12/2022   PT End of Session - 07/12/22 0849     Visit Number 4    Date for PT Re-Evaluation 09/20/22    PT Start Time 0844    PT Stop Time 0925    PT Time Calculation (min) 41 min    Activity Tolerance Patient tolerated treatment well;Patient limited by pain    Behavior During Therapy [facility] for tasks assessed/performed              Past Medical History:  Diagnosis Date   Arthritis    hips and spine   Hypertension    Peripheral neuropathy    bilat   Past Surgical History:  Procedure Laterality Date   FRACTURE SURGERY Right    radius and collar bone   TOTAL HIP ARTHROPLASTY Right 07/19/2019   Procedure: RIGHT TOTAL HIP ARTHROPLASTY ANTERIOR APPROACH;  Surgeon: Mcarthur Rossetti, MD;  Location: [facility];  Service: Orthopedics;  Laterality: Right;   Patient Active Problem List   Diagnosis Date Noted   Status post total replacement of right hip 07/19/2019   Unilateral primary osteoarthritis, right hip 06/17/2019   Lumbar radiculopathy 09/24/2018   Foraminal stenosis of lumbar region 09/24/2018   Spondylosis without myelopathy or radiculopathy, lumbar region 08/29/2016   Chronic left-sided low back pain without sciatica 08/29/2016    PCP: Kathyrn Lass  REFERRING PROVIDER: Marybelle Killings, MD   REFERRING DIAG: M54.16 (ICD-10-CM) - Lumbar radiculopathy M54.50,G89.29 (ICD-10-CM) - Chronic left-sided low back pain without sciatica   Rationale for Evaluation and Treatment Rehabilitation  THERAPY DIAG:  Difficulty in walking, not elsewhere classified  Other lack of coordination  Muscle weakness (generalized)  Other low back pain  Unsteadiness on feet  Lumbar radiculopathy  ONSET DATE: 06/10/2022   SUBJECTIVE:                                                                                                                                                                                            SUBJECTIVE STATEMENT: Patient reports that he had to take several days off, but has returned to walking and biking.  PERTINENT HISTORY:  HPI 76 year old male with back problems that radiates in his left leg for several years.  Patient had previous right total hip arthroplasty by Dr. Ninfa Stewart and states about 9 months after his total hip he started having some gradual recurrence of his left leg symptoms.  He has had worsening pain for the last few months radiating into his buttocks and his left thigh.  He denies  numbness.  He uses an exercise bike and states walking is more difficult.  Some of his pain is located directly over the ischial tuberosity.   PAIN:  Are you having pain? Yes: NPRS scale: 6/10 Pain location: low back, buttock on L Pain description: shooting Aggravating factors: walking/standing Relieving factors: lying down and pain meds   PRECAUTIONS: None  WEIGHT BEARING RESTRICTIONS: No  FALLS:  Has patient fallen in last 6 months? No  LIVING ENVIRONMENT: Lives with: lives with their spouse Lives in: House/apartment Stairs: Yes: Internal: 14 steps; on right going up Has following equipment at home: None  OCCUPATION: Retired, likes to walk daily, has started riding a stationary bike more now.  PLOF: Independent  PATIENT GOALS: Be able to walk daily without pain   OBJECTIVE:   DIAGNOSTIC FINDINGS:  Imaging: IMPRESSION: 1. At L4-5 there is a broad-based disc bulge with a right lateral disc osteophyte complex. Moderate bilateral facet arthropathy. Moderate spinal stenosis and bilateral lateral recess stenosis. No left foraminal stenosis. Moderate right foraminal stenosis. 2. At L5-S1 there is a broad-based disc bulge eccentric towards the left. Severe bilateral facet arthropathy with ligamentum flavum infolding. Left lateral recess stenosis. Mild spinal stenosis. Severe left foraminal  stenosis. Mild right foraminal stenosis. 3 mm left facet posterior extra-spinal synovial cyst. 3. Interval resolution of large intraspinal synovial cyst at L5-S1 and a smaller intraspinal synovial cyst at L4-5.Previous MRI scan lumbar 2019 showed disc degeneration with moderate stenosis. He had left lateral recess stenosis at L5-S1 with severe foraminal stenosis. Synovial cyst have been present on previous MRI that had resolved by 2019. We will set patient up for some physical therapy. If he does not respond to therapy then we will consider repeat lumbar MRI imaging. PT at [facility] which is convenient for him.   Previous MRI scan lumbar 2019 showed disc degeneration with moderate stenosis. He had left lateral recess stenosis at L5-S1 with severe foraminal stenosis. Synovial cyst have been present on previous MRI that had resolved by 2019. We will set patient up for some physical therapy. If he does not respond to therapy then we will consider repeat lumbar MRI imaging. PT at [facility] which is convenient for him.    SCREENING FOR RED FLAGS: Bowel or bladder incontinence: No Spinal tumors: No Cauda equina syndrome: No Compression fracture: No Abdominal aneurysm: No  COGNITION:  Overall cognitive status: Within functional limits for tasks assessed     SENSATION: Light touch: WFL  MUSCLE LENGTH: Hamstrings: Right 45 deg; Left 45 deg Thomas test:Both sides appear tight  POSTURE: decreased lumbar lordosis and decreased thoracic kyphosis  PALPATION: TTP along L piriformis  LUMBAR ROM:   AROM eval  Flexion To toes  Extension Mildly limited  Right lateral flexion To knee  Left lateral flexion 3" above knee  Right rotation WNL  Left rotation WNL   (Blank rows = not tested)  LOWER EXTREMITY ROM:   Generally stiff throughout, but WFL  LOWER EXTREMITY MMT:  All other strength WNL in BLE  MMT Right eval Left eval  Hip flexion    Hip extension  4-  Hip abduction  3+  Hip  adduction    Hip internal rotation    Hip external rotation    Knee flexion    Knee extension    Ankle dorsiflexion    Ankle plantarflexion    Ankle inversion    Ankle eversion     (Blank rows = not tested)  LUMBAR SPECIAL TESTS:  Straight leg raise test: Negative and Slump test: Positive  FUNCTIONAL TESTS:  Berg Balance Scale: TBD  GAIT: Distance walked: 82' Assistive device utilized: None Level of assistance: Complete Independence Comments: Antalgic gait with favoring of LLE.    TODAY'S TREATMENT:  07/12/22 NuStep L5  x 6 minutes Seated piriformis and ITB stretches, HS stretch Thomas stretch STM with tennis ball to piriformis  Supine- Bridge with Tband abd resistance, clamshells, 10 reps each Dynamic stretching, butt kicks, frogger, hip flexor wall stretch, HS stretch.  07/07/22 NuStep L5 x 6 min LLE stretching HS, Piriformis, ITB, SKC, Lower trunk rotation 5x10'' STM lumbar spine Shoulder Ext 10lb 2x10 Seated rows & lats 35lb 2x10   IFC to L low back/glute area MHP lumbar spine    07/05/22 NuStep L5 x6 min  S2S x 10 bilateral knee tightening Shoulder Ext 10lb 2x10 Rows & Lats 25lb 2x10 Overhead lumbar Ext red ball 2x10   Bridges Bridges LE on Pball bridges, K2C, Oblq    LE stretches HS, piriformis, Single K2C    IFC to L low back/glute area MHP lumbar spine  PATIENT EDUCATION:  Education details: POC, initial HEP Person educated: Patient Education method: Programmer, multimedia, Facilities manager, and Handouts Education comprehension: verbalized understanding and returned demonstration   HOME EXERCISE PROGRAM: Patient reports that he already performs DKTC, SKTC, B bridge, LTR,HS stretch against wall, standing calf stretch  [id number]  [id number]   ASSESSMENT:  CLINICAL IMPRESSION: Pt enters with reports improved back pain. Introduced dynamic stretching, piriformis STM, and also trunk/core strengthening, adding to his HEP. He tolerated all without C/O  pain.   OBJECTIVE IMPAIRMENTS: Abnormal gait, decreased activity tolerance, decreased balance, decreased coordination, decreased mobility, difficulty walking, decreased ROM, decreased strength, impaired flexibility, improper body mechanics, and pain.   ACTIVITY LIMITATIONS: lifting, bending, standing, squatting, sleeping, stairs, and locomotion level  PARTICIPATION LIMITATIONS: meal prep, cleaning, driving, shopping, community activity, and yard work  PERSONAL FACTORS: Past/current experiences are also affecting patient's functional outcome.   REHAB POTENTIAL: Good  CLINICAL DECISION MAKING: Stable/uncomplicated  EVALUATION COMPLEXITY: Moderate   GOALS: Goals reviewed with patient? Yes  SHORT TERM GOALS: Target date: 07/26/2022  I with basic HEP Baseline: Goal status: INITIAL  LONG TERM GOALS: Target date: 09/20/22  I with final HEP Baseline:  Goal status: INITIAL  2.  Patient will score at least 52/56 on BERG Baseline: TBD Goal status: INITIAL  3.  Patient will be able to ambulate at least 1 mile with back pain < 3/10 Baseline: 8/10, limits walking. Goal status: INITIAL  4.  Patient will score a max of 8 on Owestry Disability scale to demonstrate significant improvement in back pain Baseline: 15 Goal status: INITIAL  5.  Increase L hip strength to at least 4/5 Baseline: 3+,4- Goal status: INITIAL  PLAN: PT FREQUENCY: 2x/week  PT DURATION: 12 weeks  PLANNED INTERVENTIONS: Therapeutic exercises, Therapeutic activity, Neuromuscular re-education, Balance training, Gait training, Patient/Family education, Self Care, Joint mobilization, Stair training, Dry Needling, Electrical stimulation, Spinal mobilization, Cryotherapy, Moist heat, Traction, Ionotophoresis 4mg /ml Dexamethasone, and Manual therapy.  PLAN FOR NEXT SESSION: Foam roller, update HEP to include trunk stabilization   DPT 07/12/22 9:31 AM  07/12/2022, 9:31 AM

## 2022-07-13 ENCOUNTER — Ambulatory Visit: Payer: Medicare HMO | Admitting: Orthopaedic Surgery

## 2022-07-13 ENCOUNTER — Encounter: Payer: Self-pay | Admitting: Orthopaedic Surgery

## 2022-07-13 VITALS — BP 162/78 | HR 47 | Ht 67.0 in | Wt 215.0 lb

## 2022-07-13 DIAGNOSIS — M47816 Spondylosis without myelopathy or radiculopathy, lumbar region: Secondary | ICD-10-CM

## 2022-07-13 NOTE — Progress Notes (Signed)
Office Visit Note   [name]           Date of Birth: May 31, 1946           MRN: [id number] Visit Date: 07/13/2022              Requested by: Kathyrn Lass, [place],  [place] [id number] PCP: Kathyrn Lass, MD   Assessment & Plan: Visit Diagnoses:  1. Spondylosis without myelopathy or radiculopathy, lumbar region     Plan: Continue PT we will recheck in 6 weeks.  He still having problems at 4 weeks she can call we can consider repeating MRI scan where he had disc osteophyte complex at L4-5 with moderate stenosis and less severe changes at L5-S1.  He had had previous synovial cyst at L5-S1 previously large decrease in size by 2019 and no reimaging since then.  Follow-Up Instructions: Return in about 6 weeks (around 08/24/2022).   Orders:  No orders of the defined types were placed in this encounter.  No orders of the defined types were placed in this encounter.     Procedures: No procedures performed   Clinical Data: No additional findings.   Subjective: Chief Complaint  Patient presents with   Lower Back - Follow-up, Pain   Left Leg - Pain, Follow-up    HPI 76 year old male returns for ongoing problems with back pain and left buttocks pain.  He has had evaluation by PT in 2 visits and is doing a lot of extra stretching.  He normally did stretching in the morning for he did his walking.  He is not really having claudication symptoms.  He has increased discomfort with the immobility does some better when he is active and if he is too active he has increased symptoms.  Previous right total of arthroplasty 2020 by Dr. Zollie Beckers doing well.  He has used ibuprofen with some relief.  Review of Systems updated unchanged from previous visit.   Objective: Vital Signs: BP (!) 162/78   Pulse (!) 47   Ht 5\' 7"  (1.702 m)   Wt 215 lb (97.5 kg)   BMI 33.67 kg/m   Physical Exam Constitutional:      Appearance: He is well-developed.  HENT:      Head: Normocephalic and atraumatic.     Right Ear: External ear normal.     Left Ear: External ear normal.  Eyes:     Pupils: Pupils are equal, round, and reactive to light.  Neck:     Thyroid: No thyromegaly.     Trachea: No tracheal deviation.  Cardiovascular:     Rate and Rhythm: Normal rate.  Pulmonary:     Effort: Pulmonary effort is normal.     Breath sounds: No wheezing.  Abdominal:     General: Bowel sounds are normal.     Palpations: Abdomen is soft.  Musculoskeletal:     Cervical back: Neck supple.  Skin:    General: Skin is warm and dry.     Capillary Refill: Capillary refill takes less than 2 seconds.  Neurological:     Mental Status: He is alert and oriented to person, place, and time.  Psychiatric:        Behavior: Behavior normal.        Thought Content: Thought content normal.        Judgment: Judgment normal.     Ortho Exam patient gets from sitting standing minimal sciatic notch tenderness.  Some tenderness over the  ischial tuberosity.  Negative logroll to hips right and left.  Specialty Comments:  No specialty comments available.  Imaging: No results found.   PMFS History: Patient Active Problem List   Diagnosis Date Noted   Status post total replacement of right hip 07/19/2019   Unilateral primary osteoarthritis, right hip 06/17/2019   Lumbar radiculopathy 09/24/2018   Foraminal stenosis of lumbar region 09/24/2018   Spondylosis without myelopathy or radiculopathy, lumbar region 08/29/2016   Chronic left-sided low back pain without sciatica 08/29/2016   Past Medical History:  Diagnosis Date   Arthritis    hips and spine   Hypertension    Peripheral neuropathy    bilat    No family history on file.  Past Surgical History:  Procedure Laterality Date   FRACTURE SURGERY Right    radius and collar bone   TOTAL HIP ARTHROPLASTY Right 07/19/2019   Procedure: RIGHT TOTAL HIP ARTHROPLASTY ANTERIOR APPROACH;  Surgeon: Kathryne Hitch,  MD;  Location: [facility];  Service: Orthopedics;  Laterality: Right;   Social History   Occupational History   Not on file  Tobacco Use   Smoking status: Never   Smokeless tobacco: Never  Vaping Use   Vaping Use: Never used  Substance and Sexual Activity   Alcohol use: Yes    Alcohol/week: 1.0 standard drink of alcohol    Types: 1 Standard drinks or equivalent per week   Drug use: Never   Sexual activity: Not on file

## 2022-07-14 ENCOUNTER — Ambulatory Visit: Payer: Medicare HMO | Attending: Orthopaedic Surgery | Admitting: Physical Therapy

## 2022-07-14 ENCOUNTER — Encounter: Payer: Self-pay | Admitting: Physical Therapy

## 2022-07-14 DIAGNOSIS — R2689 Other abnormalities of gait and mobility: Secondary | ICD-10-CM | POA: Insufficient documentation

## 2022-07-14 DIAGNOSIS — M5459 Other low back pain: Secondary | ICD-10-CM | POA: Insufficient documentation

## 2022-07-14 DIAGNOSIS — R278 Other lack of coordination: Secondary | ICD-10-CM | POA: Insufficient documentation

## 2022-07-14 DIAGNOSIS — R293 Abnormal posture: Secondary | ICD-10-CM | POA: Insufficient documentation

## 2022-07-14 DIAGNOSIS — M6281 Muscle weakness (generalized): Secondary | ICD-10-CM | POA: Diagnosis not present

## 2022-07-14 DIAGNOSIS — M5416 Radiculopathy, lumbar region: Secondary | ICD-10-CM | POA: Insufficient documentation

## 2022-07-14 DIAGNOSIS — R2681 Unsteadiness on feet: Secondary | ICD-10-CM | POA: Diagnosis not present

## 2022-07-14 DIAGNOSIS — R600 Localized edema: Secondary | ICD-10-CM | POA: Diagnosis not present

## 2022-07-14 DIAGNOSIS — R262 Difficulty in walking, not elsewhere classified: Secondary | ICD-10-CM | POA: Insufficient documentation

## 2022-07-14 NOTE — Therapy (Signed)
OUTPATIENT PHYSICAL THERAPY THORACOLUMBAR TREATMENT   Patient Name: Ricardo Stewart MRN: [id number] DOB:05-07-1946, 76 y.o., male Today's Date: 07/14/2022   PT End of Session - 07/14/22 0852     Visit Number 5    Date for PT Re-Evaluation 09/20/22    PT Start Time 0844    PT Stop Time 0925    PT Time Calculation (min) 41 min    Activity Tolerance Patient tolerated treatment well;Patient limited by pain    Behavior During Therapy [facility] for tasks assessed/performed               Past Medical History:  Diagnosis Date   Arthritis    hips and spine   Hypertension    Peripheral neuropathy    bilat   Past Surgical History:  Procedure Laterality Date   FRACTURE SURGERY Right    radius and collar bone   TOTAL HIP ARTHROPLASTY Right 07/19/2019   Procedure: RIGHT TOTAL HIP ARTHROPLASTY ANTERIOR APPROACH;  Surgeon: Mcarthur Rossetti, MD;  Location: [facility];  Service: Orthopedics;  Laterality: Right;   Patient Active Problem List   Diagnosis Date Noted   Status post total replacement of right hip 07/19/2019   Unilateral primary osteoarthritis, right hip 06/17/2019   Lumbar radiculopathy 09/24/2018   Foraminal stenosis of lumbar region 09/24/2018   Spondylosis without myelopathy or radiculopathy, lumbar region 08/29/2016   Chronic left-sided low back pain without sciatica 08/29/2016    PCP: Kathyrn Lass  REFERRING PROVIDER: Marybelle Killings, MD   REFERRING DIAG: M54.16 (ICD-10-CM) - Lumbar radiculopathy M54.50,G89.29 (ICD-10-CM) - Chronic left-sided low back pain without sciatica   Rationale for Evaluation and Treatment Rehabilitation  THERAPY DIAG:  Difficulty in walking, not elsewhere classified  Other lack of coordination  Other low back pain  Muscle weakness (generalized)  Unsteadiness on feet  Lumbar radiculopathy  ONSET DATE: 06/10/2022   SUBJECTIVE:                                                                                                                                                                                            SUBJECTIVE STATEMENT: Patient reports that he had a rough day yesterday due to standing a lot, but is improved today. His L hamstring is tight. He saw Dr Lorin Mercy, who is concerned that he may require surgery, but was pleased that patient feels the therapy is helping.  PERTINENT HISTORY:  HPI 76 year old male with back problems that radiates in his left leg for several years.  Patient had previous right total hip arthroplasty by Dr. [name] and states about 9 months after his total hip he started having some  gradual recurrence of his left leg symptoms.  He has had worsening pain for the last few months radiating into his buttocks and his left thigh.  He denies numbness.  He uses an exercise bike and states walking is more difficult.  Some of his pain is located directly over the ischial tuberosity.   PAIN:  Are you having pain? Yes: NPRS scale: 6/10 Pain location: low back, buttock on L Pain description: shooting Aggravating factors: walking/standing Relieving factors: lying down and pain meds   PRECAUTIONS: None  WEIGHT BEARING RESTRICTIONS: No  FALLS:  Has patient fallen in last 6 months? No  LIVING ENVIRONMENT: Lives with: lives with their spouse Lives in: House/apartment Stairs: Yes: Internal: 14 steps; on right going up Has following equipment at home: None  OCCUPATION: Retired, likes to walk daily, has started riding a stationary bike more now.  PLOF: Independent  PATIENT GOALS: Be able to walk daily without pain   OBJECTIVE:   DIAGNOSTIC FINDINGS:  Imaging: IMPRESSION: 1. At L4-5 there is a broad-based disc bulge with a right lateral disc osteophyte complex. Moderate bilateral facet arthropathy. Moderate spinal stenosis and bilateral lateral recess stenosis. No left foraminal stenosis. Moderate right foraminal stenosis. 2. At L5-S1 there is a broad-based disc bulge eccentric towards  the left. Severe bilateral facet arthropathy with ligamentum flavum infolding. Left lateral recess stenosis. Mild spinal stenosis. Severe left foraminal stenosis. Mild right foraminal stenosis. 3 mm left facet posterior extra-spinal synovial cyst. 3. Interval resolution of large intraspinal synovial cyst at L5-S1 and a smaller intraspinal synovial cyst at L4-5.Previous MRI scan lumbar 2019 showed disc degeneration with moderate stenosis. He had left lateral recess stenosis at L5-S1 with severe foraminal stenosis. Synovial cyst have been present on previous MRI that had resolved by 2019. We will set patient up for some physical therapy. If he does not respond to therapy then we will consider repeat lumbar MRI imaging. PT at [facility] which is convenient for him.   Previous MRI scan lumbar 2019 showed disc degeneration with moderate stenosis. He had left lateral recess stenosis at L5-S1 with severe foraminal stenosis. Synovial cyst have been present on previous MRI that had resolved by 2019. We will set patient up for some physical therapy. If he does not respond to therapy then we will consider repeat lumbar MRI imaging. PT at [facility] which is convenient for him.    SCREENING FOR RED FLAGS: Bowel or bladder incontinence: No Spinal tumors: No Cauda equina syndrome: No Compression fracture: No Abdominal aneurysm: No  COGNITION:  Overall cognitive status: Within functional limits for tasks assessed     SENSATION: Light touch: WFL  MUSCLE LENGTH: Hamstrings: Right 45 deg; Left 45 deg Thomas test:Both sides appear tight  POSTURE: decreased lumbar lordosis and decreased thoracic kyphosis  PALPATION: TTP along L piriformis  LUMBAR ROM:   AROM eval  Flexion To toes  Extension Mildly limited  Right lateral flexion To knee  Left lateral flexion 3" above knee  Right rotation WNL  Left rotation WNL   (Blank rows = not tested)  LOWER EXTREMITY ROM:   Generally stiff throughout, but  WFL  LOWER EXTREMITY MMT:  All other strength WNL in BLE  MMT Right eval Left eval  Hip flexion    Hip extension  4-  Hip abduction  3+  Hip adduction    Hip internal rotation    Hip external rotation    Knee flexion    Knee extension    Ankle  dorsiflexion    Ankle plantarflexion    Ankle inversion    Ankle eversion     (Blank rows = not tested)  LUMBAR SPECIAL TESTS:  Straight leg raise test: Negative and Slump test: Positive  FUNCTIONAL TESTS:  Berg Balance Scale: TBD  GAIT: Distance walked: 59' Assistive device utilized: None Level of assistance: Complete Independence Comments: Antalgic gait with favoring of LLE.    TODAY'S TREATMENT:  07/14/22 Recumbent bike L4 x 6 minutes Supine stretch with strap, HS, add, abd, 2 x 20 sec each leg Bridge with hip abd resistance from G tband, 10 reps Single leg bridge x 10 with each leg Dead bug, legs only, arms remain pointed to ceiling. Thomas stretch, 30 sec B Seated HS stretch, 30 sec each leg Standing hip flexor stretch against wall. Standing shoulder ext, rows, ER against G tband, 2 x 10 reps each Paloff press 20# 2 x 10 each side  07/12/22 NuStep L5  x 6 minutes Seated piriformis and ITB stretches, HS stretch Thomas stretch STM with tennis ball to piriformis  Supine- Bridge with Tband abd resistance, clamshells, 10 reps each Dynamic stretching, butt kicks, frogger, hip flexor wall stretch, HS stretch.  07/07/22 NuStep L5 x 6 min LLE stretching HS, Piriformis, ITB, SKC, Lower trunk rotation 5x10'' STM lumbar spine Shoulder Ext 10lb 2x10 Seated rows & lats 35lb 2x10   IFC to L low back/glute area MHP lumbar spine  07/05/22 NuStep L5 x6 min  S2S x 10 bilateral knee tightening Shoulder Ext 10lb 2x10 Rows & Lats 25lb 2x10 Overhead lumbar Ext red ball 2x10   Bridges Bridges LE on Pball bridges, K2C, Oblq    LE stretches HS, piriformis, Single K2C    IFC to L low back/glute area MHP lumbar spine  PATIENT  EDUCATION:  Education details: POC, initial HEP Person educated: Patient Education method: Programmer, multimedia, Facilities manager, and Handouts Education comprehension: verbalized understanding and returned demonstration   HOME EXERCISE PROGRAM: Patient reports that he already performs DKTC, SKTC, B bridge, LTR,HS stretch against wall, standing calf stretch  [id number]  [id number]   ASSESSMENT:  CLINICAL IMPRESSION: Pt reports tight L HS today. Treatment moved from stretching lower body and hips to stabilization and strengthening.   OBJECTIVE IMPAIRMENTS: Abnormal gait, decreased activity tolerance, decreased balance, decreased coordination, decreased mobility, difficulty walking, decreased ROM, decreased strength, impaired flexibility, improper body mechanics, and pain.   ACTIVITY LIMITATIONS: lifting, bending, standing, squatting, sleeping, stairs, and locomotion level  PARTICIPATION LIMITATIONS: meal prep, cleaning, driving, shopping, community activity, and yard work  PERSONAL FACTORS: Past/current experiences are also affecting patient's functional outcome.   REHAB POTENTIAL: Good  CLINICAL DECISION MAKING: Stable/uncomplicated  EVALUATION COMPLEXITY: Moderate   GOALS: Goals reviewed with patient? Yes  SHORT TERM GOALS: Target date: 07/26/2022  I with basic HEP Baseline: Goal status: INITIAL  LONG TERM GOALS: Target date: 09/20/22  I with final HEP Baseline:  Goal status: INITIAL  2.  Patient will score at least 52/56 on BERG Baseline: TBD Goal status: INITIAL  3.  Patient will be able to ambulate at least 1 mile with back pain < 3/10 Baseline: 8/10, limits walking. Goal status: INITIAL  4.  Patient will score a max of 8 on Owestry Disability scale to demonstrate significant improvement in back pain Baseline: 15 Goal status: INITIAL  5.  Increase L hip strength to at least 4/5 Baseline: 3+,4- Goal status: INITIAL  PLAN: PT FREQUENCY: 2x/week  PT DURATION: 12  weeks  PLANNED INTERVENTIONS: Therapeutic exercises,  Therapeutic activity, Neuromuscular re-education, Balance training, Gait training, Patient/Family education, Self Care, Joint mobilization, Stair training, Dry Needling, Electrical stimulation, Spinal mobilization, Cryotherapy, Moist heat, Traction, Ionotophoresis 4mg /ml Dexamethasone, and Manual therapy.  PLAN FOR NEXT SESSION: Foam roller, update HEP to include trunk stabilization   DPT 07/14/22 9:25 AM  07/14/2022, 9:25 AM

## 2022-07-19 ENCOUNTER — Ambulatory Visit: Payer: Medicare HMO | Admitting: Physical Therapy

## 2022-07-19 DIAGNOSIS — R262 Difficulty in walking, not elsewhere classified: Secondary | ICD-10-CM | POA: Diagnosis not present

## 2022-07-19 DIAGNOSIS — R293 Abnormal posture: Secondary | ICD-10-CM | POA: Diagnosis not present

## 2022-07-19 DIAGNOSIS — R2689 Other abnormalities of gait and mobility: Secondary | ICD-10-CM | POA: Diagnosis not present

## 2022-07-19 DIAGNOSIS — M5459 Other low back pain: Secondary | ICD-10-CM | POA: Diagnosis not present

## 2022-07-19 DIAGNOSIS — M6281 Muscle weakness (generalized): Secondary | ICD-10-CM | POA: Diagnosis not present

## 2022-07-19 DIAGNOSIS — R2681 Unsteadiness on feet: Secondary | ICD-10-CM | POA: Diagnosis not present

## 2022-07-19 DIAGNOSIS — M5416 Radiculopathy, lumbar region: Secondary | ICD-10-CM | POA: Diagnosis not present

## 2022-07-19 DIAGNOSIS — R278 Other lack of coordination: Secondary | ICD-10-CM | POA: Diagnosis not present

## 2022-07-19 DIAGNOSIS — R600 Localized edema: Secondary | ICD-10-CM | POA: Diagnosis not present

## 2022-07-19 NOTE — Therapy (Signed)
OUTPATIENT PHYSICAL THERAPY THORACOLUMBAR TREATMENT   Patient Name: Ricardo Stewart MRN: [id number] DOB:11/12/1945, 76 y.o., male Today's Date: 07/19/2022   PT End of Session - 07/19/22 0936     Visit Number 6    Date for PT Re-Evaluation 09/20/22    PT Start Time 0930    PT Stop Time 1010    PT Time Calculation (min) 40 min    Activity Tolerance Patient tolerated treatment well;Patient limited by pain    Behavior During Therapy [facility] for tasks assessed/performed               Past Medical History:  Diagnosis Date   Arthritis    hips and spine   Hypertension    Peripheral neuropathy    bilat   Past Surgical History:  Procedure Laterality Date   FRACTURE SURGERY Right    radius and collar bone   TOTAL HIP ARTHROPLASTY Right 07/19/2019   Procedure: RIGHT TOTAL HIP ARTHROPLASTY ANTERIOR APPROACH;  Surgeon: Kathryne Hitch, MD;  Location: [facility];  Service: Orthopedics;  Laterality: Right;   Patient Active Problem List   Diagnosis Date Noted   Status post total replacement of right hip 07/19/2019   Unilateral primary osteoarthritis, right hip 06/17/2019   Lumbar radiculopathy 09/24/2018   Foraminal stenosis of lumbar region 09/24/2018   Spondylosis without myelopathy or radiculopathy, lumbar region 08/29/2016   Chronic left-sided low back pain without sciatica 08/29/2016    PCP: Sigmund Hazel  REFERRING PROVIDER: Eldred Manges, MD   REFERRING DIAG: M54.16 (ICD-10-CM) - Lumbar radiculopathy M54.50,G89.29 (ICD-10-CM) - Chronic left-sided low back pain without sciatica   Rationale for Evaluation and Treatment Rehabilitation  THERAPY DIAG:  Difficulty in walking, not elsewhere classified  Other lack of coordination  Other low back pain  Muscle weakness (generalized)  Unsteadiness on feet  ONSET DATE: 06/10/2022   SUBJECTIVE:                                                                                                                                                                                            SUBJECTIVE STATEMENT: Patient reports no big change in pain.  PERTINENT HISTORY:  HPI 76 year old male with back problems that radiates in his left leg for several years.  Patient had previous right total hip arthroplasty by Dr. [name] and states about 9 months after his total hip he started having some gradual recurrence of his left leg symptoms.  He has had worsening pain for the last few months radiating into his buttocks and his left thigh.  He denies numbness.  He uses an exercise bike and states walking is more  difficult.  Some of his pain is located directly over the ischial tuberosity.   PAIN:  Are you having pain? Yes: NPRS scale: 6/10 Pain location: low back, buttock on L Pain description: shooting Aggravating factors: walking/standing Relieving factors: lying down and pain meds   PRECAUTIONS: None  WEIGHT BEARING RESTRICTIONS: No  FALLS:  Has patient fallen in last 6 months? No  LIVING ENVIRONMENT: Lives with: lives with their spouse Lives in: House/apartment Stairs: Yes: Internal: 14 steps; on right going up Has following equipment at home: None  OCCUPATION: Retired, likes to walk daily, has started riding a stationary bike more now.  PLOF: Independent  PATIENT GOALS: Be able to walk daily without pain   OBJECTIVE:   DIAGNOSTIC FINDINGS:  Imaging: IMPRESSION: 1. At L4-5 there is a broad-based disc bulge with a right lateral disc osteophyte complex. Moderate bilateral facet arthropathy. Moderate spinal stenosis and bilateral lateral recess stenosis. No left foraminal stenosis. Moderate right foraminal stenosis. 2. At L5-S1 there is a broad-based disc bulge eccentric towards the left. Severe bilateral facet arthropathy with ligamentum flavum infolding. Left lateral recess stenosis. Mild spinal stenosis. Severe left foraminal stenosis. Mild right foraminal stenosis. 3 mm left facet posterior extra-spinal  synovial cyst. 3. Interval resolution of large intraspinal synovial cyst at L5-S1 and a smaller intraspinal synovial cyst at L4-5.Previous MRI scan lumbar 2019 showed disc degeneration with moderate stenosis. He had left lateral recess stenosis at L5-S1 with severe foraminal stenosis. Synovial cyst have been present on previous MRI that had resolved by 2019. We will set patient up for some physical therapy. If he does not respond to therapy then we will consider repeat lumbar MRI imaging. PT at [facility] which is convenient for him.   Previous MRI scan lumbar 2019 showed disc degeneration with moderate stenosis. He had left lateral recess stenosis at L5-S1 with severe foraminal stenosis. Synovial cyst have been present on previous MRI that had resolved by 2019. We will set patient up for some physical therapy. If he does not respond to therapy then we will consider repeat lumbar MRI imaging. PT at [facility] which is convenient for him.    SCREENING FOR RED FLAGS: Bowel or bladder incontinence: No Spinal tumors: No Cauda equina syndrome: No Compression fracture: No Abdominal aneurysm: No  COGNITION:  Overall cognitive status: Within functional limits for tasks assessed     SENSATION: Light touch: WFL  MUSCLE LENGTH: Hamstrings: Right 45 deg; Left 45 deg Thomas test:Both sides appear tight  POSTURE: decreased lumbar lordosis and decreased thoracic kyphosis  PALPATION: TTP along L piriformis  LUMBAR ROM:   AROM eval  Flexion To toes  Extension Mildly limited  Right lateral flexion To knee  Left lateral flexion 3" above knee  Right rotation WNL  Left rotation WNL   (Blank rows = not tested)  LOWER EXTREMITY ROM:   Generally stiff throughout, but WFL  LOWER EXTREMITY MMT:  All other strength WNL in BLE  MMT Right eval Left eval  Hip flexion    Hip extension  4-  Hip abduction  3+  Hip adduction    Hip internal rotation    Hip external rotation    Knee flexion     Knee extension    Ankle dorsiflexion    Ankle plantarflexion    Ankle inversion    Ankle eversion     (Blank rows = not tested)  LUMBAR SPECIAL TESTS:  Straight leg raise test: Negative and Slump test: Positive  FUNCTIONAL  TESTS:  Berg Balance Scale: TBD  GAIT: Distance walked: 48' Assistive device utilized: None Level of assistance: Complete Independence Comments: Antalgic gait with favoring of LLE.    TODAY'S TREATMENT:  07/19/22 NuStep L5 x 6 minutes Supine low back stretches including 3 way hip stretch with strap, ITB, glut, and piriformis stretches, HEP updated to include Self STM using tennis ball on L piriformis. Additional passive piriformis stretch Clamshells against G tband resistance, 2 x 10 reps Post pelvic tilts in supine- March while holding PPT   07/14/22 Recumbent bike L4 x 6 minutes Supine stretch with strap, HS, add, abd, 2 x 20 sec each leg Bridge with hip abd resistance from G tband, 10 reps Single leg bridge x 10 with each leg Dead bug, legs only, arms remain pointed to ceiling. Thomas stretch, 30 sec B Seated HS stretch, 30 sec each leg Standing hip flexor stretch against wall. Standing shoulder ext, rows, ER against G tband, 2 x 10 reps each Paloff press 20# 2 x 10 each side  07/12/22 NuStep L5  x 6 minutes Seated piriformis and ITB stretches, HS stretch Thomas stretch STM with tennis ball to piriformis  Supine- Bridge with Tband abd resistance, clamshells, 10 reps each Dynamic stretching, butt kicks, frogger, hip flexor wall stretch, HS stretch.  07/07/22 NuStep L5 x 6 min LLE stretching HS, Piriformis, ITB, SKC, Lower trunk rotation 5x10'' STM lumbar spine Shoulder Ext 10lb 2x10 Seated rows & lats 35lb 2x10   IFC to L low back/glute area MHP lumbar spine  07/05/22 NuStep L5 x6 min  S2S x 10 bilateral knee tightening Shoulder Ext 10lb 2x10 Rows & Lats 25lb 2x10 Overhead lumbar Ext red ball 2x10   Bridges Bridges LE on Pball  bridges, K2C, Oblq    LE stretches HS, piriformis, Single K2C    IFC to L low back/glute area MHP lumbar spine  PATIENT EDUCATION:  Education details: POC, initial HEP Person educated: Patient Education method: Consulting civil engineer, Media planner, and Handouts Education comprehension: verbalized understanding and returned demonstration   HOME EXERCISE PROGRAM: Patient reports that he already performs DKTC, SKTC, B bridge, LTR,HS stretch against wall, standing calf stretch  [id number]  [id number]   ASSESSMENT:  CLINICAL IMPRESSION: Pt reports no real changes. HEP updated to include stability exercises.   OBJECTIVE IMPAIRMENTS: Abnormal gait, decreased activity tolerance, decreased balance, decreased coordination, decreased mobility, difficulty walking, decreased ROM, decreased strength, impaired flexibility, improper body mechanics, and pain.   ACTIVITY LIMITATIONS: lifting, bending, standing, squatting, sleeping, stairs, and locomotion level  PARTICIPATION LIMITATIONS: meal prep, cleaning, driving, shopping, community activity, and yard work  PERSONAL FACTORS: Past/current experiences are also affecting patient's functional outcome.   REHAB POTENTIAL: Good  CLINICAL DECISION MAKING: Stable/uncomplicated  EVALUATION COMPLEXITY: Moderate   GOALS: Goals reviewed with patient? Yes  SHORT TERM GOALS: Target date: 07/26/2022  I with basic HEP Baseline: Goal status: ongoing  LONG TERM GOALS: Target date: 09/20/22  I with final HEP Baseline:  Goal status: INITIAL  2.  Patient will score at least 52/56 on BERG Baseline: TBD Goal status: INITIAL  3.  Patient will be able to ambulate at least 1 mile with back pain < 3/10 Baseline: 8/10, limits walking. Goal status: onoging  4.  Patient will score a max of 8 on Owestry Disability scale to demonstrate significant improvement in back pain Baseline: 15 Goal status: INITIAL  5.  Increase L hip strength to at least 4/5 Baseline:  3+,4- Goal status: ongoing  PLAN: PT FREQUENCY: 2x/week  PT DURATION: 12 weeks  PLANNED INTERVENTIONS: Therapeutic exercises, Therapeutic activity, Neuromuscular re-education, Balance training, Gait training, Patient/Family education, Self Care, Joint mobilization, Stair training, Dry Needling, Electrical stimulation, Spinal mobilization, Cryotherapy, Moist heat, Traction, Ionotophoresis 4mg /ml Dexamethasone, and Manual therapy.  PLAN FOR NEXT SESSION: update HEP to include trunk stabilization   DPT 07/19/22 10:13 AM  07/19/2022, 10:13 AM

## 2022-07-21 ENCOUNTER — Ambulatory Visit: Payer: Medicare HMO | Admitting: Physical Therapy

## 2022-07-21 DIAGNOSIS — M6281 Muscle weakness (generalized): Secondary | ICD-10-CM | POA: Diagnosis not present

## 2022-07-21 DIAGNOSIS — R2681 Unsteadiness on feet: Secondary | ICD-10-CM

## 2022-07-21 DIAGNOSIS — M5416 Radiculopathy, lumbar region: Secondary | ICD-10-CM | POA: Diagnosis not present

## 2022-07-21 DIAGNOSIS — R600 Localized edema: Secondary | ICD-10-CM | POA: Diagnosis not present

## 2022-07-21 DIAGNOSIS — R293 Abnormal posture: Secondary | ICD-10-CM

## 2022-07-21 DIAGNOSIS — M5459 Other low back pain: Secondary | ICD-10-CM | POA: Diagnosis not present

## 2022-07-21 DIAGNOSIS — R278 Other lack of coordination: Secondary | ICD-10-CM | POA: Diagnosis not present

## 2022-07-21 DIAGNOSIS — R262 Difficulty in walking, not elsewhere classified: Secondary | ICD-10-CM

## 2022-07-21 DIAGNOSIS — R2689 Other abnormalities of gait and mobility: Secondary | ICD-10-CM | POA: Diagnosis not present

## 2022-07-21 NOTE — Therapy (Signed)
OUTPATIENT PHYSICAL THERAPY THORACOLUMBAR TREATMENT   Patient Name: Ricardo Stewart MRN: [id number] DOB:13-Nov-1945, 76 y.o., male Today's Date: 07/21/2022   PT End of Session - 07/21/22 1016     Visit Number 7    Date for PT Re-Evaluation 09/20/22    PT Start Time 1015    PT Stop Time 1055    PT Time Calculation (min) 40 min    Activity Tolerance Patient tolerated treatment well;Patient limited by pain    Behavior During Therapy [facility] for tasks assessed/performed               Past Medical History:  Diagnosis Date   Arthritis    hips and spine   Hypertension    Peripheral neuropathy    bilat   Past Surgical History:  Procedure Laterality Date   FRACTURE SURGERY Right    radius and collar bone   TOTAL HIP ARTHROPLASTY Right 07/19/2019   Procedure: RIGHT TOTAL HIP ARTHROPLASTY ANTERIOR APPROACH;  Surgeon: Kathryne Hitch, MD;  Location: [facility];  Service: Orthopedics;  Laterality: Right;   Patient Active Problem List   Diagnosis Date Noted   Status post total replacement of right hip 07/19/2019   Unilateral primary osteoarthritis, right hip 06/17/2019   Lumbar radiculopathy 09/24/2018   Foraminal stenosis of lumbar region 09/24/2018   Spondylosis without myelopathy or radiculopathy, lumbar region 08/29/2016   Chronic left-sided low back pain without sciatica 08/29/2016    PCP: Sigmund Hazel  REFERRING PROVIDER: Eldred Manges, MD   REFERRING DIAG: M54.16 (ICD-10-CM) - Lumbar radiculopathy M54.50,G89.29 (ICD-10-CM) - Chronic left-sided low back pain without sciatica   Rationale for Evaluation and Treatment Rehabilitation  THERAPY DIAG:  Abnormal posture  Difficulty in walking, not elsewhere classified  Localized edema  Muscle weakness (generalized)  Other abnormalities of gait and mobility  Other lack of coordination  Unsteadiness on feet  ONSET DATE: 06/10/2022   SUBJECTIVE:                                                                                                                                                                                            SUBJECTIVE STATEMENT: Patient reports that his piriformis feels tight.  PERTINENT HISTORY:  HPI 76 year old male with back problems that radiates in his left leg for several years.  Patient had previous right total hip arthroplasty by Dr. [name] and states about 9 months after his total hip he started having some gradual recurrence of his left leg symptoms.  He has had worsening pain for the last few months radiating into his buttocks and his left thigh.  He denies numbness.  He uses  an exercise bike and states walking is more difficult.  Some of his pain is located directly over the ischial tuberosity.   PAIN:  Are you having pain? Yes: NPRS scale: 6/10 Pain location: low back, buttock on L Pain description: shooting Aggravating factors: walking/standing Relieving factors: lying down and pain meds   PRECAUTIONS: None  WEIGHT BEARING RESTRICTIONS: No  FALLS:  Has patient fallen in last 6 months? No  LIVING ENVIRONMENT: Lives with: lives with their spouse Lives in: House/apartment Stairs: Yes: Internal: 14 steps; on right going up Has following equipment at home: None  OCCUPATION: Retired, likes to walk daily, has started riding a stationary bike more now.  PLOF: Independent  PATIENT GOALS: Be able to walk daily without pain   OBJECTIVE:   DIAGNOSTIC FINDINGS:  Imaging: IMPRESSION: 1. At L4-5 there is a broad-based disc bulge with a right lateral disc osteophyte complex. Moderate bilateral facet arthropathy. Moderate spinal stenosis and bilateral lateral recess stenosis. No left foraminal stenosis. Moderate right foraminal stenosis. 2. At L5-S1 there is a broad-based disc bulge eccentric towards the left. Severe bilateral facet arthropathy with ligamentum flavum infolding. Left lateral recess stenosis. Mild spinal stenosis. Severe left foraminal stenosis.  Mild right foraminal stenosis. 3 mm left facet posterior extra-spinal synovial cyst. 3. Interval resolution of large intraspinal synovial cyst at L5-S1 and a smaller intraspinal synovial cyst at L4-5.Previous MRI scan lumbar 2019 showed disc degeneration with moderate stenosis. He had left lateral recess stenosis at L5-S1 with severe foraminal stenosis. Synovial cyst have been present on previous MRI that had resolved by 2019. We will set patient up for some physical therapy. If he does not respond to therapy then we will consider repeat lumbar MRI imaging. PT at [facility] which is convenient for him.   Previous MRI scan lumbar 2019 showed disc degeneration with moderate stenosis. He had left lateral recess stenosis at L5-S1 with severe foraminal stenosis. Synovial cyst have been present on previous MRI that had resolved by 2019. We will set patient up for some physical therapy. If he does not respond to therapy then we will consider repeat lumbar MRI imaging. PT at [facility] which is convenient for him.    SCREENING FOR RED FLAGS: Bowel or bladder incontinence: No Spinal tumors: No Cauda equina syndrome: No Compression fracture: No Abdominal aneurysm: No  COGNITION:  Overall cognitive status: Within functional limits for tasks assessed     SENSATION: Light touch: WFL  MUSCLE LENGTH: Hamstrings: Right 45 deg; Left 45 deg Thomas test:Both sides appear tight  POSTURE: decreased lumbar lordosis and decreased thoracic kyphosis  PALPATION: TTP along L piriformis  LUMBAR ROM:   AROM eval  Flexion To toes  Extension Mildly limited  Right lateral flexion To knee  Left lateral flexion 3" above knee  Right rotation WNL  Left rotation WNL   (Blank rows = not tested)  LOWER EXTREMITY ROM:   Generally stiff throughout, but WFL  LOWER EXTREMITY MMT:  All other strength WNL in BLE  MMT Right eval Left eval  Hip flexion    Hip extension  4-  Hip abduction  3+  Hip adduction     Hip internal rotation    Hip external rotation    Knee flexion    Knee extension    Ankle dorsiflexion    Ankle plantarflexion    Ankle inversion    Ankle eversion     (Blank rows = not tested)  LUMBAR SPECIAL TESTS:  Straight leg raise  test: Negative and Slump test: Positive  FUNCTIONAL TESTS:  Berg Balance Scale: TBD  GAIT: Distance walked: 26' Assistive device utilized: None Level of assistance: Complete Independence Comments: Antalgic gait with favoring of LLE.    TODAY'S TREATMENT:  07/21/22 Recumbent bike L4 x 6 minutes Deep STM to L piriformis F/B passive stretch along muscle belly. Supine isometric hip add and abd 10 x 5 sec each direction Dead bug ball squeeze for abdominal activation 10 x 5 sec holds Dead bugs 10 reps each way, required rest breaks Lower trunk rotation, hold for deep breath at each end. SKTC 3 x 15 sec on each side. Seated forward lean over physioball, 4 x 15 straight, 4 x 15 sec to each side diagonally. Squats while holding 4# ball in BUE- 2 x 10 reps  07/19/22 NuStep L5 x 6 minutes Supine low back stretches including 3 way hip stretch with strap, ITB, glut, and piriformis stretches, HEP updated to include Self STM using tennis ball on L piriformis. Additional passive piriformis stretch Clamshells against G tband resistance, 2 x 10 reps Post pelvic tilts in supine- March while holding PPT   07/14/22 Recumbent bike L4 x 6 minutes Supine stretch with strap, HS, add, abd, 2 x 20 sec each leg Bridge with hip abd resistance from G tband, 10 reps Single leg bridge x 10 with each leg Dead bug, legs only, arms remain pointed to ceiling. Thomas stretch, 30 sec B Seated HS stretch, 30 sec each leg Standing hip flexor stretch against wall. Standing shoulder ext, rows, ER against G tband, 2 x 10 reps each Paloff press 20# 2 x 10 each side  07/12/22 NuStep L5  x 6 minutes Seated piriformis and ITB stretches, HS stretch Thomas stretch STM with  tennis ball to piriformis  Supine- Bridge with Tband abd resistance, clamshells, 10 reps each Dynamic stretching, butt kicks, frogger, hip flexor wall stretch, HS stretch.  07/07/22 NuStep L5 x 6 min LLE stretching HS, Piriformis, ITB, SKC, Lower trunk rotation 5x10'' STM lumbar spine Shoulder Ext 10lb 2x10 Seated rows & lats 35lb 2x10   IFC to L low back/glute area MHP lumbar spine  07/05/22 NuStep L5 x6 min  S2S x 10 bilateral knee tightening Shoulder Ext 10lb 2x10 Rows & Lats 25lb 2x10 Overhead lumbar Ext red ball 2x10   Bridges Bridges LE on Pball bridges, K2C, Oblq    LE stretches HS, piriformis, Single K2C    IFC to L low back/glute area MHP lumbar spine  PATIENT EDUCATION:  Education details: POC, initial HEP Person educated: Patient Education method: Programmer, multimedia, Facilities manager, and Handouts Education comprehension: verbalized understanding and returned demonstration   HOME EXERCISE PROGRAM: Patient reports that he already performs DKTC, SKTC, B bridge, LTR,HS stretch against wall, standing calf stretch  [id number]  [id number]   ASSESSMENT:  CLINICAL IMPRESSION: Pt reports no real changes. Performed deep tissue mobilization to L piriformis followed by stretch to same. Then worked on core and hip strengthening for improved back support.   OBJECTIVE IMPAIRMENTS: Abnormal gait, decreased activity tolerance, decreased balance, decreased coordination, decreased mobility, difficulty walking, decreased ROM, decreased strength, impaired flexibility, improper body mechanics, and pain.   ACTIVITY LIMITATIONS: lifting, bending, standing, squatting, sleeping, stairs, and locomotion level  PARTICIPATION LIMITATIONS: meal prep, cleaning, driving, shopping, community activity, and yard work  PERSONAL FACTORS: Past/current experiences are also affecting patient's functional outcome.   REHAB POTENTIAL: Good  CLINICAL DECISION MAKING: Stable/uncomplicated  EVALUATION  COMPLEXITY: Moderate   GOALS: Goals reviewed with  patient? Yes  SHORT TERM GOALS: Target date: 07/26/2022  I with basic HEP Baseline: Goal status: ongoing  LONG TERM GOALS: Target date: 09/20/22  I with final HEP Baseline:  Goal status: INITIAL  2.  Patient will score at least 52/56 on BERG Baseline: TBD Goal status: INITIAL  3.  Patient will be able to ambulate at least 1 mile with back pain < 3/10 Baseline: 8/10, limits walking. Goal status: onoging  4.  Patient will score a max of 8 on Owestry Disability scale to demonstrate significant improvement in back pain Baseline: 15 Goal status: INITIAL  5.  Increase L hip strength to at least 4/5 Baseline: 3+,4- Goal status: ongoing  PLAN: PT FREQUENCY: 2x/week  PT DURATION: 12 weeks  PLANNED INTERVENTIONS: Therapeutic exercises, Therapeutic activity, Neuromuscular re-education, Balance training, Gait training, Patient/Family education, Self Care, Joint mobilization, Stair training, Dry Needling, Electrical stimulation, Spinal mobilization, Cryotherapy, Moist heat, Traction, Ionotophoresis 4mg /ml Dexamethasone, and Manual therapy.  PLAN FOR NEXT SESSION: update HEP to include trunk stabilization   DPT 07/21/22 10:53 AM  07/21/2022, 10:53 AM

## 2022-07-26 ENCOUNTER — Encounter: Payer: Self-pay | Admitting: Physical Therapy

## 2022-07-26 ENCOUNTER — Ambulatory Visit: Payer: Medicare HMO | Admitting: Physical Therapy

## 2022-07-26 DIAGNOSIS — R2689 Other abnormalities of gait and mobility: Secondary | ICD-10-CM | POA: Diagnosis not present

## 2022-07-26 DIAGNOSIS — R600 Localized edema: Secondary | ICD-10-CM | POA: Diagnosis not present

## 2022-07-26 DIAGNOSIS — R262 Difficulty in walking, not elsewhere classified: Secondary | ICD-10-CM

## 2022-07-26 DIAGNOSIS — R278 Other lack of coordination: Secondary | ICD-10-CM | POA: Diagnosis not present

## 2022-07-26 DIAGNOSIS — M5459 Other low back pain: Secondary | ICD-10-CM | POA: Diagnosis not present

## 2022-07-26 DIAGNOSIS — R293 Abnormal posture: Secondary | ICD-10-CM

## 2022-07-26 DIAGNOSIS — R2681 Unsteadiness on feet: Secondary | ICD-10-CM | POA: Diagnosis not present

## 2022-07-26 DIAGNOSIS — M5416 Radiculopathy, lumbar region: Secondary | ICD-10-CM | POA: Diagnosis not present

## 2022-07-26 DIAGNOSIS — M6281 Muscle weakness (generalized): Secondary | ICD-10-CM

## 2022-07-26 NOTE — Therapy (Signed)
OUTPATIENT PHYSICAL THERAPY THORACOLUMBAR TREATMENT   Patient Name: Ricardo Stewart MRN: [id number] DOB:10/21/1945, 76 y.o., male Today's Date: 07/26/2022   PT End of Session - 07/26/22 0928     Visit Number 8    Date for PT Re-Evaluation 09/20/22    PT Start Time 0930    PT Stop Time 1015    PT Time Calculation (min) 45 min    Activity Tolerance Patient tolerated treatment well;Patient limited by pain    Behavior During Therapy [facility] for tasks assessed/performed               Past Medical History:  Diagnosis Date   Arthritis    hips and spine   Hypertension    Peripheral neuropathy    bilat   Past Surgical History:  Procedure Laterality Date   FRACTURE SURGERY Right    radius and collar bone   TOTAL HIP ARTHROPLASTY Right 07/19/2019   Procedure: RIGHT TOTAL HIP ARTHROPLASTY ANTERIOR APPROACH;  Surgeon: Kathryne Hitch, MD;  Location: [facility];  Service: Orthopedics;  Laterality: Right;   Patient Active Problem List   Diagnosis Date Noted   Status post total replacement of right hip 07/19/2019   Unilateral primary osteoarthritis, right hip 06/17/2019   Lumbar radiculopathy 09/24/2018   Foraminal stenosis of lumbar region 09/24/2018   Spondylosis without myelopathy or radiculopathy, lumbar region 08/29/2016   Chronic left-sided low back pain without sciatica 08/29/2016    PCP: Sigmund Hazel  REFERRING PROVIDER: Eldred Manges, MD   REFERRING DIAG: M54.16 (ICD-10-CM) - Lumbar radiculopathy M54.50,G89.29 (ICD-10-CM) - Chronic left-sided low back pain without sciatica   Rationale for Evaluation and Treatment Rehabilitation  THERAPY DIAG:  Abnormal posture  Difficulty in walking, not elsewhere classified  Localized edema  Muscle weakness (generalized)  ONSET DATE: 06/10/2022   SUBJECTIVE:                                                                                                                                                                                            SUBJECTIVE STATEMENT: Doing ok, some tightness in L piriformis area  PERTINENT HISTORY:  HPI 76 year old male with back problems that radiates in his left leg for several years.  Patient had previous right total hip arthroplasty by Dr. [name] and states about 9 months after his total hip he started having some gradual recurrence of his left leg symptoms.  He has had worsening pain for the last few months radiating into his buttocks and his left thigh.  He denies numbness.  He uses an exercise bike and states walking is more difficult.  Some of his pain is  located directly over the ischial tuberosity.   PAIN:  Are you having pain? Yes: NPRS scale: 6/10 Pain location: low back, buttock on L Pain description: shooting Aggravating factors: walking/standing Relieving factors: lying down and pain meds   PRECAUTIONS: None  WEIGHT BEARING RESTRICTIONS: No  FALLS:  Has patient fallen in last 6 months? No  LIVING ENVIRONMENT: Lives with: lives with their spouse Lives in: House/apartment Stairs: Yes: Internal: 14 steps; on right going up Has following equipment at home: None  OCCUPATION: Retired, likes to walk daily, has started riding a stationary bike more now.  PLOF: Independent  PATIENT GOALS: Be able to walk daily without pain   OBJECTIVE:   DIAGNOSTIC FINDINGS:  Imaging: IMPRESSION: 1. At L4-5 there is a broad-based disc bulge with a right lateral disc osteophyte complex. Moderate bilateral facet arthropathy. Moderate spinal stenosis and bilateral lateral recess stenosis. No left foraminal stenosis. Moderate right foraminal stenosis. 2. At L5-S1 there is a broad-based disc bulge eccentric towards the left. Severe bilateral facet arthropathy with ligamentum flavum infolding. Left lateral recess stenosis. Mild spinal stenosis. Severe left foraminal stenosis. Mild right foraminal stenosis. 3 mm left facet posterior extra-spinal synovial cyst. 3. Interval  resolution of large intraspinal synovial cyst at L5-S1 and a smaller intraspinal synovial cyst at L4-5.Previous MRI scan lumbar 2019 showed disc degeneration with moderate stenosis. He had left lateral recess stenosis at L5-S1 with severe foraminal stenosis. Synovial cyst have been present on previous MRI that had resolved by 2019. We will set patient up for some physical therapy. If he does not respond to therapy then we will consider repeat lumbar MRI imaging. PT at [facility] which is convenient for him.   Previous MRI scan lumbar 2019 showed disc degeneration with moderate stenosis. He had left lateral recess stenosis at L5-S1 with severe foraminal stenosis. Synovial cyst have been present on previous MRI that had resolved by 2019. We will set patient up for some physical therapy. If he does not respond to therapy then we will consider repeat lumbar MRI imaging. PT at [facility] which is convenient for him.    SCREENING FOR RED FLAGS: Bowel or bladder incontinence: No Spinal tumors: No Cauda equina syndrome: No Compression fracture: No Abdominal aneurysm: No  COGNITION:  Overall cognitive status: Within functional limits for tasks assessed     SENSATION: Light touch: WFL  MUSCLE LENGTH: Hamstrings: Right 45 deg; Left 45 deg Thomas test:Both sides appear tight  POSTURE: decreased lumbar lordosis and decreased thoracic kyphosis  PALPATION: TTP along L piriformis  LUMBAR ROM:   AROM eval  Flexion To toes  Extension Mildly limited  Right lateral flexion To knee  Left lateral flexion 3" above knee  Right rotation WNL  Left rotation WNL   (Blank rows = not tested)  LOWER EXTREMITY ROM:   Generally stiff throughout, but WFL  LOWER EXTREMITY MMT:  All other strength WNL in BLE  MMT Right eval Left eval  Hip flexion    Hip extension  4-  Hip abduction  3+  Hip adduction    Hip internal rotation    Hip external rotation    Knee flexion    Knee extension    Ankle  dorsiflexion    Ankle plantarflexion    Ankle inversion    Ankle eversion     (Blank rows = not tested)  LUMBAR SPECIAL TESTS:  Straight leg raise test: Negative and Slump test: Positive  FUNCTIONAL TESTS:  Berg Balance Scale: TBD  GAIT: Distance walked: 12' Assistive device utilized: None Level of assistance: Complete Independence Comments: Antalgic gait with favoring of LLE.    TODAY'S TREATMENT:  07/26/22 NuStep L 5 x 6 min Standing rows 10lb 2x10 Shoulder Ext 10lb 2x10 Seated lat pulls 35lb 2x10 Supine on heat pad LE stretching, HS, Glut, Piriformis, ITB  STM to L glut and lumbar spine area use of theragun  07/21/22 Recumbent bike L4 x 6 minutes Deep STM to L piriformis F/B passive stretch along muscle belly. Supine isometric hip add and abd 10 x 5 sec each direction Dead bug ball squeeze for abdominal activation 10 x 5 sec holds Dead bugs 10 reps each way, required rest breaks Lower trunk rotation, hold for deep breath at each end. SKTC 3 x 15 sec on each side. Seated forward lean over physioball, 4 x 15 straight, 4 x 15 sec to each side diagonally. Squats while holding 4# ball in BUE- 2 x 10 reps  07/19/22 NuStep L5 x 6 minutes Supine low back stretches including 3 way hip stretch with strap, ITB, glut, and piriformis stretches, HEP updated to include Self STM using tennis ball on L piriformis. Additional passive piriformis stretch Clamshells against G tband resistance, 2 x 10 reps Post pelvic tilts in supine- March while holding PPT   07/14/22 Recumbent bike L4 x 6 minutes Supine stretch with strap, HS, add, abd, 2 x 20 sec each leg Bridge with hip abd resistance from G tband, 10 reps Single leg bridge x 10 with each leg Dead bug, legs only, arms remain pointed to ceiling. Thomas stretch, 30 sec B Seated HS stretch, 30 sec each leg Standing hip flexor stretch against wall. Standing shoulder ext, rows, ER against G tband, 2 x 10 reps each Paloff press 20#  2 x 10 each side  07/12/22 NuStep L5  x 6 minutes Seated piriformis and ITB stretches, HS stretch Thomas stretch STM with tennis ball to piriformis  Supine- Bridge with Tband abd resistance, clamshells, 10 reps each Dynamic stretching, butt kicks, frogger, hip flexor wall stretch, HS stretch.  07/07/22 NuStep L5 x 6 min LLE stretching HS, Piriformis, ITB, SKC, Lower trunk rotation 5x10'' STM lumbar spine Shoulder Ext 10lb 2x10 Seated rows & lats 35lb 2x10   IFC to L low back/glute area MHP lumbar spine  07/05/22 NuStep L5 x6 min  S2S x 10 bilateral knee tightening Shoulder Ext 10lb 2x10 Rows & Lats 25lb 2x10 Overhead lumbar Ext red ball 2x10   Bridges Bridges LE on Pball bridges, K2C, Oblq    LE stretches HS, piriformis, Single K2C    IFC to L low back/glute area MHP lumbar spine  PATIENT EDUCATION:  Education details: POC, initial HEP Person educated: Patient Education method: Programmer, multimedia, Facilities manager, and Handouts Education comprehension: verbalized understanding and returned demonstration   HOME EXERCISE PROGRAM: Patient reports that he already performs DKTC, SKTC, B bridge, LTR,HS stretch against wall, standing calf stretch  [id number]  [id number]   ASSESSMENT:  CLINICAL IMPRESSION: Pt reports no real changes. Added some postural strengthening to today session. Performed stretching to lumbar spine and LE. STM to L glut and low back with use of thea gun. Pt stated deep tissue mobilization causes increase in symptome afterwards.    OBJECTIVE IMPAIRMENTS: Abnormal gait, decreased activity tolerance, decreased balance, decreased coordination, decreased mobility, difficulty walking, decreased ROM, decreased strength, impaired flexibility, improper body mechanics, and pain.   ACTIVITY LIMITATIONS: lifting, bending, standing, squatting, sleeping, stairs, and locomotion level  PARTICIPATION LIMITATIONS:  meal prep, cleaning, driving, shopping, community activity, and yard  work  PERSONAL FACTORS: Past/current experiences are also affecting patient's functional outcome.   REHAB POTENTIAL: Good  CLINICAL DECISION MAKING: Stable/uncomplicated  EVALUATION COMPLEXITY: Moderate   GOALS: Goals reviewed with patient? Yes  SHORT TERM GOALS: Target date: 07/26/2022  I with basic HEP Baseline: Goal status: ongoing  LONG TERM GOALS: Target date: 09/20/22  I with final HEP Baseline:  Goal status: INITIAL  2.  Patient will score at least 52/56 on BERG Baseline: TBD Goal status: INITIAL  3.  Patient will be able to ambulate at least 1 mile with back pain < 3/10 Baseline: 8/10, limits walking. Goal status: onoging  4.  Patient will score a max of 8 on Owestry Disability scale to demonstrate significant improvement in back pain Baseline: 15 Goal status: INITIAL  5.  Increase L hip strength to at least 4/5 Baseline: 3+,4- Goal status: ongoing  PLAN: PT FREQUENCY: 2x/week  PT DURATION: 12 weeks  PLANNED INTERVENTIONS: Therapeutic exercises, Therapeutic activity, Neuromuscular re-education, Balance training, Gait training, Patient/Family education, Self Care, Joint mobilization, Stair training, Dry Needling, Electrical stimulation, Spinal mobilization, Cryotherapy, Moist heat, Traction, Ionotophoresis 4mg /ml Dexamethasone, and Manual therapy.  PLAN FOR NEXT SESSION: update HEP to include trunk stabilization   PTA 07/26/22 9:28 AM  07/26/2022, 9:28 AM

## 2022-07-28 ENCOUNTER — Encounter: Payer: Self-pay | Admitting: Physical Therapy

## 2022-07-28 ENCOUNTER — Ambulatory Visit: Payer: Medicare HMO | Admitting: Physical Therapy

## 2022-07-28 DIAGNOSIS — R262 Difficulty in walking, not elsewhere classified: Secondary | ICD-10-CM

## 2022-07-28 DIAGNOSIS — M5459 Other low back pain: Secondary | ICD-10-CM | POA: Diagnosis not present

## 2022-07-28 DIAGNOSIS — M5416 Radiculopathy, lumbar region: Secondary | ICD-10-CM | POA: Diagnosis not present

## 2022-07-28 DIAGNOSIS — R600 Localized edema: Secondary | ICD-10-CM | POA: Diagnosis not present

## 2022-07-28 DIAGNOSIS — R293 Abnormal posture: Secondary | ICD-10-CM

## 2022-07-28 DIAGNOSIS — R278 Other lack of coordination: Secondary | ICD-10-CM | POA: Diagnosis not present

## 2022-07-28 DIAGNOSIS — M6281 Muscle weakness (generalized): Secondary | ICD-10-CM

## 2022-07-28 DIAGNOSIS — R2681 Unsteadiness on feet: Secondary | ICD-10-CM | POA: Diagnosis not present

## 2022-07-28 DIAGNOSIS — R2689 Other abnormalities of gait and mobility: Secondary | ICD-10-CM | POA: Diagnosis not present

## 2022-07-28 NOTE — Therapy (Signed)
OUTPATIENT PHYSICAL THERAPY THORACOLUMBAR TREATMENT   Patient Name: Ricardo Stewart MRN: [id number] DOB:02/02/1946, 76 y.o., male Today's Date: 07/28/2022   PT End of Session - 07/28/22 1256     Visit Number 9    Date for PT Re-Evaluation 09/20/22    PT Start Time 1300    PT Stop Time 1345    PT Time Calculation (min) 45 min    Activity Tolerance Patient tolerated treatment well;Patient limited by pain    Behavior During Therapy [facility] for tasks assessed/performed               Past Medical History:  Diagnosis Date   Arthritis    hips and spine   Hypertension    Peripheral neuropathy    bilat   Past Surgical History:  Procedure Laterality Date   FRACTURE SURGERY Right    radius and collar bone   TOTAL HIP ARTHROPLASTY Right 07/19/2019   Procedure: RIGHT TOTAL HIP ARTHROPLASTY ANTERIOR APPROACH;  Surgeon: Kathryne Hitch, MD;  Location: [facility];  Service: Orthopedics;  Laterality: Right;   Patient Active Problem List   Diagnosis Date Noted   Status post total replacement of right hip 07/19/2019   Unilateral primary osteoarthritis, right hip 06/17/2019   Lumbar radiculopathy 09/24/2018   Foraminal stenosis of lumbar region 09/24/2018   Spondylosis without myelopathy or radiculopathy, lumbar region 08/29/2016   Chronic left-sided low back pain without sciatica 08/29/2016    PCP: Sigmund Hazel  REFERRING PROVIDER: Eldred Manges, MD   REFERRING DIAG: M54.16 (ICD-10-CM) - Lumbar radiculopathy M54.50,G89.29 (ICD-10-CM) - Chronic left-sided low back pain without sciatica   Rationale for Evaluation and Treatment Rehabilitation  THERAPY DIAG:  Abnormal posture  Difficulty in walking, not elsewhere classified  Localized edema  Muscle weakness (generalized)  ONSET DATE: 06/10/2022   SUBJECTIVE:                                                                                                                                                                                            SUBJECTIVE STATEMENT: Got a little bit going on. Pt referring to pain in the L glute area  PERTINENT HISTORY:  HPI 77 year old male with back problems that radiates in his left leg for several years.  Patient had previous right total hip arthroplasty by Dr. [name] and states about 9 months after his total hip he started having some gradual recurrence of his left leg symptoms.  He has had worsening pain for the last few months radiating into his buttocks and his left thigh.  He denies numbness.  He uses an exercise bike and states walking is more  difficult.  Some of his pain is located directly over the ischial tuberosity.   PAIN:  Are you having pain? Yes: NPRS scale: 4/10 Pain location: low back, buttock on L Pain description: shooting Aggravating factors: walking/standing Relieving factors: lying down and pain meds   PRECAUTIONS: None  WEIGHT BEARING RESTRICTIONS: No  FALLS:  Has patient fallen in last 6 months? No  LIVING ENVIRONMENT: Lives with: lives with their spouse Lives in: House/apartment Stairs: Yes: Internal: 14 steps; on right going up Has following equipment at home: None  OCCUPATION: Retired, likes to walk daily, has started riding a stationary bike more now.  PLOF: Independent  PATIENT GOALS: Be able to walk daily without pain   OBJECTIVE:   DIAGNOSTIC FINDINGS:  Imaging: IMPRESSION: 1. At L4-5 there is a broad-based disc bulge with a right lateral disc osteophyte complex. Moderate bilateral facet arthropathy. Moderate spinal stenosis and bilateral lateral recess stenosis. No left foraminal stenosis. Moderate right foraminal stenosis. 2. At L5-S1 there is a broad-based disc bulge eccentric towards the left. Severe bilateral facet arthropathy with ligamentum flavum infolding. Left lateral recess stenosis. Mild spinal stenosis. Severe left foraminal stenosis. Mild right foraminal stenosis. 3 mm left facet posterior extra-spinal  synovial cyst. 3. Interval resolution of large intraspinal synovial cyst at L5-S1 and a smaller intraspinal synovial cyst at L4-5.Previous MRI scan lumbar 2019 showed disc degeneration with moderate stenosis. He had left lateral recess stenosis at L5-S1 with severe foraminal stenosis. Synovial cyst have been present on previous MRI that had resolved by 2019. We will set patient up for some physical therapy. If he does not respond to therapy then we will consider repeat lumbar MRI imaging. PT at [facility] which is convenient for him.   Previous MRI scan lumbar 2019 showed disc degeneration with moderate stenosis. He had left lateral recess stenosis at L5-S1 with severe foraminal stenosis. Synovial cyst have been present on previous MRI that had resolved by 2019. We will set patient up for some physical therapy. If he does not respond to therapy then we will consider repeat lumbar MRI imaging. PT at [facility] which is convenient for him.    SCREENING FOR RED FLAGS: Bowel or bladder incontinence: No Spinal tumors: No Cauda equina syndrome: No Compression fracture: No Abdominal aneurysm: No  COGNITION:  Overall cognitive status: Within functional limits for tasks assessed     SENSATION: Light touch: WFL  MUSCLE LENGTH: Hamstrings: Right 45 deg; Left 45 deg Thomas test:Both sides appear tight  POSTURE: decreased lumbar lordosis and decreased thoracic kyphosis  PALPATION: TTP along L piriformis  LUMBAR ROM:   AROM eval  Flexion To toes  Extension Mildly limited  Right lateral flexion To knee  Left lateral flexion 3" above knee  Right rotation WNL  Left rotation WNL   (Blank rows = not tested)  LOWER EXTREMITY ROM:   Generally stiff throughout, but WFL  LOWER EXTREMITY MMT:  All other strength WNL in BLE  MMT Right eval Left eval  Hip flexion    Hip extension  4-  Hip abduction  3+  Hip adduction    Hip internal rotation    Hip external rotation    Knee flexion     Knee extension    Ankle dorsiflexion    Ankle plantarflexion    Ankle inversion    Ankle eversion     (Blank rows = not tested)  LUMBAR SPECIAL TESTS:  Straight leg raise test: Negative and Slump test: Positive  FUNCTIONAL  TESTS:  Berg Balance Scale: TBD  GAIT: Distance walked: 580' Assistive device utilized: None Level of assistance: Complete Independence Comments: Antalgic gait with favoring of LLE.    TODAY'S TREATMENT:  07/28/22 NuStep L5 x 6 min Hamstring curls 35lb 2x10 Leg Ext 10lb 2x10 Hip ext & abd 5lb x10 each  Leg press 30lb 2x15 6in step ups x 10 each  Supine on heat pad LE stretching, HS, Glut, Piriformis, ITB Bridges 2x10 STM to L glut and lumbar spine area use of theragun  07/26/22 NuStep L 5 x 6 min Standing rows 10lb 2x10 Shoulder Ext 10lb 2x10 Seated lat pulls 35lb 2x10 Supine on heat pad LE stretching, HS, Glut, Piriformis, ITB  STM to L glut and lumbar spine area use of theragun  07/21/22 Recumbent bike L4 x 6 minutes Deep STM to L piriformis F/B passive stretch along muscle belly. Supine isometric hip add and abd 10 x 5 sec each direction Dead bug ball squeeze for abdominal activation 10 x 5 sec holds Dead bugs 10 reps each way, required rest breaks Lower trunk rotation, hold for deep breath at each end. SKTC 3 x 15 sec on each side. Seated forward lean over physioball, 4 x 15 straight, 4 x 15 sec to each side diagonally. Squats while holding 4# ball in BUE- 2 x 10 reps  07/19/22 NuStep L5 x 6 minutes Supine low back stretches including 3 way hip stretch with strap, ITB, glut, and piriformis stretches, HEP updated to include Self STM using tennis ball on L piriformis. Additional passive piriformis stretch Clamshells against G tband resistance, 2 x 10 reps Post pelvic tilts in supine- March while holding PPT   07/14/22 Recumbent bike L4 x 6 minutes Supine stretch with strap, HS, add, abd, 2 x 20 sec each leg Bridge with hip abd  resistance from G tband, 10 reps Single leg bridge x 10 with each leg Dead bug, legs only, arms remain pointed to ceiling. Thomas stretch, 30 sec B Seated HS stretch, 30 sec each leg Standing hip flexor stretch against wall. Standing shoulder ext, rows, ER against G tband, 2 x 10 reps each Paloff press 20# 2 x 10 each side  07/12/22 NuStep L5  x 6 minutes Seated piriformis and ITB stretches, HS stretch Thomas stretch STM with tennis ball to piriformis  Supine- Bridge with Tband abd resistance, clamshells, 10 reps each Dynamic stretching, butt kicks, frogger, hip flexor wall stretch, HS stretch.  07/07/22 NuStep L5 x 6 min LLE stretching HS, Piriformis, ITB, SKC, Lower trunk rotation 5x10'' STM lumbar spine Shoulder Ext 10lb 2x10 Seated rows & lats 35lb 2x10   IFC to L low back/glute area MHP lumbar spine  07/05/22 NuStep L5 x6 min  S2S x 10 bilateral knee tightening Shoulder Ext 10lb 2x10 Rows & Lats 25lb 2x10 Overhead lumbar Ext red ball 2x10   Bridges Bridges LE on Pball bridges, K2C, Oblq    LE stretches HS, piriformis, Single K2C    IFC to L low back/glute area MHP lumbar spine  PATIENT EDUCATION:  Education details: POC, initial HEP Person educated: Patient Education method: Programmer, multimediaxplanation, Facilities managerDemonstration, and Handouts Education comprehension: verbalized understanding and returned demonstration   HOME EXERCISE PROGRAM: Patient reports that he already performs DKTC, SKTC, B bridge, LTR,HS stretch against wall, standing calf stretch  [id number]  [id number]   ASSESSMENT:  CLINICAL IMPRESSION: Pt entered doing ok, Session focused postural strength with functional and machine level interventions. Limited Hip ROM noted with extension and abduction.  L hip tightness noted with passive stretching. Some instability with step ups. Pt reported that his balance was not good.    OBJECTIVE IMPAIRMENTS: Abnormal gait, decreased activity tolerance, decreased balance, decreased  coordination, decreased mobility, difficulty walking, decreased ROM, decreased strength, impaired flexibility, improper body mechanics, and pain.   ACTIVITY LIMITATIONS: lifting, bending, standing, squatting, sleeping, stairs, and locomotion level  PARTICIPATION LIMITATIONS: meal prep, cleaning, driving, shopping, community activity, and yard work  PERSONAL FACTORS: Past/current experiences are also affecting patient's functional outcome.   REHAB POTENTIAL: Good  CLINICAL DECISION MAKING: Stable/uncomplicated  EVALUATION COMPLEXITY: Moderate   GOALS: Goals reviewed with patient? Yes  SHORT TERM GOALS: Target date: 07/26/2022  I with basic HEP Baseline: Goal status: ongoing  LONG TERM GOALS: Target date: 09/20/22  I with final HEP Baseline:  Goal status: INITIAL  2.  Patient will score at least 52/56 on BERG Baseline: TBD Goal status: INITIAL  3.  Patient will be able to ambulate at least 1 mile with back pain < 3/10 Baseline: 8/10, limits walking. Goal status: onoging  4.  Patient will score a max of 8 on Owestry Disability scale to demonstrate significant improvement in back pain Baseline: 15 Goal status: INITIAL  5.  Increase L hip strength to at least 4/5 Baseline: 3+,4- Goal status: ongoing  PLAN: PT FREQUENCY: 2x/week  PT DURATION: 12 weeks  PLANNED INTERVENTIONS: Therapeutic exercises, Therapeutic activity, Neuromuscular re-education, Balance training, Gait training, Patient/Family education, Self Care, Joint mobilization, Stair training, Dry Needling, Electrical stimulation, Spinal mobilization, Cryotherapy, Moist heat, Traction, Ionotophoresis 4mg /ml Dexamethasone, and Manual therapy.  PLAN FOR NEXT SESSION: update HEP to include trunk stabilization   PTA 07/28/22 12:56 PM  07/28/2022, 12:56 PM

## 2022-08-02 ENCOUNTER — Ambulatory Visit: Payer: Medicare HMO | Admitting: Physical Therapy

## 2022-08-02 ENCOUNTER — Encounter: Payer: Self-pay | Admitting: Physical Therapy

## 2022-08-02 DIAGNOSIS — R2681 Unsteadiness on feet: Secondary | ICD-10-CM | POA: Diagnosis not present

## 2022-08-02 DIAGNOSIS — R2689 Other abnormalities of gait and mobility: Secondary | ICD-10-CM

## 2022-08-02 DIAGNOSIS — R293 Abnormal posture: Secondary | ICD-10-CM | POA: Diagnosis not present

## 2022-08-02 DIAGNOSIS — M5459 Other low back pain: Secondary | ICD-10-CM | POA: Diagnosis not present

## 2022-08-02 DIAGNOSIS — R600 Localized edema: Secondary | ICD-10-CM | POA: Diagnosis not present

## 2022-08-02 DIAGNOSIS — M5416 Radiculopathy, lumbar region: Secondary | ICD-10-CM

## 2022-08-02 DIAGNOSIS — R262 Difficulty in walking, not elsewhere classified: Secondary | ICD-10-CM

## 2022-08-02 DIAGNOSIS — R278 Other lack of coordination: Secondary | ICD-10-CM

## 2022-08-02 DIAGNOSIS — M6281 Muscle weakness (generalized): Secondary | ICD-10-CM

## 2022-08-02 NOTE — Therapy (Signed)
OUTPATIENT PHYSICAL THERAPY THORACOLUMBAR TREATMENT   Patient Name: Ricardo Stewart MRN: [id number] DOB:10/07/1945, 76 y.o., male Today's Date: 08/02/2022  Progress Note Reporting Period 06/28/22 to 08/02/22  See note below for Objective Data and Assessment of Progress/Goals.       PT End of Session - 08/02/22 1010     Visit Number 10    Date for PT Re-Evaluation 09/20/22    PT Start Time 0930    PT Stop Time 1013    PT Time Calculation (min) 43 min    Activity Tolerance Patient tolerated treatment well;Patient limited by pain    Behavior During Therapy WFL for tasks assessed/performed               Past Medical History:  Diagnosis Date   Arthritis    hips and spine   Hypertension    Peripheral neuropathy    bilat   Past Surgical History:  Procedure Laterality Date   FRACTURE SURGERY Right    radius and collar bone   TOTAL HIP ARTHROPLASTY Right 07/19/2019   Procedure: RIGHT TOTAL HIP ARTHROPLASTY ANTERIOR APPROACH;  Surgeon: Kathryne Hitch, MD;  Location: [facility];  Service: Orthopedics;  Laterality: Right;   Patient Active Problem List   Diagnosis Date Noted   Status post total replacement of right hip 07/19/2019   Unilateral primary osteoarthritis, right hip 06/17/2019   Lumbar radiculopathy 09/24/2018   Foraminal stenosis of lumbar region 09/24/2018   Spondylosis without myelopathy or radiculopathy, lumbar region 08/29/2016   Chronic left-sided low back pain without sciatica 08/29/2016    PCP: Sigmund Hazel  REFERRING PROVIDER: Eldred Manges, MD   REFERRING DIAG: M54.16 (ICD-10-CM) - Lumbar radiculopathy M54.50,G89.29 (ICD-10-CM) - Chronic left-sided low back pain without sciatica   Rationale for Evaluation and Treatment Rehabilitation  THERAPY DIAG:  Abnormal posture  Difficulty in walking, not elsewhere classified  Localized edema  Muscle weakness (generalized)  Other abnormalities of gait and mobility  Other lack of  coordination  Unsteadiness on feet  Other low back pain  Lumbar radiculopathy  ONSET DATE: 06/10/2022   SUBJECTIVE:                                                                                                                                                                                           SUBJECTIVE STATEMENT: Got a little bit going on. Pt referring to pain in the L glute area  PERTINENT HISTORY:  HPI 76 year old male with back problems that radiates in his left leg for several years.  Patient had previous right total hip arthroplasty by Dr. [name] and states about 9 months after his  total hip he started having some gradual recurrence of his left leg symptoms.  He has had worsening pain for the last few months radiating into his buttocks and his left thigh.  He denies numbness.  He uses an exercise bike and states walking is more difficult.  Some of his pain is located directly over the ischial tuberosity.   PAIN:  Are you having pain? Yes: NPRS scale: 4/10 Pain location: low back, buttock on L Pain description: shooting Aggravating factors: walking/standing Relieving factors: lying down and pain meds   PRECAUTIONS: None  WEIGHT BEARING RESTRICTIONS: No  FALLS:  Has patient fallen in last 6 months? No  LIVING ENVIRONMENT: Lives with: lives with their spouse Lives in: House/apartment Stairs: Yes: Internal: 14 steps; on right going up Has following equipment at home: None  OCCUPATION: Retired, likes to walk daily, has started riding a stationary bike more now.  PLOF: Independent  PATIENT GOALS: Be able to walk daily without pain   OBJECTIVE:   DIAGNOSTIC FINDINGS:  Imaging: IMPRESSION: 1. At L4-5 there is a broad-based disc bulge with a right lateral disc osteophyte complex. Moderate bilateral facet arthropathy. Moderate spinal stenosis and bilateral lateral recess stenosis. No left foraminal stenosis. Moderate right foraminal stenosis. 2. At L5-S1  there is a broad-based disc bulge eccentric towards the left. Severe bilateral facet arthropathy with ligamentum flavum infolding. Left lateral recess stenosis. Mild spinal stenosis. Severe left foraminal stenosis. Mild right foraminal stenosis. 3 mm left facet posterior extra-spinal synovial cyst. 3. Interval resolution of large intraspinal synovial cyst at L5-S1 and a smaller intraspinal synovial cyst at L4-5.Previous MRI scan lumbar 2019 showed disc degeneration with moderate stenosis. He had left lateral recess stenosis at L5-S1 with severe foraminal stenosis. Synovial cyst have been present on previous MRI that had resolved by 2019. We will set patient up for some physical therapy. If he does not respond to therapy then we will consider repeat lumbar MRI imaging. PT at [facility] which is convenient for him.   Previous MRI scan lumbar 2019 showed disc degeneration with moderate stenosis. He had left lateral recess stenosis at L5-S1 with severe foraminal stenosis. Synovial cyst have been present on previous MRI that had resolved by 2019. We will set patient up for some physical therapy. If he does not respond to therapy then we will consider repeat lumbar MRI imaging. PT at [facility] which is convenient for him.    SCREENING FOR RED FLAGS: Bowel or bladder incontinence: No Spinal tumors: No Cauda equina syndrome: No Compression fracture: No Abdominal aneurysm: No  COGNITION:  Overall cognitive status: Within functional limits for tasks assessed     SENSATION: Light touch: WFL  MUSCLE LENGTH: Hamstrings: Right 45 deg; Left 45 deg Thomas test:Both sides appear tight  POSTURE: decreased lumbar lordosis and decreased thoracic kyphosis  PALPATION: TTP along L piriformis  LUMBAR ROM:   AROM eval  Flexion To toes  Extension Mildly limited  Right lateral flexion To knee  Left lateral flexion 3" above knee  Right rotation WNL  Left rotation WNL   (Blank rows = not  tested)  LOWER EXTREMITY ROM:   Generally stiff throughout, but WFL  LOWER EXTREMITY MMT:  All other strength WNL in BLE  MMT Right eval Left eval  Hip flexion    Hip extension  4-  Hip abduction  3+  Hip adduction    Hip internal rotation    Hip external rotation    Knee flexion  Knee extension    Ankle dorsiflexion    Ankle plantarflexion    Ankle inversion    Ankle eversion     (Blank rows = not tested)  LUMBAR SPECIAL TESTS:  Straight leg raise test: Negative and Slump test: Positive  FUNCTIONAL TESTS:  Berg Balance Scale: TBD  GAIT: Distance walked: 64' Assistive device utilized: None Level of assistance: Complete Independence Comments: Antalgic gait with favoring of LLE.    TODAY'S TREATMENT:  08/02/22 Bike L3 x 6 minutes B HS PROM assessment- R 54, L 48 Supine over physioball, bridge, bridge with hip IR, x 10 each, bridge and roll ball side to side x 10, Dead bugs, 2 x 5 sets Heel raises on step, 2 x 10 Step ups, forward, side, crossover, 5 reps each   07/28/22 NuStep L5 x 6 min Hamstring curls 35lb 2x10 Leg Ext 10lb 2x10 Hip ext & abd 5lb x10 each  Leg press 30lb 2x15 6in step ups x 10 each  Supine on heat pad LE stretching, HS, Glut, Piriformis, ITB Bridges 2x10 STM to L glut and lumbar spine area use of theragun  07/26/22 NuStep L 5 x 6 min Standing rows 10lb 2x10 Shoulder Ext 10lb 2x10 Seated lat pulls 35lb 2x10 Supine on heat pad LE stretching, HS, Glut, Piriformis, ITB  STM to L glut and lumbar spine area use of theragun  07/21/22 Recumbent bike L4 x 6 minutes Deep STM to L piriformis F/B passive stretch along muscle belly. Supine isometric hip add and abd 10 x 5 sec each direction Dead bug ball squeeze for abdominal activation 10 x 5 sec holds Dead bugs 10 reps each way, required rest breaks Lower trunk rotation, hold for deep breath at each end. SKTC 3 x 15 sec on each side. Seated forward lean over physioball, 4 x 15 straight,  4 x 15 sec to each side diagonally. Squats while holding 4# ball in BUE- 2 x 10 reps  07/19/22 NuStep L5 x 6 minutes Supine low back stretches including 3 way hip stretch with strap, ITB, glut, and piriformis stretches, HEP updated to include Self STM using tennis ball on L piriformis. Additional passive piriformis stretch Clamshells against G tband resistance, 2 x 10 reps Post pelvic tilts in supine- March while holding PPT   07/14/22 Recumbent bike L4 x 6 minutes Supine stretch with strap, HS, add, abd, 2 x 20 sec each leg Bridge with hip abd resistance from G tband, 10 reps Single leg bridge x 10 with each leg Dead bug, legs only, arms remain pointed to ceiling. Thomas stretch, 30 sec B Seated HS stretch, 30 sec each leg Standing hip flexor stretch against wall. Standing shoulder ext, rows, ER against G tband, 2 x 10 reps each Paloff press 20# 2 x 10 each side  07/12/22 NuStep L5  x 6 minutes Seated piriformis and ITB stretches, HS stretch Thomas stretch STM with tennis ball to piriformis  Supine- Bridge with Tband abd resistance, clamshells, 10 reps each Dynamic stretching, butt kicks, frogger, hip flexor wall stretch, HS stretch.  07/07/22 NuStep L5 x 6 min LLE stretching HS, Piriformis, ITB, SKC, Lower trunk rotation 5x10'' STM lumbar spine Shoulder Ext 10lb 2x10 Seated rows & lats 35lb 2x10   IFC to L low back/glute area MHP lumbar spine  07/05/22 NuStep L5 x6 min  S2S x 10 bilateral knee tightening Shoulder Ext 10lb 2x10 Rows & Lats 25lb 2x10 Overhead lumbar Ext red ball 2x10   Adella Nissen LE on  Pball bridges, K2C, Oblq    LE stretches HS, piriformis, Single K2C    IFC to L low back/glute area MHP lumbar spine  PATIENT EDUCATION:  Education details: POC, initial HEP Person educated: Patient Education method: Programmer, multimediaxplanation, Demonstration, and Handouts Education comprehension: verbalized understanding and returned demonstration   HOME EXERCISE  PROGRAM: Patient reports that he already performs DKTC, SKTC, B bridge, LTR,HS stretch against wall, standing calf stretch  [id number]  [id number]   ASSESSMENT:  CLINICAL IMPRESSION: Pt entered doing ok. Functional status re-assessed for PN. HEP updated to include additional strengthening for stability to control pain.   OBJECTIVE IMPAIRMENTS: Abnormal gait, decreased activity tolerance, decreased balance, decreased coordination, decreased mobility, difficulty walking, decreased ROM, decreased strength, impaired flexibility, improper body mechanics, and pain.   ACTIVITY LIMITATIONS: lifting, bending, standing, squatting, sleeping, stairs, and locomotion level  PARTICIPATION LIMITATIONS: meal prep, cleaning, driving, shopping, community activity, and yard work  PERSONAL FACTORS: Past/current experiences are also affecting patient's functional outcome.   REHAB POTENTIAL: Good  CLINICAL DECISION MAKING: Stable/uncomplicated  EVALUATION COMPLEXITY: Moderate   GOALS: Goals reviewed with patient? Yes  SHORT TERM GOALS: Target date: 07/26/2022  I with basic HEP Baseline: Goal status: ongoing  LONG TERM GOALS: Target date: 09/20/22  I with final HEP Baseline:  Goal status:ongoing  2.  Patient will score at least 52/56 on BERG Baseline: TBD Goal status: ongoing  3.  Patient will be able to ambulate at least 1 mile with back pain < 3/10 Baseline: 8/10, limits walking. 11/21- Patient has been riding his bike more than walking. Goal status: onoging  4.  Patient will score a max of 8 on Owestry Disability scale to demonstrate significant improvement in back pain Baseline: 15 Goal status: ongoing  5.  Increase L hip strength to at least 4/5 Baseline: 3+,4-, 11/21-4- Goal status: ongoing  PLAN: PT FREQUENCY: 2x/week  PT DURATION: 12 weeks  PLANNED INTERVENTIONS: Therapeutic exercises, Therapeutic activity, Neuromuscular re-education, Balance training, Gait training,  Patient/Family education, Self Care, Joint mobilization, Stair training, Dry Needling, Electrical stimulation, Spinal mobilization, Cryotherapy, Moist heat, Traction, Ionotophoresis 4mg /ml Dexamethasone, and Manual therapy.  PLAN FOR NEXT SESSION: update HEP to include trunk stabilization  Oley BalmSusan Lachlan Mckim DPT 08/02/22 12:09 PM  08/02/22 12:09 PM  08/02/2022, 12:09 PM

## 2022-08-10 ENCOUNTER — Ambulatory Visit: Payer: Medicare HMO | Admitting: Orthopaedic Surgery

## 2022-08-10 ENCOUNTER — Encounter: Payer: Self-pay | Admitting: Orthopaedic Surgery

## 2022-08-10 VITALS — BP 160/77 | Ht 68.0 in | Wt 215.0 lb

## 2022-08-10 DIAGNOSIS — M48061 Spinal stenosis, lumbar region without neurogenic claudication: Secondary | ICD-10-CM | POA: Diagnosis not present

## 2022-08-10 DIAGNOSIS — M47816 Spondylosis without myelopathy or radiculopathy, lumbar region: Secondary | ICD-10-CM

## 2022-08-10 NOTE — Progress Notes (Signed)
Office Visit Note   [name]           Date of Birth: [birth year]           MRN: [id number] Visit Date: 08/10/2022              Requested by: Sigmund Hazel, MD [address] [place],  [place] [address] PCP: Sigmund Hazel, MD   Assessment & Plan: Visit Diagnoses:  1. Foraminal stenosis of lumbar region   2. Spondylosis without myelopathy or radiculopathy, lumbar region     Plan: Patient is having increased problems with back and left leg pain.  We will proceed with MRI he has had history of intraspinal extra dural facet cyst causing compressions in the past.  Office follow-up after lumbar MRI.  Follow-Up Instructions: No follow-ups on file.   Orders:  Orders Placed This Encounter  Procedures   MR Lumbar Spine w/o contrast   No orders of the defined types were placed in this encounter.     Procedures: No procedures performed   Clinical Data: No additional findings.   Subjective: Chief Complaint  Patient presents with   Lower Back - Pain, Follow-up   Left Leg - Pain, Follow-up    HPI 76 year old male returns for follow-up.  He has been going to therapy and feels like he is gotten some improvement but thinks is less than 50%.  Improvement in strength and flexibility.  He continues to have back pain issues that radiate more into the left than right.  Previous right total hip arthroplasty 2020 doing well by Dr. Allie Bossier.  Previous lumbar MRI 4 years ago showed foraminal stenosis.  Synovial cyst at L5-S1 had resolved in 2019.  Patient had moderate stenosis at L4-5.  Patient's not having claudication symptoms currently.  Review of Systems all systems updated unchanged.   Objective: Vital Signs: BP (!) 160/77   Ht 5\' 8"  (1.727 m)   Wt 215 lb (97.5 kg)   BMI 32.69 kg/m   Physical Exam Constitutional:      Appearance: He is well-developed.  HENT:     Head: Normocephalic and atraumatic.     Right Ear: External ear normal.     Left Ear: External  ear normal.  Eyes:     Pupils: Pupils are equal, round, and reactive to light.  Neck:     Thyroid: No thyromegaly.     Trachea: No tracheal deviation.  Cardiovascular:     Rate and Rhythm: Normal rate.  Pulmonary:     Effort: Pulmonary effort is normal.     Breath sounds: No wheezing.  Abdominal:     General: Bowel sounds are normal.     Palpations: Abdomen is soft.  Musculoskeletal:     Cervical back: Neck supple.  Skin:    General: Skin is warm and dry.     Capillary Refill: Capillary refill takes less than 2 seconds.  Neurological:     Mental Status: He is alert and oriented to person, place, and time.  Psychiatric:        Behavior: Behavior normal.        Thought Content: Thought content normal.        Judgment: Judgment normal.     Ortho Exam patient gets from sitting to standing slight disc discomfort.  Has some tenderness of the sciatic notch negative logroll the hips knee and ankle jerk are intact anterior tib gastrocsoleus is strong.  Specialty Comments:  No specialty comments available.  Imaging: No results found.   PMFS History: Patient Active Problem List   Diagnosis Date Noted   Status post total replacement of right hip 07/19/2019   Unilateral primary osteoarthritis, right hip 06/17/2019   Lumbar radiculopathy 09/24/2018   Foraminal stenosis of lumbar region 09/24/2018   Spondylosis without myelopathy or radiculopathy, lumbar region 08/29/2016   Chronic left-sided low back pain without sciatica 08/29/2016   Past Medical History:  Diagnosis Date   Arthritis    hips and spine   Hypertension    Peripheral neuropathy    bilat    No family history on file.  Past Surgical History:  Procedure Laterality Date   FRACTURE SURGERY Right    radius and collar bone   TOTAL HIP ARTHROPLASTY Right 07/19/2019   Procedure: RIGHT TOTAL HIP ARTHROPLASTY ANTERIOR APPROACH;  Surgeon: Kathryne Hitch, MD;  Location: [facility];  Service: Orthopedics;   Laterality: Right;   Social History   Occupational History   Not on file  Tobacco Use   Smoking status: Never   Smokeless tobacco: Never  Vaping Use   Vaping Use: Never used  Substance and Sexual Activity   Alcohol use: Yes    Alcohol/week: 1.0 standard drink of alcohol    Types: 1 Standard drinks or equivalent per week   Drug use: Never   Sexual activity: Not on file

## 2022-08-11 ENCOUNTER — Encounter: Payer: Self-pay | Admitting: Physical Therapy

## 2022-08-11 ENCOUNTER — Ambulatory Visit: Payer: Medicare HMO | Admitting: Physical Therapy

## 2022-08-11 DIAGNOSIS — R293 Abnormal posture: Secondary | ICD-10-CM | POA: Diagnosis not present

## 2022-08-11 DIAGNOSIS — M5416 Radiculopathy, lumbar region: Secondary | ICD-10-CM | POA: Diagnosis not present

## 2022-08-11 DIAGNOSIS — M5459 Other low back pain: Secondary | ICD-10-CM | POA: Diagnosis not present

## 2022-08-11 DIAGNOSIS — M6281 Muscle weakness (generalized): Secondary | ICD-10-CM

## 2022-08-11 DIAGNOSIS — R600 Localized edema: Secondary | ICD-10-CM

## 2022-08-11 DIAGNOSIS — R2681 Unsteadiness on feet: Secondary | ICD-10-CM | POA: Diagnosis not present

## 2022-08-11 DIAGNOSIS — R2689 Other abnormalities of gait and mobility: Secondary | ICD-10-CM | POA: Diagnosis not present

## 2022-08-11 DIAGNOSIS — R262 Difficulty in walking, not elsewhere classified: Secondary | ICD-10-CM | POA: Diagnosis not present

## 2022-08-11 DIAGNOSIS — R278 Other lack of coordination: Secondary | ICD-10-CM | POA: Diagnosis not present

## 2022-08-11 NOTE — Therapy (Signed)
OUTPATIENT PHYSICAL THERAPY THORACOLUMBAR TREATMENT   Patient Name: Ricardo Stewart MRN: [id number] DOB:1946/03/28, 76 y.o., male Today's Date: 08/11/2022  Progress Note Reporting Period 06/28/22 to 08/02/22  See note below for Objective Data and Assessment of Progress/Goals.       PT End of Session - 08/11/22 1010     Visit Number 11    Date for PT Re-Evaluation 09/20/22    PT Start Time 1010    PT Stop Time 1055    PT Time Calculation (min) 45 min    Activity Tolerance Patient tolerated treatment well;Patient limited by pain    Behavior During Therapy WFL for tasks assessed/performed               Past Medical History:  Diagnosis Date   Arthritis    hips and spine   Hypertension    Peripheral neuropathy    bilat   Past Surgical History:  Procedure Laterality Date   FRACTURE SURGERY Right    radius and collar bone   TOTAL HIP ARTHROPLASTY Right 07/19/2019   Procedure: RIGHT TOTAL HIP ARTHROPLASTY ANTERIOR APPROACH;  Surgeon: Kathryne Hitch, MD;  Location: [facility];  Service: Orthopedics;  Laterality: Right;   Patient Active Problem List   Diagnosis Date Noted   Status post total replacement of right hip 07/19/2019   Unilateral primary osteoarthritis, right hip 06/17/2019   Lumbar radiculopathy 09/24/2018   Foraminal stenosis of lumbar region 09/24/2018   Spondylosis without myelopathy or radiculopathy, lumbar region 08/29/2016   Chronic left-sided low back pain without sciatica 08/29/2016    PCP: Sigmund Hazel  REFERRING PROVIDER: Eldred Manges, MD   REFERRING DIAG: M54.16 (ICD-10-CM) - Lumbar radiculopathy M54.50,G89.29 (ICD-10-CM) - Chronic left-sided low back pain without sciatica   Rationale for Evaluation and Treatment Rehabilitation  THERAPY DIAG:  Abnormal posture  Difficulty in walking, not elsewhere classified  Muscle weakness (generalized)  Localized edema  ONSET DATE: 06/10/2022   SUBJECTIVE:                                                                                                                                                                                            SUBJECTIVE STATEMENT: "I got a little hitch today" I can feel it in the L glut area. Surgeon left it up to him regarding therapy and surgery. Feel like therapy is helping  PERTINENT HISTORY:  HPI 76 year old male with back problems that radiates in his left leg for several years.  Patient had previous right total hip arthroplasty by Dr. Magnus Stewart and states about 9 months after his total hip he started having some gradual recurrence of  his left leg symptoms.  He has had worsening pain for the last few months radiating into his buttocks and his left thigh.  He denies numbness.  He uses an exercise bike and states walking is more difficult.  Some of his pain is located directly over the ischial tuberosity.   PAIN:  Are you having pain? Yes: NPRS scale: 0/10 Pain location: low back, buttock on L Pain description: discomfort Aggravating factors: walking/standing Relieving factors: lying down and pain meds   PRECAUTIONS: None  WEIGHT BEARING RESTRICTIONS: No  FALLS:  Has patient fallen in last 6 months? No  LIVING ENVIRONMENT: Lives with: lives with their spouse Lives in: House/apartment Stairs: Yes: Internal: 14 steps; on right going up Has following equipment at home: None  OCCUPATION: Retired, likes to walk daily, has started riding a stationary bike more now.  PLOF: Independent  PATIENT GOALS: Be able to walk daily without pain   OBJECTIVE:   DIAGNOSTIC FINDINGS:  Imaging: IMPRESSION: 1. At L4-5 there is a broad-based disc bulge with a right lateral disc osteophyte complex. Moderate bilateral facet arthropathy. Moderate spinal stenosis and bilateral lateral recess stenosis. No left foraminal stenosis. Moderate right foraminal stenosis. 2. At L5-S1 there is a broad-based disc bulge eccentric towards the left. Severe bilateral  facet arthropathy with ligamentum flavum infolding. Left lateral recess stenosis. Mild spinal stenosis. Severe left foraminal stenosis. Mild right foraminal stenosis. 3 mm left facet posterior extra-spinal synovial cyst. 3. Interval resolution of large intraspinal synovial cyst at L5-S1 and a smaller intraspinal synovial cyst at L4-5.Previous MRI scan lumbar 2019 showed disc degeneration with moderate stenosis. He had left lateral recess stenosis at L5-S1 with severe foraminal stenosis. Synovial cyst have been present on previous MRI that had resolved by 2019. We will set patient up for some physical therapy. If he does not respond to therapy then we will consider repeat lumbar MRI imaging. PT at [facility] which is convenient for him.   Previous MRI scan lumbar 2019 showed disc degeneration with moderate stenosis. He had left lateral recess stenosis at L5-S1 with severe foraminal stenosis. Synovial cyst have been present on previous MRI that had resolved by 2019. We will set patient up for some physical therapy. If he does not respond to therapy then we will consider repeat lumbar MRI imaging. PT at [facility] which is convenient for him.    SCREENING FOR RED FLAGS: Bowel or bladder incontinence: No Spinal tumors: No Cauda equina syndrome: No Compression fracture: No Abdominal aneurysm: No  COGNITION:  Overall cognitive status: Within functional limits for tasks assessed     SENSATION: Light touch: WFL  MUSCLE LENGTH: Hamstrings: Right 45 deg; Left 45 deg Thomas test:Both sides appear tight  POSTURE: decreased lumbar lordosis and decreased thoracic kyphosis  PALPATION: TTP along L piriformis  LUMBAR ROM:   AROM eval  Flexion To toes  Extension Mildly limited  Right lateral flexion To knee  Left lateral flexion 3" above knee  Right rotation WNL  Left rotation WNL   (Blank rows = not tested)  LOWER EXTREMITY ROM:   Generally stiff throughout, but WFL  LOWER EXTREMITY MMT:   All other strength WNL in BLE  MMT Right eval Left eval  Hip flexion    Hip extension  4-  Hip abduction  3+  Hip adduction    Hip internal rotation    Hip external rotation    Knee flexion    Knee extension    Ankle dorsiflexion  Ankle plantarflexion    Ankle inversion    Ankle eversion     (Blank rows = not tested)  LUMBAR SPECIAL TESTS:  Straight leg raise test: Negative and Slump test: Positive  FUNCTIONAL TESTS:  Berg Balance Scale: TBD  GAIT: Distance walked: 35' Assistive device utilized: None Level of assistance: Complete Independence Comments: Antalgic gait with favoring of LLE.    TODAY'S TREATMENT:  08/11/22 NuStep L5 x 6 min Leg Press 40lb 2x12 6in step up x 10 each Step ups 4in box on airex x10 each Seated roll out lumbar stretch forward and to the side LLE HS, Pifirormis, ITB, Glute, Single K2C, lower trunk rotation stretch stretch STM to L piriformis area with thea gun    08/02/22 Bike L3 x 6 minutes B HS PROM assessment- R 54, L 48 Supine over physioball, bridge, bridge with hip IR, x 10 each, bridge and roll ball side to side x 10, Dead bugs, 2 x 5 sets Heel raises on step, 2 x 10 Step ups, forward, side, crossover, 5 reps each   07/28/22 NuStep L5 x 6 min Hamstring curls 35lb 2x10 Leg Ext 10lb 2x10 Hip ext & abd 5lb x10 each  Leg press 30lb 2x15 6in step ups x 10 each  Supine on heat pad LE stretching, HS, Glut, Piriformis, ITB Bridges 2x10 STM to L glut and lumbar spine area use of theragun  07/26/22 NuStep L 5 x 6 min Standing rows 10lb 2x10 Shoulder Ext 10lb 2x10 Seated lat pulls 35lb 2x10 Supine on heat pad LE stretching, HS, Glut, Piriformis, ITB  STM to L glut and lumbar spine area use of theragun  07/21/22 Recumbent bike L4 x 6 minutes Deep STM to L piriformis F/B passive stretch along muscle belly. Supine isometric hip add and abd 10 x 5 sec each direction Dead bug ball squeeze for abdominal activation 10 x 5 sec  holds Dead bugs 10 reps each way, required rest breaks Lower trunk rotation, hold for deep breath at each end. SKTC 3 x 15 sec on each side. Seated forward lean over physioball, 4 x 15 straight, 4 x 15 sec to each side diagonally. Squats while holding 4# ball in BUE- 2 x 10 reps  07/19/22 NuStep L5 x 6 minutes Supine low back stretches including 3 way hip stretch with strap, ITB, glut, and piriformis stretches, HEP updated to include Self STM using tennis ball on L piriformis. Additional passive piriformis stretch Clamshells against G tband resistance, 2 x 10 reps Post pelvic tilts in supine- March while holding PPT   07/14/22 Recumbent bike L4 x 6 minutes Supine stretch with strap, HS, add, abd, 2 x 20 sec each leg Bridge with hip abd resistance from G tband, 10 reps Single leg bridge x 10 with each leg Dead bug, legs only, arms remain pointed to ceiling. Thomas stretch, 30 sec B Seated HS stretch, 30 sec each leg Standing hip flexor stretch against wall. Standing shoulder ext, rows, ER against G tband, 2 x 10 reps each Paloff press 20# 2 x 10 each side  07/12/22 NuStep L5  x 6 minutes Seated piriformis and ITB stretches, HS stretch Thomas stretch STM with tennis ball to piriformis  Supine- Bridge with Tband abd resistance, clamshells, 10 reps each Dynamic stretching, butt kicks, frogger, hip flexor wall stretch, HS stretch.   PATIENT EDUCATION:  Education details: POC, initial HEP Person educated: Patient Education method: Explanation, Demonstration, and Handouts Education comprehension: verbalized understanding and returned demonstration  HOME EXERCISE PROGRAM: Patient reports that he already performs DKTC, SKTC, B bridge, LTR,HS stretch against wall, standing calf stretch  [id number]  [id number]   ASSESSMENT:  CLINICAL IMPRESSION: Pt entered doing ok. Pt reports some improvement form therapy. He does report some issues with stairs due to balance and weakness. SBA  required to complete step ups. He has tightness with all passive stretching.    OBJECTIVE IMPAIRMENTS: Abnormal gait, decreased activity tolerance, decreased balance, decreased coordination, decreased mobility, difficulty walking, decreased ROM, decreased strength, impaired flexibility, improper body mechanics, and pain.   ACTIVITY LIMITATIONS: lifting, bending, standing, squatting, sleeping, stairs, and locomotion level  PARTICIPATION LIMITATIONS: meal prep, cleaning, driving, shopping, community activity, and yard work  PERSONAL FACTORS: Past/current experiences are also affecting patient's functional outcome.   REHAB POTENTIAL: Good  CLINICAL DECISION MAKING: Stable/uncomplicated  EVALUATION COMPLEXITY: Moderate   GOALS: Goals reviewed with patient? Yes  SHORT TERM GOALS: Target date: 07/26/2022  I with basic HEP Baseline: Goal status: ongoing  LONG TERM GOALS: Target date: 09/20/22  I with final HEP Baseline:  Goal status:ongoing  2.  Patient will score at least 52/56 on BERG Baseline: TBD Goal status: ongoing  3.  Patient will be able to ambulate at least 1 mile with back pain < 3/10 Baseline: 8/10, limits walking. 11/21- Patient has been riding his bike more than walking. Goal status: onoging  4.  Patient will score a max of 8 on Owestry Disability scale to demonstrate significant improvement in back pain Baseline: 15 Goal status: ongoing  5.  Increase L hip strength to at least 4/5 Baseline: 3+,4-, 11/21-4- Goal status: ongoing  PLAN: PT FREQUENCY: 2x/week  PT DURATION: 12 weeks  PLANNED INTERVENTIONS: Therapeutic exercises, Therapeutic activity, Neuromuscular re-education, Balance training, Gait training, Patient/Family education, Self Care, Joint mobilization, Stair training, Dry Needling, Electrical stimulation, Spinal mobilization, Cryotherapy, Moist heat, Traction, Ionotophoresis 4mg /ml Dexamethasone, and Manual therapy.  PLAN FOR NEXT SESSION: trunk  stabilization  Cheri Fowler PTA 08/11/2022, 10:11 AM

## 2022-08-16 ENCOUNTER — Encounter: Payer: Self-pay | Admitting: Physical Therapy

## 2022-08-16 ENCOUNTER — Ambulatory Visit: Payer: Medicare HMO | Attending: Orthopaedic Surgery | Admitting: Physical Therapy

## 2022-08-16 DIAGNOSIS — R293 Abnormal posture: Secondary | ICD-10-CM | POA: Insufficient documentation

## 2022-08-16 DIAGNOSIS — R2681 Unsteadiness on feet: Secondary | ICD-10-CM | POA: Insufficient documentation

## 2022-08-16 DIAGNOSIS — M5459 Other low back pain: Secondary | ICD-10-CM | POA: Diagnosis not present

## 2022-08-16 DIAGNOSIS — R2689 Other abnormalities of gait and mobility: Secondary | ICD-10-CM | POA: Insufficient documentation

## 2022-08-16 DIAGNOSIS — M6281 Muscle weakness (generalized): Secondary | ICD-10-CM | POA: Insufficient documentation

## 2022-08-16 DIAGNOSIS — M5416 Radiculopathy, lumbar region: Secondary | ICD-10-CM | POA: Insufficient documentation

## 2022-08-16 DIAGNOSIS — R278 Other lack of coordination: Secondary | ICD-10-CM | POA: Insufficient documentation

## 2022-08-16 DIAGNOSIS — R600 Localized edema: Secondary | ICD-10-CM | POA: Insufficient documentation

## 2022-08-16 DIAGNOSIS — R262 Difficulty in walking, not elsewhere classified: Secondary | ICD-10-CM | POA: Insufficient documentation

## 2022-08-16 NOTE — Therapy (Signed)
OUTPATIENT PHYSICAL THERAPY THORACOLUMBAR TREATMENT   Patient Name: Ricardo Stewart MRN: [id number] DOB:1945/11/13, 76 y.o., male Today's Date: 08/16/2022     PT End of Session - 08/16/22 1013     Visit Number 12    Date for PT Re-Evaluation 09/20/22    PT Start Time 1015    PT Stop Time 1100    PT Time Calculation (min) 45 min    Activity Tolerance Patient tolerated treatment well;Patient limited by pain    Behavior During Therapy [facility] for tasks assessed/performed               Past Medical History:  Diagnosis Date   Arthritis    hips and spine   Hypertension    Peripheral neuropathy    bilat   Past Surgical History:  Procedure Laterality Date   FRACTURE SURGERY Right    radius and collar bone   TOTAL HIP ARTHROPLASTY Right 07/19/2019   Procedure: RIGHT TOTAL HIP ARTHROPLASTY ANTERIOR APPROACH;  Surgeon: Mcarthur Rossetti, MD;  Location: [facility];  Service: Orthopedics;  Laterality: Right;   Patient Active Problem List   Diagnosis Date Noted   Status post total replacement of right hip 07/19/2019   Unilateral primary osteoarthritis, right hip 06/17/2019   Lumbar radiculopathy 09/24/2018   Foraminal stenosis of lumbar region 09/24/2018   Spondylosis without myelopathy or radiculopathy, lumbar region 08/29/2016   Chronic left-sided low back pain without sciatica 08/29/2016    PCP: Kathyrn Lass  REFERRING PROVIDER: Marybelle Killings, MD   REFERRING DIAG: M54.16 (ICD-10-CM) - Lumbar radiculopathy M54.50,G89.29 (ICD-10-CM) - Chronic left-sided low back pain without sciatica   Rationale for Evaluation and Treatment Rehabilitation  THERAPY DIAG:  Abnormal posture  Muscle weakness (generalized)  Difficulty in walking, not elsewhere classified  Localized edema  ONSET DATE: 06/10/2022   SUBJECTIVE:                                                                                                                                                                                            SUBJECTIVE STATEMENT: "We are doing ok today"  PERTINENT HISTORY:  HPI 76 year old male with back problems that radiates in his left leg for several years.  Patient had previous right total hip arthroplasty by Dr. [name] and states about 9 months after his total hip he started having some gradual recurrence of his left leg symptoms.  He has had worsening pain for the last few months radiating into his buttocks and his left thigh.  He denies numbness.  He uses an exercise bike and states walking is more difficult.  Some of his pain is located  directly over the ischial tuberosity.   PAIN:  Are you having pain? Yes: NPRS scale: 0/10 Pain location: low back, buttock on L Pain description: discomfort Aggravating factors: walking/standing Relieving factors: lying down and pain meds   PRECAUTIONS: None  WEIGHT BEARING RESTRICTIONS: No  FALLS:  Has patient fallen in last 6 months? No  LIVING ENVIRONMENT: Lives with: lives with their spouse Lives in: House/apartment Stairs: Yes: Internal: 14 steps; on right going up Has following equipment at home: None  OCCUPATION: Retired, likes to walk daily, has started riding a stationary bike more now.  PLOF: Independent  PATIENT GOALS: Be able to walk daily without pain   OBJECTIVE:   DIAGNOSTIC FINDINGS:  Imaging: IMPRESSION: 1. At L4-5 there is a broad-based disc bulge with a right lateral disc osteophyte complex. Moderate bilateral facet arthropathy. Moderate spinal stenosis and bilateral lateral recess stenosis. No left foraminal stenosis. Moderate right foraminal stenosis. 2. At L5-S1 there is a broad-based disc bulge eccentric towards the left. Severe bilateral facet arthropathy with ligamentum flavum infolding. Left lateral recess stenosis. Mild spinal stenosis. Severe left foraminal stenosis. Mild right foraminal stenosis. 3 mm left facet posterior extra-spinal synovial cyst. 3. Interval resolution of  large intraspinal synovial cyst at L5-S1 and a smaller intraspinal synovial cyst at L4-5.Previous MRI scan lumbar 2019 showed disc degeneration with moderate stenosis. He had left lateral recess stenosis at L5-S1 with severe foraminal stenosis. Synovial cyst have been present on previous MRI that had resolved by 2019. We will set patient up for some physical therapy. If he does not respond to therapy then we will consider repeat lumbar MRI imaging. PT at [facility] which is convenient for him.   Previous MRI scan lumbar 2019 showed disc degeneration with moderate stenosis. He had left lateral recess stenosis at L5-S1 with severe foraminal stenosis. Synovial cyst have been present on previous MRI that had resolved by 2019. We will set patient up for some physical therapy. If he does not respond to therapy then we will consider repeat lumbar MRI imaging. PT at [facility] which is convenient for him.    SCREENING FOR RED FLAGS: Bowel or bladder incontinence: No Spinal tumors: No Cauda equina syndrome: No Compression fracture: No Abdominal aneurysm: No  COGNITION:  Overall cognitive status: Within functional limits for tasks assessed     SENSATION: Light touch: WFL  MUSCLE LENGTH: Hamstrings: Right 45 deg; Left 45 deg Thomas test:Both sides appear tight  POSTURE: decreased lumbar lordosis and decreased thoracic kyphosis  PALPATION: TTP along L piriformis  LUMBAR ROM:   AROM eval  Flexion To toes  Extension Mildly limited  Right lateral flexion To knee  Left lateral flexion 3" above knee  Right rotation WNL  Left rotation WNL   (Blank rows = not tested)  LOWER EXTREMITY ROM:   Generally stiff throughout, but WFL  LOWER EXTREMITY MMT:  All other strength WNL in BLE  MMT Right eval Left eval  Hip flexion    Hip extension  4-  Hip abduction  3+  Hip adduction    Hip internal rotation    Hip external rotation    Knee flexion    Knee extension    Ankle dorsiflexion     Ankle plantarflexion    Ankle inversion    Ankle eversion     (Blank rows = not tested)  LUMBAR SPECIAL TESTS:  Straight leg raise test: Negative and Slump test: Positive  FUNCTIONAL TESTS:  Berg Balance Scale: TBD  GAIT:  Distance walked: 53' Assistive device utilized: None Level of assistance: Complete Independence Comments: Antalgic gait with favoring of LLE.    TODAY'S TREATMENT:  08/16/22 Bike L3 x 6 min Step ups 4 in box on airex x10 each 10lb shoulder Ext standing on airex 2x12 20lb resisted side step over WaTE bar x5 Heel raises vlack bar 2x10 Rows & Lats 35lb 2x10 Leg press 50lb 2x10 Seated roll out lumbar stretch forward and to the side LLE HS, Pifirormis, ITB, Glute, Single K2C, lower trunk rotation stretch stretch, MHP to lumbar spine STM to L piriformis area with thea gun  08/11/22 NuStep L5 x 6 min Leg Press 40lb 2x12 6in step up x 10 each Step ups 4in box on airex x10 each Seated roll out lumbar stretch forward and to the side LLE HS, Pifirormis, ITB, Glute, Single K2C, lower trunk rotation stretch stretch STM to L piriformis area with thea gun    08/02/22 Bike L3 x 6 minutes B HS PROM assessment- R 54, L 48 Supine over physioball, bridge, bridge with hip IR, x 10 each, bridge and roll ball side to side x 10, Dead bugs, 2 x 5 sets Heel raises on step, 2 x 10 Step ups, forward, side, crossover, 5 reps each   07/28/22 NuStep L5 x 6 min Hamstring curls 35lb 2x10 Leg Ext 10lb 2x10 Hip ext & abd 5lb x10 each  Leg press 30lb 2x15 6in step ups x 10 each  Supine on heat pad LE stretching, HS, Glut, Piriformis, ITB Bridges 2x10 STM to L glut and lumbar spine area use of theragun  07/26/22 NuStep L 5 x 6 min Standing rows 10lb 2x10 Shoulder Ext 10lb 2x10 Seated lat pulls 35lb 2x10 Supine on heat pad LE stretching, HS, Glut, Piriformis, ITB  STM to L glut and lumbar spine area use of theragun  07/21/22 Recumbent bike L4 x 6 minutes Deep STM  to L piriformis F/B passive stretch along muscle belly. Supine isometric hip add and abd 10 x 5 sec each direction Dead bug ball squeeze for abdominal activation 10 x 5 sec holds Dead bugs 10 reps each way, required rest breaks Lower trunk rotation, hold for deep breath at each end. SKTC 3 x 15 sec on each side. Seated forward lean over physioball, 4 x 15 straight, 4 x 15 sec to each side diagonally. Squats while holding 4# ball in BUE- 2 x 10 reps  07/19/22 NuStep L5 x 6 minutes Supine low back stretches including 3 way hip stretch with strap, ITB, glut, and piriformis stretches, HEP updated to include Self STM using tennis ball on L piriformis. Additional passive piriformis stretch Clamshells against G tband resistance, 2 x 10 reps Post pelvic tilts in supine- March while holding PPT   07/14/22 Recumbent bike L4 x 6 minutes Supine stretch with strap, HS, add, abd, 2 x 20 sec each leg Bridge with hip abd resistance from G tband, 10 reps Single leg bridge x 10 with each leg Dead bug, legs only, arms remain pointed to ceiling. Thomas stretch, 30 sec B Seated HS stretch, 30 sec each leg Standing hip flexor stretch against wall. Standing shoulder ext, rows, ER against G tband, 2 x 10 reps each Paloff press 20# 2 x 10 each side  07/12/22 NuStep L5  x 6 minutes Seated piriformis and ITB stretches, HS stretch Thomas stretch STM with tennis ball to piriformis  Supine- Bridge with Tband abd resistance, clamshells, 10 reps each Dynamic stretching, butt kicks, frogger,  hip flexor wall stretch, HS stretch.   PATIENT EDUCATION:  Education details: POC, initial HEP Person educated: Patient Education method: Consulting civil engineer, Demonstration, and Handouts Education comprehension: verbalized understanding and returned demonstration   HOME EXERCISE PROGRAM: Patient reports that he already performs DKTC, SKTC, B bridge, LTR,HS stretch against wall, standing calf stretch  [id number]   [id number]   ASSESSMENT:  CLINICAL IMPRESSION: Pt entered doing ok. Continues to report some improvement form therapy. Some initial instability with resisted side step over WaTE bar.  SBA required to complete step ups. He has tightness with all passive stretching. Positive response to STM   OBJECTIVE IMPAIRMENTS: Abnormal gait, decreased activity tolerance, decreased balance, decreased coordination, decreased mobility, difficulty walking, decreased ROM, decreased strength, impaired flexibility, improper body mechanics, and pain.   ACTIVITY LIMITATIONS: lifting, bending, standing, squatting, sleeping, stairs, and locomotion level  PARTICIPATION LIMITATIONS: meal prep, cleaning, driving, shopping, community activity, and yard work  PERSONAL FACTORS: Past/current experiences are also affecting patient's functional outcome.   REHAB POTENTIAL: Good  CLINICAL DECISION MAKING: Stable/uncomplicated  EVALUATION COMPLEXITY: Moderate   GOALS: Goals reviewed with patient? Yes  SHORT TERM GOALS: Target date: 07/26/2022  I with basic HEP Baseline: Goal status: ongoing  LONG TERM GOALS: Target date: 09/20/22  I with final HEP Baseline:  Goal status:ongoing  2.  Patient will score at least 52/56 on BERG Baseline: TBD Goal status: ongoing  3.  Patient will be able to ambulate at least 1 mile with back pain < 3/10 Baseline: 8/10, limits walking. 11/21- Patient has been riding his bike more than walking. Goal status: onoging  4.  Patient will score a max of 8 on Owestry Disability scale to demonstrate significant improvement in back pain Baseline: 15 Goal status: ongoing  5.  Increase L hip strength to at least 4/5 Baseline: 3+,4-, 11/21-4- Goal status: ongoing  PLAN: PT FREQUENCY: 2x/week  PT DURATION: 12 weeks  PLANNED INTERVENTIONS: Therapeutic exercises, Therapeutic activity, Neuromuscular re-education, Balance training, Gait training, Patient/Family education, Self Care,  Joint mobilization, Stair training, Dry Needling, Electrical stimulation, Spinal mobilization, Cryotherapy, Moist heat, Traction, Ionotophoresis 4mg /ml Dexamethasone, and Manual therapy.  PLAN FOR NEXT SESSION: trunk stabilization  Cheri Fowler PTA 08/16/2022, 10:14 AM

## 2022-08-18 ENCOUNTER — Encounter: Payer: Self-pay | Admitting: Physical Therapy

## 2022-08-18 ENCOUNTER — Ambulatory Visit: Payer: Medicare HMO | Admitting: Physical Therapy

## 2022-08-18 DIAGNOSIS — R278 Other lack of coordination: Secondary | ICD-10-CM | POA: Diagnosis not present

## 2022-08-18 DIAGNOSIS — R2689 Other abnormalities of gait and mobility: Secondary | ICD-10-CM | POA: Diagnosis not present

## 2022-08-18 DIAGNOSIS — M5416 Radiculopathy, lumbar region: Secondary | ICD-10-CM

## 2022-08-18 DIAGNOSIS — M5459 Other low back pain: Secondary | ICD-10-CM

## 2022-08-18 DIAGNOSIS — R600 Localized edema: Secondary | ICD-10-CM

## 2022-08-18 DIAGNOSIS — M6281 Muscle weakness (generalized): Secondary | ICD-10-CM | POA: Diagnosis not present

## 2022-08-18 DIAGNOSIS — R2681 Unsteadiness on feet: Secondary | ICD-10-CM

## 2022-08-18 DIAGNOSIS — R262 Difficulty in walking, not elsewhere classified: Secondary | ICD-10-CM | POA: Diagnosis not present

## 2022-08-18 DIAGNOSIS — R293 Abnormal posture: Secondary | ICD-10-CM | POA: Diagnosis not present

## 2022-08-18 NOTE — Therapy (Signed)
OUTPATIENT PHYSICAL THERAPY THORACOLUMBAR TREATMENT   Patient Name: Ricardo Stewart MRN: [id number] DOB:05/22/1946, 76 y.o., male Today's Date: 08/18/2022     PT End of Session - 08/18/22 1011     Visit Number 13    Date for PT Re-Evaluation 09/20/22    PT Start Time 1009    PT Stop Time 1048    PT Time Calculation (min) 39 min    Activity Tolerance Patient tolerated treatment well;Patient limited by pain    Behavior During Therapy [facility] for tasks assessed/performed               Past Medical History:  Diagnosis Date   Arthritis    hips and spine   Hypertension    Peripheral neuropathy    bilat   Past Surgical History:  Procedure Laterality Date   FRACTURE SURGERY Right    radius and collar bone   TOTAL HIP ARTHROPLASTY Right 07/19/2019   Procedure: RIGHT TOTAL HIP ARTHROPLASTY ANTERIOR APPROACH;  Surgeon: Mcarthur Rossetti, MD;  Location: [facility];  Service: Orthopedics;  Laterality: Right;   Patient Active Problem List   Diagnosis Date Noted   Status post total replacement of right hip 07/19/2019   Unilateral primary osteoarthritis, right hip 06/17/2019   Lumbar radiculopathy 09/24/2018   Foraminal stenosis of lumbar region 09/24/2018   Spondylosis without myelopathy or radiculopathy, lumbar region 08/29/2016   Chronic left-sided low back pain without sciatica 08/29/2016    PCP: Kathyrn Lass  REFERRING PROVIDER: Marybelle Killings, MD   REFERRING DIAG: M54.16 (ICD-10-CM) - Lumbar radiculopathy M54.50,G89.29 (ICD-10-CM) - Chronic left-sided low back pain without sciatica   Rationale for Evaluation and Treatment Rehabilitation  THERAPY DIAG:  Abnormal posture  Muscle weakness (generalized)  Difficulty in walking, not elsewhere classified  Localized edema  Other abnormalities of gait and mobility  Other lack of coordination  Lumbar radiculopathy  Other low back pain  Unsteadiness on feet  ONSET DATE: 06/10/2022   SUBJECTIVE:                                                                                                                                                                                            SUBJECTIVE STATEMENT: Patient reports a bad nose bleed last night as well as increased BP. He wants to try therapy, but keep it easy today. He saw the Orthopedic surgeon last week and is scheduled for an MRI.  PERTINENT HISTORY:  HPI 76 year old male with back problems that radiates in his left leg for several years.  Patient had previous right total hip arthroplasty by Dr. Ninfa Stewart and states about 9 months after his  total hip he started having some gradual recurrence of his left leg symptoms.  He has had worsening pain for the last few months radiating into his buttocks and his left thigh.  He denies numbness.  He uses an exercise bike and states walking is more difficult.  Some of his pain is located directly over the ischial tuberosity.   PAIN:  Are you having pain? Yes: NPRS scale: 0/10 Pain location: low back, buttock on L Pain description: discomfort Aggravating factors: walking/standing Relieving factors: lying down and pain meds   PRECAUTIONS: None  WEIGHT BEARING RESTRICTIONS: No  FALLS:  Has patient fallen in last 6 months? No  LIVING ENVIRONMENT: Lives with: lives with their spouse Lives in: House/apartment Stairs: Yes: Internal: 14 steps; on right going up Has following equipment at home: None  OCCUPATION: Retired, likes to walk daily, has started riding a stationary bike more now.  PLOF: Independent  PATIENT GOALS: Be able to walk daily without pain   OBJECTIVE:   DIAGNOSTIC FINDINGS:  Imaging: IMPRESSION: 1. At L4-5 there is a broad-based disc bulge with a right lateral disc osteophyte complex. Moderate bilateral facet arthropathy. Moderate spinal stenosis and bilateral lateral recess stenosis. No left foraminal stenosis. Moderate right foraminal stenosis. 2. At L5-S1 there is a broad-based  disc bulge eccentric towards the left. Severe bilateral facet arthropathy with ligamentum flavum infolding. Left lateral recess stenosis. Mild spinal stenosis. Severe left foraminal stenosis. Mild right foraminal stenosis. 3 mm left facet posterior extra-spinal synovial cyst. 3. Interval resolution of large intraspinal synovial cyst at L5-S1 and a smaller intraspinal synovial cyst at L4-5.Previous MRI scan lumbar 2019 showed disc degeneration with moderate stenosis. He had left lateral recess stenosis at L5-S1 with severe foraminal stenosis. Synovial cyst have been present on previous MRI that had resolved by 2019. We will set patient up for some physical therapy. If he does not respond to therapy then we will consider repeat lumbar MRI imaging. PT at [facility] which is convenient for him.   Previous MRI scan lumbar 2019 showed disc degeneration with moderate stenosis. He had left lateral recess stenosis at L5-S1 with severe foraminal stenosis. Synovial cyst have been present on previous MRI that had resolved by 2019. We will set patient up for some physical therapy. If he does not respond to therapy then we will consider repeat lumbar MRI imaging. PT at [place] which is convenient for him.    SCREENING FOR RED FLAGS: Bowel or bladder incontinence: No Spinal tumors: No Cauda equina syndrome: No Compression fracture: No Abdominal aneurysm: No  COGNITION:  Overall cognitive status: Within functional limits for tasks assessed     SENSATION: Light touch: WFL  MUSCLE LENGTH: Hamstrings: Right 45 deg; Left 45 deg Thomas test:Both sides appear tight  POSTURE: decreased lumbar lordosis and decreased thoracic kyphosis  PALPATION: TTP along L piriformis  LUMBAR ROM:   AROM eval  Flexion To toes  Extension Mildly limited  Right lateral flexion To knee  Left lateral flexion 3" above knee  Right rotation WNL  Left rotation WNL   (Blank rows = not tested)  LOWER EXTREMITY ROM:    Generally stiff throughout, but WFL  LOWER EXTREMITY MMT:  All other strength WNL in BLE  MMT Right eval Left eval  Hip flexion    Hip extension  4-  Hip abduction  3+  Hip adduction    Hip internal rotation    Hip external rotation    Knee flexion  Knee extension    Ankle dorsiflexion    Ankle plantarflexion    Ankle inversion    Ankle eversion     (Blank rows = not tested)  LUMBAR SPECIAL TESTS:  Straight leg raise test: Negative and Slump test: Positive  FUNCTIONAL TESTS:  Berg Balance Scale: TBD  GAIT: Distance walked: 8' Assistive device utilized: None Level of assistance: Complete Independence Comments: Antalgic gait with favoring of LLE.    TODAY'S TREATMENT:  08/18/22 NuStep L5 x 6 minutes STM to L piriformis with tennis ball, with stretches Isometric hip abd/add x 5 sec each way, 2 x 10 reps 3 way stretch with strap, HS, abd, add, 30 sec each. Seated piriformis and ITB stretch on mat, 2 x 30 sec each side. Heel raises with calf stretch on step, SLS, 2 x 10 reps each leg. Standing on upside down BOSU in parallel bars, lateral weight shifts, ant/post, mini squats x 10 each, no UE support, CGA.  08/16/22 Bike L3 x 6 min Step ups 4 in box on airex x10 each 10lb shoulder Ext standing on airex 2x12 20lb resisted side step over WaTE bar x5 Heel raises vlack bar 2x10 Rows & Lats 35lb 2x10 Leg press 50lb 2x10 Seated roll out lumbar stretch forward and to the side LLE HS, Pifirormis, ITB, Glute, Single K2C, lower trunk rotation stretch stretch, MHP to lumbar spine STM to L piriformis area with thea gun  08/11/22 NuStep L5 x 6 min Leg Press 40lb 2x12 6in step up x 10 each Step ups 4in box on airex x10 each Seated roll out lumbar stretch forward and to the side LLE HS, Pifirormis, ITB, Glute, Single K2C, lower trunk rotation stretch stretch STM to L piriformis area with thea gun    08/02/22 Bike L3 x 6 minutes B HS PROM assessment- R 54, L  48 Supine over physioball, bridge, bridge with hip IR, x 10 each, bridge and roll ball side to side x 10, Dead bugs, 2 x 5 sets Heel raises on step, 2 x 10 Step ups, forward, side, crossover, 5 reps each   07/28/22 NuStep L5 x 6 min Hamstring curls 35lb 2x10 Leg Ext 10lb 2x10 Hip ext & abd 5lb x10 each  Leg press 30lb 2x15 6in step ups x 10 each  Supine on heat pad LE stretching, HS, Glut, Piriformis, ITB Bridges 2x10 STM to L glut and lumbar spine area use of theragun  07/26/22 NuStep L 5 x 6 min Standing rows 10lb 2x10 Shoulder Ext 10lb 2x10 Seated lat pulls 35lb 2x10 Supine on heat pad LE stretching, HS, Glut, Piriformis, ITB  STM to L glut and lumbar spine area use of theragun  07/21/22 Recumbent bike L4 x 6 minutes Deep STM to L piriformis F/B passive stretch along muscle belly. Supine isometric hip add and abd 10 x 5 sec each direction Dead bug ball squeeze for abdominal activation 10 x 5 sec holds Dead bugs 10 reps each way, required rest breaks Lower trunk rotation, hold for deep breath at each end. SKTC 3 x 15 sec on each side. Seated forward lean over physioball, 4 x 15 straight, 4 x 15 sec to each side diagonally. Squats while holding 4# ball in BUE- 2 x 10 reps  07/19/22 NuStep L5 x 6 minutes Supine low back stretches including 3 way hip stretch with strap, ITB, glut, and piriformis stretches, HEP updated to include Self STM using tennis ball on L piriformis. Additional passive piriformis stretch Clamshells against G  tband resistance, 2 x 10 reps Post pelvic tilts in supine- March while holding PPT   07/14/22 Recumbent bike L4 x 6 minutes Supine stretch with strap, HS, add, abd, 2 x 20 sec each leg Bridge with hip abd resistance from G tband, 10 reps Single leg bridge x 10 with each leg Dead bug, legs only, arms remain pointed to ceiling. Thomas stretch, 30 sec B Seated HS stretch, 30 sec each leg Standing hip flexor stretch against wall. Standing  shoulder ext, rows, ER against G tband, 2 x 10 reps each Paloff press 20# 2 x 10 each side  07/12/22 NuStep L5  x 6 minutes Seated piriformis and ITB stretches, HS stretch Thomas stretch STM with tennis ball to piriformis  Supine- Bridge with Tband abd resistance, clamshells, 10 reps each Dynamic stretching, butt kicks, frogger, hip flexor wall stretch, HS stretch.   PATIENT EDUCATION:  Education details: POC, initial HEP Person educated: Patient Education method: Consulting civil engineer, Demonstration, and Handouts Education comprehension: verbalized understanding and returned demonstration   HOME EXERCISE PROGRAM: Patient reports that he already performs DKTC, SKTC, B bridge, LTR, HS stretch against wall, standing calf stretch  [id number]  [id number]   ASSESSMENT:  CLINICAL IMPRESSION: Pt reports a severe nose bleed last night and also elevated BP. He still wanted to continue with treatment. Focused on stretch and STM today, avoiding any activities involving breath holding or strain. He is getting close to wrapping up therapy. Plan for 2 more visits to update and finalize HEP.   OBJECTIVE IMPAIRMENTS: Abnormal gait, decreased activity tolerance, decreased balance, decreased coordination, decreased mobility, difficulty walking, decreased ROM, decreased strength, impaired flexibility, improper body mechanics, and pain.   ACTIVITY LIMITATIONS: lifting, bending, standing, squatting, sleeping, stairs, and locomotion level  PARTICIPATION LIMITATIONS: meal prep, cleaning, driving, shopping, community activity, and yard work  PERSONAL FACTORS: Past/current experiences are also affecting patient's functional outcome.   REHAB POTENTIAL: Good  CLINICAL DECISION MAKING: Stable/uncomplicated  EVALUATION COMPLEXITY: Moderate   GOALS: Goals reviewed with patient? Yes  SHORT TERM GOALS: Target date: 07/26/2022  I with basic HEP Baseline: Goal status: ongoing  LONG TERM GOALS: Target date:  09/20/22  I with final HEP Baseline:  Goal status:ongoing  2.  Patient will score at least 52/56 on BERG Baseline: TBD Goal status: ongoing  3.  Patient will be able to ambulate at least 1 mile with back pain < 3/10 Baseline: 8/10, limits walking. 11/21- Patient has been riding his bike more than walking. Goal status: onoging  4.  Patient will score a max of 8 on [employer] Disability scale to demonstrate significant improvement in back pain Baseline: 15 Goal status: ongoing  5.  Increase L hip strength to at least 4/5 Baseline: 3+,4-, 11/21-4- Goal status: ongoing  PLAN: PT FREQUENCY: 2x/week  PT DURATION: 12 weeks  PLANNED INTERVENTIONS: Therapeutic exercises, Therapeutic activity, Neuromuscular re-education, Balance training, Gait training, Patient/Family education, Self Care, Joint mobilization, Stair training, Dry Needling, Electrical stimulation, Spinal mobilization, Cryotherapy, Moist heat, Traction, Ionotophoresis 4mg /ml Dexamethasone, and Manual therapy.  PLAN FOR NEXT SESSION: trunk stabilization  Ethel Rana DPT 08/18/22 10:54 AM  08/18/2022, 10:54 AM

## 2022-08-22 ENCOUNTER — Telehealth: Payer: Self-pay | Admitting: Orthopaedic Surgery

## 2022-08-23 ENCOUNTER — Ambulatory Visit: Payer: Medicare HMO | Admitting: Physical Therapy

## 2022-08-23 ENCOUNTER — Encounter: Payer: Self-pay | Admitting: Physical Therapy

## 2022-08-23 DIAGNOSIS — R2689 Other abnormalities of gait and mobility: Secondary | ICD-10-CM | POA: Diagnosis not present

## 2022-08-23 DIAGNOSIS — R2681 Unsteadiness on feet: Secondary | ICD-10-CM

## 2022-08-23 DIAGNOSIS — M6281 Muscle weakness (generalized): Secondary | ICD-10-CM

## 2022-08-23 DIAGNOSIS — R293 Abnormal posture: Secondary | ICD-10-CM | POA: Diagnosis not present

## 2022-08-23 DIAGNOSIS — R262 Difficulty in walking, not elsewhere classified: Secondary | ICD-10-CM | POA: Diagnosis not present

## 2022-08-23 DIAGNOSIS — M5416 Radiculopathy, lumbar region: Secondary | ICD-10-CM | POA: Diagnosis not present

## 2022-08-23 DIAGNOSIS — R600 Localized edema: Secondary | ICD-10-CM | POA: Diagnosis not present

## 2022-08-23 DIAGNOSIS — M5459 Other low back pain: Secondary | ICD-10-CM

## 2022-08-23 DIAGNOSIS — R278 Other lack of coordination: Secondary | ICD-10-CM | POA: Diagnosis not present

## 2022-08-23 NOTE — Therapy (Signed)
OUTPATIENT PHYSICAL THERAPY THORACOLUMBAR TREATMENT   Patient Name: Ricardo Stewart MRN: [id number] DOB:October 14, 1945, 76 y.o., male Today's Date: 08/23/2022     PT End of Session - 08/23/22 1018     Visit Number 14    Date for PT Re-Evaluation 09/20/22    PT Start Time 1014    PT Stop Time 1055    PT Time Calculation (min) 41 min    Activity Tolerance Patient tolerated treatment well;Patient limited by pain    [facility] for tasks assessed/performed                Past Medical History:  Diagnosis Date   Arthritis    hips and spine   Hypertension    Peripheral neuropathy    bilat   Past Surgical History:  Procedure Laterality Date   FRACTURE SURGERY Right    radius and collar bone   TOTAL HIP ARTHROPLASTY Right 07/19/2019   Procedure: RIGHT TOTAL HIP ARTHROPLASTY ANTERIOR APPROACH;  Surgeon: Kathryne HitchBlackman, Christopher Y, MD;  Location: [facility];  Service: Orthopedics;  Laterality: Right;   Patient Active Problem List   Diagnosis Date Noted   Status post total replacement of right hip 07/19/2019   Unilateral primary osteoarthritis, right hip 06/17/2019   Lumbar radiculopathy 09/24/2018   Foraminal stenosis of lumbar region 09/24/2018   Spondylosis without myelopathy or radiculopathy, lumbar region 08/29/2016   Chronic left-sided low back pain without sciatica 08/29/2016    PCP: Sigmund HazelMiller, Lisa  REFERRING PROVIDER: Eldred MangesYates, Mark C, MD   REFERRING DIAG: M54.16 (ICD-10-CM) - Lumbar radiculopathy M54.50,G89.29 (ICD-10-CM) - Chronic left-sided low back pain without sciatica   Rationale for Evaluation and Treatment Rehabilitation  THERAPY DIAG:  Abnormal posture  Muscle weakness (generalized)  Difficulty in walking, not elsewhere classified  Other lack of coordination  Other low back pain  Unsteadiness on feet  ONSET DATE: 06/10/2022   SUBJECTIVE:                                                                                                                                                                                            SUBJECTIVE STATEMENT: Patient reports that he has an MRI scheduled for 12/28.   PERTINENT HISTORY:  HPI 76 year old male with back problems that radiates in his left leg for several years.  Patient had previous right total hip arthroplasty by Dr. Magnus IvanBlackman and states about 9 months after his total hip he started having some gradual recurrence of his left leg symptoms.  He has had worsening pain for the last few months radiating into his buttocks and his left thigh.  He denies numbness.  He uses an exercise bike and states walking is more difficult.  Some of his pain is located directly over the ischial tuberosity.   PAIN:  Are you having pain? Yes: NPRS scale: 0/10 Pain location: low back, buttock on L Pain description: discomfort Aggravating factors: walking/standing Relieving factors: lying down and pain meds   PRECAUTIONS: None  WEIGHT BEARING RESTRICTIONS: No  FALLS:  Has patient fallen in last 6 months? No  LIVING ENVIRONMENT: Lives with: lives with their spouse Lives in: House/apartment Stairs: Yes: Internal: 14 steps; on right going up Has following equipment at home: None  OCCUPATION: Retired, likes to walk daily, has started riding a stationary bike more now.  PLOF: Independent  PATIENT GOALS: Be able to walk daily without pain   OBJECTIVE:   DIAGNOSTIC FINDINGS:  Imaging: IMPRESSION: 1. At L4-5 there is a broad-based disc bulge with a right lateral disc osteophyte complex. Moderate bilateral facet arthropathy. Moderate spinal stenosis and bilateral lateral recess stenosis. No left foraminal stenosis. Moderate right foraminal stenosis. 2. At L5-S1 there is a broad-based disc bulge eccentric towards the left. Severe bilateral facet arthropathy with ligamentum flavum infolding. Left lateral recess stenosis. Mild spinal stenosis. Severe left foraminal stenosis. Mild right foraminal  stenosis. 3 mm left facet posterior extra-spinal synovial cyst. 3. Interval resolution of large intraspinal synovial cyst at L5-S1 and a smaller intraspinal synovial cyst at L4-5.Previous MRI scan lumbar 2019 showed disc degeneration with moderate stenosis. He had left lateral recess stenosis at L5-S1 with severe foraminal stenosis. Synovial cyst have been present on previous MRI that had resolved by 2019. We will set patient up for some physical therapy. If he does not respond to therapy then we will consider repeat lumbar MRI imaging. PT at [facility] which is convenient for him.   Previous MRI scan lumbar 2019 showed disc degeneration with moderate stenosis. He had left lateral recess stenosis at L5-S1 with severe foraminal stenosis. Synovial cyst have been present on previous MRI that had resolved by 2019. We will set patient up for some physical therapy. If he does not respond to therapy then we will consider repeat lumbar MRI imaging. PT at [facility] which is convenient for him.    SCREENING FOR RED FLAGS: Bowel or bladder incontinence: No Spinal tumors: No Cauda equina syndrome: No Compression fracture: No Abdominal aneurysm: No  COGNITION:  Overall cognitive status: Within functional limits for tasks assessed     SENSATION: Light touch: WFL  MUSCLE LENGTH: Hamstrings: Right 45 deg; Left 45 deg Thomas test:Both sides appear tight  POSTURE: decreased lumbar lordosis and decreased thoracic kyphosis  PALPATION: TTP along L piriformis  LUMBAR ROM:   AROM eval  Flexion To toes  Extension Mildly limited  Right lateral flexion To knee  Left lateral flexion 3" above knee  Right rotation WNL  Left rotation WNL   (Blank rows = not tested)  LOWER EXTREMITY ROM:   Generally stiff throughout, but WFL  LOWER EXTREMITY MMT:  All other strength WNL in BLE  MMT Right eval Left eval  Hip flexion    Hip extension  4-  Hip abduction  3+  Hip adduction    Hip internal rotation     Hip external rotation    Knee flexion    Knee extension    Ankle dorsiflexion    Ankle plantarflexion    Ankle inversion    Ankle eversion     (Blank rows = not tested)  LUMBAR SPECIAL TESTS:  Straight  leg raise test: Negative and Slump test: Positive  FUNCTIONAL TESTS:  Berg Balance Scale: TBD  GAIT: Distance walked: 52' Assistive device utilized: None Level of assistance: Complete Independence Comments: Antalgic gait with favoring of LLE.    TODAY'S TREATMENT:  08/23/22 NuStep L5 x 6 minutes Standing side steps against G tband resistance, 3 x 5 steps to each side Standing hip abd against G tband resistance Standing shoulder rows, ext, ER against G tband resistance 2 x 10 reps. Step ups onto 6" step, 10 reps each leg. Seated deep tissue massage using tennis ball.  Continue  with ball under L hip and stretch gluts and piriformis in sitting Side step ups and crossover step ups x 5 each   08/18/22 NuStep L5 x 6 minutes STM to L piriformis with tennis ball, with stretches Isometric hip abd/add x 5 sec each way, 2 x 10 reps 3 way stretch with strap, HS, abd, add, 30 sec each. Seated piriformis and ITB stretch on mat, 2 x 30 sec each side. Heel raises with calf stretch on step, SLS, 2 x 10 reps each leg. Standing on upside down BOSU in parallel bars, lateral weight shifts, ant/post, mini squats x 10 each, no UE support, CGA.  08/16/22 Bike L3 x 6 min Step ups 4 in box on airex x10 each 10lb shoulder Ext standing on airex 2x12 20lb resisted side step over WaTE bar x5 Heel raises vlack bar 2x10 Rows & Lats 35lb 2x10 Leg press 50lb 2x10 Seated roll out lumbar stretch forward and to the side LLE HS, Pifirormis, ITB, Glute, Single K2C, lower trunk rotation stretch stretch, MHP to lumbar spine STM to L piriformis area with thea gun  08/11/22 NuStep L5 x 6 min Leg Press 40lb 2x12 6in step up x 10 each Step ups 4in box on airex x10 each Seated roll out lumbar stretch  forward and to the side LLE HS, Pifirormis, ITB, Glute, Single K2C, lower trunk rotation stretch stretch STM to L piriformis area with thea gun    08/02/22 Bike L3 x 6 minutes B HS PROM assessment- R 54, L 48 Supine over physioball, bridge, bridge with hip IR, x 10 each, bridge and roll ball side to side x 10, Dead bugs, 2 x 5 sets Heel raises on step, 2 x 10 Step ups, forward, side, crossover, 5 reps each   07/28/22 NuStep L5 x 6 min Hamstring curls 35lb 2x10 Leg Ext 10lb 2x10 Hip ext & abd 5lb x10 each  Leg press 30lb 2x15 6in step ups x 10 each  Supine on heat pad LE stretching, HS, Glut, Piriformis, ITB Bridges 2x10 STM to L glut and lumbar spine area use of theragun  07/26/22 NuStep L 5 x 6 min Standing rows 10lb 2x10 Shoulder Ext 10lb 2x10 Seated lat pulls 35lb 2x10 Supine on heat pad LE stretching, HS, Glut, Piriformis, ITB  STM to L glut and lumbar spine area use of theragun  07/21/22 Recumbent bike L4 x 6 minutes Deep STM to L piriformis F/B passive stretch along muscle belly. Supine isometric hip add and abd 10 x 5 sec each direction Dead bug ball squeeze for abdominal activation 10 x 5 sec holds Dead bugs 10 reps each way, required rest breaks Lower trunk rotation, hold for deep breath at each end. SKTC 3 x 15 sec on each side. Seated forward lean over physioball, 4 x 15 straight, 4 x 15 sec to each side diagonally. Squats while holding 4# ball in BUE-  2 x 10 reps  07/19/22 NuStep L5 x 6 minutes Supine low back stretches including 3 way hip stretch with strap, ITB, glut, and piriformis stretches, HEP updated to include Self STM using tennis ball on L piriformis. Additional passive piriformis stretch Clamshells against G tband resistance, 2 x 10 reps Post pelvic tilts in supine- March while holding PPT   07/14/22 Recumbent bike L4 x 6 minutes Supine stretch with strap, HS, add, abd, 2 x 20 sec each leg Bridge with hip abd resistance from G tband, 10  reps Single leg bridge x 10 with each leg Dead bug, legs only, arms remain pointed to ceiling. Thomas stretch, 30 sec B Seated HS stretch, 30 sec each leg Standing hip flexor stretch against wall. Standing shoulder ext, rows, ER against G tband, 2 x 10 reps each Paloff press 20# 2 x 10 each side  07/12/22 NuStep L5  x 6 minutes Seated piriformis and ITB stretches, HS stretch Thomas stretch STM with tennis ball to piriformis  Supine- Bridge with Tband abd resistance, clamshells, 10 reps each Dynamic stretching, butt kicks, frogger, hip flexor wall stretch, HS stretch.   PATIENT EDUCATION:  Education details: POC, initial HEP Person educated: Patient Education method: Programmer, multimedia, Demonstration, and Handouts Education comprehension: verbalized understanding and returned demonstration   HOME EXERCISE PROGRAM: Patient reports that he already performs DKTC, SKTC, B bridge, LTR, HS stretch against wall, standing calf stretch  [id number]  [id number]   ASSESSMENT:  CLINICAL IMPRESSION: Pt reports no more nose bleeds. Reviewed and updated HEP to emphasize STM, stretch, strengthen to L piriformis and gluts. Patient to perform and return next week for final review.    OBJECTIVE IMPAIRMENTS: Abnormal gait, decreased activity tolerance, decreased balance, decreased coordination, decreased mobility, difficulty walking, decreased ROM, decreased strength, impaired flexibility, improper body mechanics, and pain.   ACTIVITY LIMITATIONS: lifting, bending, standing, squatting, sleeping, stairs, and locomotion level  PARTICIPATION LIMITATIONS: meal prep, cleaning, driving, shopping, community activity, and yard work  PERSONAL FACTORS: Past/current experiences are also affecting patient's functional outcome.   REHAB POTENTIAL: Good  CLINICAL DECISION MAKING: Stable/uncomplicated  EVALUATION COMPLEXITY: Moderate   GOALS: Goals reviewed with patient? Yes  SHORT TERM GOALS: Target date:  07/26/2022  I with basic HEP Baseline: Goal status: ongoing  LONG TERM GOALS: Target date: 09/20/22  I with final HEP Baseline:  Goal status:ongoing  2.  Patient will score at least 52/56 on BERG Baseline: TBD Goal status: ongoing  3.  Patient will be able to ambulate at least 1 mile with back pain < 3/10 Baseline: 8/10, limits walking. 11/21- Patient has been riding his bike more than walking. Goal status: onoging  4.  Patient will score a max of 8 on [employer] Disability scale to demonstrate significant improvement in back pain Baseline: 15 Goal status: ongoing  5.  Increase L hip strength to at least 4/5 Baseline: 3+,4-, 11/21-4- Goal status: ongoing  PLAN: PT FREQUENCY: 2x/week  PT DURATION: 12 weeks  PLANNED INTERVENTIONS: Therapeutic exercises, Therapeutic activity, Neuromuscular re-education, Balance training, Gait training, Patient/Family education, Self Care, Joint mobilization, Stair training, Dry Needling, Electrical stimulation, Spinal mobilization, Cryotherapy, Moist heat, Traction, Ionotophoresis 4mg /ml Dexamethasone, and Manual therapy.  PLAN FOR NEXT SESSION: trunk stabilization  DPT 08/23/22 10:56 AM  08/23/2022, 10:56 AM

## 2022-08-26 ENCOUNTER — Ambulatory Visit: Payer: Medicare HMO | Admitting: Orthopaedic Surgery

## 2022-08-30 ENCOUNTER — Ambulatory Visit: Payer: Medicare HMO | Admitting: Physical Therapy

## 2022-08-30 ENCOUNTER — Encounter: Payer: Self-pay | Admitting: Physical Therapy

## 2022-08-30 DIAGNOSIS — R262 Difficulty in walking, not elsewhere classified: Secondary | ICD-10-CM

## 2022-08-30 DIAGNOSIS — R293 Abnormal posture: Secondary | ICD-10-CM | POA: Diagnosis not present

## 2022-08-30 DIAGNOSIS — M5416 Radiculopathy, lumbar region: Secondary | ICD-10-CM | POA: Diagnosis not present

## 2022-08-30 DIAGNOSIS — R2681 Unsteadiness on feet: Secondary | ICD-10-CM | POA: Diagnosis not present

## 2022-08-30 DIAGNOSIS — R2689 Other abnormalities of gait and mobility: Secondary | ICD-10-CM | POA: Diagnosis not present

## 2022-08-30 DIAGNOSIS — R600 Localized edema: Secondary | ICD-10-CM | POA: Diagnosis not present

## 2022-08-30 DIAGNOSIS — M5459 Other low back pain: Secondary | ICD-10-CM

## 2022-08-30 DIAGNOSIS — R278 Other lack of coordination: Secondary | ICD-10-CM | POA: Diagnosis not present

## 2022-08-30 DIAGNOSIS — M6281 Muscle weakness (generalized): Secondary | ICD-10-CM

## 2022-08-30 NOTE — Therapy (Signed)
OUTPATIENT PHYSICAL THERAPY THORACOLUMBAR TREATMENT  PHYSICAL THERAPY DISCHARGE SUMMARY  Visits from Start of Care: 15  Current functional level related to goals / functional outcomes: Pain much improved. Has exercises to continue to progress.   Remaining deficits: Tightness in L piriformis   Education / Equipment: HEP   Patient agrees to discharge. Patient goals were partially met. Patient is being discharged due to maximized rehab potential.    Patient Name: Ricardo Stewart MRN: [id number] DOB:10/22/1945, 76 y.o., male Today's Date: 08/30/2022   PT End of Session - 08/30/22 0932     Visit Number 15    Date for PT Re-Evaluation 09/20/22    PT Start Time 0928    PT Stop Time 1010    PT Time Calculation (min) 42 min    Activity Tolerance Patient tolerated treatment well;Patient limited by pain    Behavior During Therapy [facility] for tasks assessed/performed            Past Medical History:  Diagnosis Date   Arthritis    hips and spine   Hypertension    Peripheral neuropathy    bilat   Past Surgical History:  Procedure Laterality Date   FRACTURE SURGERY Right    radius and collar bone   TOTAL HIP ARTHROPLASTY Right 07/19/2019   Procedure: RIGHT TOTAL HIP ARTHROPLASTY ANTERIOR APPROACH;  Surgeon: Mcarthur Rossetti, MD;  Location: [facility];  Service: Orthopedics;  Laterality: Right;   Patient Active Problem List   Diagnosis Date Noted   Status post total replacement of right hip 07/19/2019   Unilateral primary osteoarthritis, right hip 06/17/2019   Lumbar radiculopathy 09/24/2018   Foraminal stenosis of lumbar region 09/24/2018   Spondylosis without myelopathy or radiculopathy, lumbar region 08/29/2016   Chronic left-sided low back pain without sciatica 08/29/2016    PCP: Kathyrn Lass  REFERRING PROVIDER: Marybelle Killings, MD   REFERRING DIAG: M54.16 (ICD-10-CM) - Lumbar radiculopathy M54.50,G89.29 (ICD-10-CM) - Chronic left-sided low back pain without sciatica    Rationale for Evaluation and Treatment Rehabilitation  THERAPY DIAG:  Abnormal posture  Muscle weakness (generalized)  Difficulty in walking, not elsewhere classified  Other low back pain  Unsteadiness on feet  Other lack of coordination  ONSET DATE: 06/10/2022   SUBJECTIVE:                                                                                                                                                                                           SUBJECTIVE STATEMENT: Patient reports  that he was a bit more sore yesterday after being too active the day before, but today feels better. The back is  not stopping him from doing anything.  PERTINENT HISTORY:  HPI 76 year old male with back problems that radiates in his left leg for several years.  Patient had previous right total hip arthroplasty by Dr. [name] and states about 9 months after his total hip he started having some gradual recurrence of his left leg symptoms.  He has had worsening pain for the last few months radiating into his buttocks and his left thigh.  He denies numbness.  He uses an exercise bike and states walking is more difficult.  Some of his pain is located directly over the ischial tuberosity.   PAIN:  Are you having pain? Yes: NPRS scale: 0/10 Pain location: low back, buttock on L Pain description: discomfort Aggravating factors: walking/standing Relieving factors: lying down and pain meds   PRECAUTIONS: None  WEIGHT BEARING RESTRICTIONS: No  FALLS:  Has patient fallen in last 6 months? No  LIVING ENVIRONMENT: Lives with: lives with their spouse Lives in: House/apartment Stairs: Yes: Internal: 14 steps; on right going up Has following equipment at home: None  OCCUPATION: Retired, likes to walk daily, has started riding a stationary bike more now.  PLOF: Independent  PATIENT GOALS: Be able to walk daily without pain   OBJECTIVE:   DIAGNOSTIC FINDINGS:   Imaging: IMPRESSION: 1. At L4-5 there is a broad-based disc bulge with a right lateral disc osteophyte complex. Moderate bilateral facet arthropathy. Moderate spinal stenosis and bilateral lateral recess stenosis. No left foraminal stenosis. Moderate right foraminal stenosis. 2. At L5-S1 there is a broad-based disc bulge eccentric towards the left. Severe bilateral facet arthropathy with ligamentum flavum infolding. Left lateral recess stenosis. Mild spinal stenosis. Severe left foraminal stenosis. Mild right foraminal stenosis. 3 mm left facet posterior extra-spinal synovial cyst. 3. Interval resolution of large intraspinal synovial cyst at L5-S1 and a smaller intraspinal synovial cyst at L4-5.Previous MRI scan lumbar 2019 showed disc degeneration with moderate stenosis. He had left lateral recess stenosis at L5-S1 with severe foraminal stenosis. Synovial cyst have been present on previous MRI that had resolved by 2019. We will set patient up for some physical therapy. If he does not respond to therapy then we will consider repeat lumbar MRI imaging. PT at [place] which is convenient for him.   Previous MRI scan lumbar 2019 showed disc degeneration with moderate stenosis. He had left lateral recess stenosis at L5-S1 with severe foraminal stenosis. Synovial cyst have been present on previous MRI that had resolved by 2019. We will set patient up for some physical therapy. If he does not respond to therapy then we will consider repeat lumbar MRI imaging. PT at [place] which is convenient for him.    SCREENING FOR RED FLAGS: Bowel or bladder incontinence: No Spinal tumors: No Cauda equina syndrome: No Compression fracture: No Abdominal aneurysm: No  COGNITION:  Overall cognitive status: Within functional limits for tasks assessed     SENSATION: Light touch: WFL  MUSCLE LENGTH: Hamstrings: Right 45 deg; Left 45 deg Thomas test:Both sides appear tight  POSTURE: decreased lumbar  lordosis and decreased thoracic kyphosis  PALPATION: TTP along L piriformis  LUMBAR ROM:   AROM eval  Flexion To toes  Extension Mildly limited  Right lateral flexion To knee  Left lateral flexion 3" above knee  Right rotation WNL  Left rotation WNL   (Blank rows = not tested)  LOWER EXTREMITY ROM:   Generally stiff throughout, but WFL  LOWER EXTREMITY MMT:  All other strength WNL in BLE  MMT Right eval Left eval  Hip flexion    Hip extension  4-  Hip abduction  3+  Hip adduction    Hip internal rotation    Hip external rotation    Knee flexion    Knee extension    Ankle dorsiflexion    Ankle plantarflexion    Ankle inversion    Ankle eversion     (Blank rows = not tested)  LUMBAR SPECIAL TESTS:  Straight leg raise test: Negative and Slump test: Positive  FUNCTIONAL TESTS:  Berg Balance Scale: TBD  GAIT: Distance walked: 57' Assistive device utilized: None Level of assistance: Complete Independence Comments: Antalgic gait with favoring of LLE.    TODAY'S TREATMENT:  08/30/22 NuStep L5 x 6 minutes Functional status re-assessed for D/C Deep tissue massage using tennis ball in sitting for L piriformis. Mini squats on BOSU ball x 20 B side step on Airex plank x 5 reps in each direction.  08/23/22 NuStep L5 x 6 minutes Standing side steps against G tband resistance, 3 x 5 steps to each side Standing hip abd against G tband resistance Standing shoulder rows, ext, ER against G tband resistance 2 x 10 reps. Step ups onto 6" step, 10 reps each leg. Seated deep tissue massage using tennis ball.  Continue  with ball under L hip and stretch gluts and piriformis in sitting Side step ups and crossover step ups x 5 each   08/18/22 NuStep L5 x 6 minutes STM to L piriformis with tennis ball, with stretches Isometric hip abd/add x 5 sec each way, 2 x 10 reps 3 way stretch with strap, HS, abd, add, 30 sec each. Seated piriformis and ITB stretch on mat, 2 x 30 sec  each side. Heel raises with calf stretch on step, SLS, 2 x 10 reps each leg. Standing on upside down BOSU in parallel bars, lateral weight shifts, ant/post, mini squats x 10 each, no UE support, CGA.  08/16/22 Bike L3 x 6 min Step ups 4 in box on airex x10 each 10lb shoulder Ext standing on airex 2x12 20lb resisted side step over WaTE bar x5 Heel raises vlack bar 2x10 Rows & Lats 35lb 2x10 Leg press 50lb 2x10 Seated roll out lumbar stretch forward and to the side LLE HS, Pifirormis, ITB, Glute, Single K2C, lower trunk rotation stretch stretch, MHP to lumbar spine STM to L piriformis area with thea gun  08/11/22 NuStep L5 x 6 min Leg Press 40lb 2x12 6in step up x 10 each Step ups 4in box on airex x10 each Seated roll out lumbar stretch forward and to the side LLE HS, Pifirormis, ITB, Glute, Single K2C, lower trunk rotation stretch stretch STM to L piriformis area with thea gun  08/02/22 Bike L3 x 6 minutes B HS PROM assessment- R 54, L 48 Supine over physioball, bridge, bridge with hip IR, x 10 each, bridge and roll ball side to side x 10, Dead bugs, 2 x 5 sets Heel raises on step, 2 x 10 Step ups, forward, side, crossover, 5 reps each  07/28/22 NuStep L5 x 6 min Hamstring curls 35lb 2x10 Leg Ext 10lb 2x10 Hip ext & abd 5lb x10 each  Leg press 30lb 2x15 6in step ups x 10 each  Supine on heat pad LE stretching, HS, Glut, Piriformis, ITB Bridges 2x10 STM to L glut and lumbar spine area use of theragun  07/26/22 NuStep L 5 x 6 min Standing rows 10lb 2x10 Shoulder Ext 10lb 2x10 Seated lat  pulls 35lb 2x10 Supine on heat pad LE stretching, HS, Glut, Piriformis, ITB  STM to L glut and lumbar spine area use of theragun  07/21/22 Recumbent bike L4 x 6 minutes Deep STM to L piriformis F/B passive stretch along muscle belly. Supine isometric hip add and abd 10 x 5 sec each direction Dead bug ball squeeze for abdominal activation 10 x 5 sec holds Dead bugs 10 reps each way,  required rest breaks Lower trunk rotation, hold for deep breath at each end. SKTC 3 x 15 sec on each side. Seated forward lean over physioball, 4 x 15 straight, 4 x 15 sec to each side diagonally. Squats while holding 4# ball in BUE- 2 x 10 reps  07/19/22 NuStep L5 x 6 minutes Supine low back stretches including 3 way hip stretch with strap, ITB, glut, and piriformis stretches, HEP updated to include Self STM using tennis ball on L piriformis. Additional passive piriformis stretch Clamshells against G tband resistance, 2 x 10 reps Post pelvic tilts in supine- March while holding PPT  07/14/22 Recumbent bike L4 x 6 minutes Supine stretch with strap, HS, add, abd, 2 x 20 sec each leg Bridge with hip abd resistance from G tband, 10 reps Single leg bridge x 10 with each leg Dead bug, legs only, arms remain pointed to ceiling. Thomas stretch, 30 sec B Seated HS stretch, 30 sec each leg Standing hip flexor stretch against wall. Standing shoulder ext, rows, ER against G tband, 2 x 10 reps each Paloff press 20# 2 x 10 each side  07/12/22 NuStep L5  x 6 minutes Seated piriformis and ITB stretches, HS stretch Thomas stretch STM with tennis ball to piriformis  Supine- Bridge with Tband abd resistance, clamshells, 10 reps each Dynamic stretching, butt kicks, frogger, hip flexor wall stretch, HS stretch.   PATIENT EDUCATION:  Education details: POC, initial HEP Person educated: Patient Education method: Consulting civil engineer, Demonstration, and Handouts Education comprehension: verbalized understanding and returned demonstration   HOME EXERCISE PROGRAM: Patient reports that he already performs DKTC, SKTC, B bridge, LTR, HS stretch against wall, standing calf stretch  [id number]  [id number]   ASSESSMENT:  CLINICAL IMPRESSION: Pt reports no new issues. The HEP is going well. He progressed and met all goals except Owestry, but has tools to continue to progress. D/C PT.    OBJECTIVE  IMPAIRMENTS: Abnormal gait, decreased activity tolerance, decreased balance, decreased coordination, decreased mobility, difficulty walking, decreased ROM, decreased strength, impaired flexibility, improper body mechanics, and pain.   ACTIVITY LIMITATIONS: lifting, bending, standing, squatting, sleeping, stairs, and locomotion level  PARTICIPATION LIMITATIONS: meal prep, cleaning, driving, shopping, community activity, and yard work  PERSONAL FACTORS: Past/current experiences are also affecting patient's functional outcome.   REHAB POTENTIAL: Good  CLINICAL DECISION MAKING: Stable/uncomplicated  EVALUATION COMPLEXITY: Moderate   GOALS: Goals reviewed with patient? Yes  SHORT TERM GOALS: Target date: 07/26/2022  I with basic HEP Baseline: Goal status: met  LONG TERM GOALS: Target date: 09/20/22  I with final HEP Baseline:  Goal status: met  2.  Patient will score at least 52/56 on BERG Baseline: TBD Goal status: N/A  3.  Patient will be able to ambulate at least 1 mile with back pain < 3/10 Baseline: 8/10, limits walking. 11/21- Patient has been riding his bike more than walking. 12/19- Walks 2 miles Goal status: met  4.  Patient will score a max of 8 on Owestry Disability scale to demonstrate significant improvement in back pain Baseline: 15, 12/19-  16 Goal status: not met  5.  Increase L hip strength to at least 4/5 Baseline: 3+,4-, 11/21-4-, 12/19-4/5 Goal status: met  PLAN: PT FREQUENCY: 2x/week  PT DURATION: 12 weeks  PLANNED INTERVENTIONS: Therapeutic exercises, Therapeutic activity, Neuromuscular re-education, Balance training, Gait training, Patient/Family education, Self Care, Joint mobilization, Stair training, Dry Needling, Electrical stimulation, Spinal mobilization, Cryotherapy, Moist heat, Traction, Ionotophoresis 65m/ml Dexamethasone, and Manual therapy.  PLAN FOR NEXT SESSION: trunk stabilization  SEthel RanaDPT 08/30/22 10:07 AM  08/30/2022,  10:07 AM

## 2022-09-01 ENCOUNTER — Other Ambulatory Visit: Payer: Self-pay

## 2022-09-01 ENCOUNTER — Encounter (HOSPITAL_BASED_OUTPATIENT_CLINIC_OR_DEPARTMENT_OTHER): Payer: Self-pay | Admitting: Emergency Medicine

## 2022-09-01 DIAGNOSIS — Z7982 Long term (current) use of aspirin: Secondary | ICD-10-CM | POA: Insufficient documentation

## 2022-09-01 DIAGNOSIS — R040 Epistaxis: Secondary | ICD-10-CM | POA: Diagnosis not present

## 2022-09-01 NOTE — ED Triage Notes (Signed)
Pt reports having a nose bleed X3 hours.  He has a similar episode to this one week ago but was able to easily control the bleed.  Bleeding controlled at this time.

## 2022-09-02 ENCOUNTER — Emergency Department (HOSPITAL_BASED_OUTPATIENT_CLINIC_OR_DEPARTMENT_OTHER)
Admission: EM | Admit: 2022-09-02 | Discharge: 2022-09-02 | Disposition: A | Discharge status: Home / Self Care | Payer: Medicare HMO | Attending: Emergency Medicine | Admitting: Emergency Medicine

## 2022-09-02 DIAGNOSIS — R040 Epistaxis: Secondary | ICD-10-CM

## 2022-09-02 MED ORDER — SILVER NITRATE-POT NITRATE 75-25 % EX MISC
1.0000 | Freq: Once | CUTANEOUS | Status: AC
  Administered 2022-09-02: 1 via TOPICAL
  Filled 2022-09-02: qty 10

## 2022-09-02 MED ORDER — OXYMETAZOLINE HCL 0.05 % NA SOLN
1.0000 | Freq: Once | NASAL | Status: AC
  Administered 2022-09-02: 1 via NASAL
  Filled 2022-09-02: qty 30

## 2022-09-02 NOTE — ED Provider Notes (Signed)
[facility] EMERGENCY DEPARTMENT  Provider Note  CSN: [id number] Arrival date & time: 09/01/22 2053  History Chief Complaint  Patient presents with   Epistaxis    Ricardo Stewart is a 76 y.o. male reports intermittent nose bleed throughout the day, stops and starts, always on the right. Not on a blood thinner. Had similar about 2 weeks ago he got to stop on his own. No injuries/trauma to the nose.    Home Medications Prior to Admission medications   Medication Sig Start Date End Date Taking? Authorizing Provider  aspirin 81 MG chewable tablet Chew 1 tablet (81 mg total) by mouth 2 (two) times daily. 07/20/19   Kathryne Hitch, MD  benazepril-hydrochlorthiazide (LOTENSIN HCT) 20-12.5 MG tablet Take 1 tablet by mouth daily.  07/31/16   [provider]  cholecalciferol (VITAMIN D) 25 MCG (1000 UT) tablet Take 1,000 Units by mouth daily.    [provider]  Coenzyme Q10 (COQ10) 100 MG CAPS Take 100 mg by mouth daily.    [provider]  ezetimibe (ZETIA) 10 MG tablet Take 10 mg by mouth daily. 06/04/22   [provider]  fluticasone (FLONASE) 50 MCG/ACT nasal spray Place 1-2 sprays into both nostrils daily as needed for allergies.  08/25/16   [provider]  gabapentin (NEURONTIN) 300 MG capsule Take 1 capsule (300 mg total) by mouth at bedtime. 07/22/19   Tyrell Antonio, MD  Glucosamine 750 MG TABS Take 750 mg by mouth 2 (two) times daily.    [provider]  ibuprofen (ADVIL) 200 MG tablet Take 400-600 mg by mouth every 8 (eight) hours as needed (pain.).    [provider]  magnesium oxide (MAG-OX) 400 MG tablet Take 400 mg by mouth daily.    [provider]  methocarbamol (ROBAXIN) 500 MG tablet Take 1 tablet (500 mg total) by mouth every 6 (six) hours as needed for muscle spasms. Patient not taking: Reported on 06/10/2022 07/20/19   Kathryne Hitch, MD  methylPREDNISolone (MEDROL) 4 MG tablet  Medrol dose pack. Take as instructed Patient not taking: Reported on 06/10/2022 09/18/19   Kathryne Hitch, MD  Omega-3 Fatty Acids (FISH OIL) 1000 MG CAPS Take 1,000 mg by mouth daily.    [provider]  oxyCODONE (OXY IR/ROXICODONE) 5 MG immediate release tablet Take 1-2 tablets (5-10 mg total) by mouth every 4 (four) hours as needed for moderate pain (pain score 4-6). Patient not taking: Reported on 06/10/2022 07/23/19   Kathryne Hitch, MD  simvastatin (ZOCOR) 80 MG tablet Take 40 mg by mouth daily.  07/25/16   [provider]  vitamin C (ASCORBIC ACID) 500 MG tablet Take 500 mg by mouth daily.    [provider]     Allergies    Patient has no known allergies.   Review of Systems   Review of Systems Please see HPI for pertinent positives and negatives  Physical Exam BP (!) 156/86 (BP Location: Right Arm)   Pulse 86   Temp 98 F (36.7 C) (Oral)   Resp 18   Ht 5\' 7"  (1.702 m)   Wt 97.5 kg   SpO2 98%   BMI 33.67 kg/m   Physical Exam Vitals and nursing note reviewed.  HENT:     Head: Normocephalic.     Nose:     Comments: No active bleeding, site of prior bleeding identified on R nasal septum.  Eyes:     Extraocular Movements: Extraocular movements  intact.  Pulmonary:     Effort: Pulmonary effort is normal.  Musculoskeletal:        General: Normal range of motion.     Cervical back: Neck supple.  Skin:    Findings: No rash (on exposed skin).  Neurological:     Mental Status: He is alert and oriented to person, place, and time.  Psychiatric:        Mood and Affect: Mood normal.     ED Results / Procedures / Treatments   EKG None  Procedures .Epistaxis Management  Date/Time: 09/02/2022 1:20 AM  Performed by: Pollyann Savoy, MD Authorized by: Pollyann Savoy, MD   Consent:    Consent obtained:  Verbal   Consent given by:  Patient Anesthesia:    Anesthesia method:  Topical application   Topical anesthesia:  2% lidocaine with epi. Procedure details:    Treatment site:  R anterior   Treatment method:  Silver nitrate   Treatment complexity:  Limited   Treatment episode: initial   Post-procedure details:    Assessment:  Bleeding stopped   Procedure completion:  Tolerated well, no immediate complications   Medications Ordered in the ED Medications  oxymetazoline (AFRIN) 0.05 % nasal spray 1 spray (1 spray Each Nare Given 09/02/22 0118)  silver nitrate applicators applicator 1 Stick (1 Stick Topical Given 09/02/22 0118)    Initial Impression and Plan  Patient here with intermittent anterior nose bleed. Not bleeding at the time of my evaluation. Source was cauterized with silver nitrate. Patient given instructions on home management of nosebleed including Afrin, holding pressure and using saline drops for moisturizer. Recommend RTED if those measures are unsuccessful.   ED Course       MDM Rules/Calculators/A&P Medical Decision Making Problems Addressed: Epistaxis: acute illness or injury  Risk OTC drugs. Prescription drug management.    Final Clinical Impression(s) / ED Diagnoses Final diagnoses:  Epistaxis    Rx / DC Orders ED Discharge Orders     None        Pollyann Savoy, MD 09/02/22 [phone]

## 2022-09-02 NOTE — ED Notes (Signed)
Pt reports nose bleed that started around 1830 last night. And bled x 3 hrs.  Bleeding controlled at this time

## 2022-09-02 NOTE — Discharge Instructions (Signed)
You should use a saline solution, such as AryGel, to keep your nasal passages moist.

## 2022-09-08 ENCOUNTER — Ambulatory Visit
Admission: RE | Admit: 2022-09-08 | Discharge: 2022-09-08 | Disposition: A | Discharge status: Home / Self Care | Payer: Medicare HMO | Source: Ambulatory Visit | Attending: Orthopaedic Surgery | Admitting: Orthopaedic Surgery

## 2022-09-08 DIAGNOSIS — M4316 Spondylolisthesis, lumbar region: Secondary | ICD-10-CM | POA: Diagnosis not present

## 2022-09-08 DIAGNOSIS — M545 Low back pain, unspecified: Secondary | ICD-10-CM | POA: Diagnosis not present

## 2022-09-08 DIAGNOSIS — M48061 Spinal stenosis, lumbar region without neurogenic claudication: Secondary | ICD-10-CM | POA: Diagnosis not present

## 2022-09-14 ENCOUNTER — Ambulatory Visit: Payer: Medicare HMO | Admitting: Orthopaedic Surgery

## 2022-09-14 ENCOUNTER — Encounter: Payer: Self-pay | Admitting: Orthopaedic Surgery

## 2022-09-14 VITALS — BP 168/71 | HR 64 | Ht 67.0 in | Wt 215.0 lb

## 2022-09-14 DIAGNOSIS — M47816 Spondylosis without myelopathy or radiculopathy, lumbar region: Secondary | ICD-10-CM

## 2022-09-14 DIAGNOSIS — M48061 Spinal stenosis, lumbar region without neurogenic claudication: Secondary | ICD-10-CM | POA: Diagnosis not present

## 2022-09-14 NOTE — Progress Notes (Signed)
Office Visit Note   [name]           Date of Birth: 1946-05-12           MRN: [id number] Visit Date: 09/14/2022              Requested by: Kathyrn Lass, [place],  [place] [address] PCP: Kathyrn Lass, MD   Assessment & Plan: Visit Diagnoses:  1. Foraminal stenosis of lumbar region   2. Spondylosis without myelopathy or radiculopathy, lumbar region     Plan: Will set patient up for single epidural injection for his ongoing symptoms.  At this point he is not having claudication symptoms significant enough to consider referral with Dr. Laurance Flatten for decompression.  Patient would additionally asked some questions about some surgeons in [facility] I am not familiar with.  He will continue with his home exercise strengthening program and hopefully get good relief the epidural I can follow up with him after the epidural.  Follow-Up Instructions: No follow-ups on file.   Orders:  Orders Placed This Encounter  Procedures   Ambulatory referral to Physical Medicine Rehab   No orders of the defined types were placed in this encounter.     Procedures: No procedures performed   Clinical Data: No additional findings.   Subjective: Chief Complaint  Patient presents with   Lower Back - Pain, Follow-up    MRI review    HPI 77 yo male returns states he is going to therapy which has been helping with flexibility.  He still has some issues.  Finished therapy on the 19th.  He gets relief when he lays down.  Ibuprofen has helped no fever chills no bowel bladder associated symptoms.  MRI is reviewed today.  He has some degenerative changes at multiple levels.  Progression has occurred at L4-5 and L5-S1 where there is now severe spinal stenosis but patient is not having claudication symptoms.  Review of Systems previous right total of arthroplasty by Dr. [name].  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   Objective: Vital Signs: BP (!) 168/71    Pulse 64   Ht 5\' 7"  (1.702 m)   Wt 215 lb (97.5 kg)   BMI 33.67 kg/m   Physical Exam Constitutional:      Appearance: He is well-developed.  HENT:     Head: Normocephalic and atraumatic.     Right Ear: External ear normal.     Left Ear: External ear normal.  Eyes:     Pupils: Pupils are equal, round, and reactive to light.  Neck:     Thyroid: No thyromegaly.     Trachea: No tracheal deviation.  Cardiovascular:     Rate and Rhythm: Normal rate.  Pulmonary:     Effort: Pulmonary effort is normal.     Breath sounds: No wheezing.  Abdominal:     General: Bowel sounds are normal.     Palpations: Abdomen is soft.  Musculoskeletal:     Cervical back: Neck supple.  Skin:    General: Skin is warm and dry.     Capillary Refill: Capillary refill takes less than 2 seconds.  Neurological:     Mental Status: He is alert and oriented to person, place, and time.  Psychiatric:        Behavior: Behavior normal.        Thought Content: Thought content normal.        Judgment: Judgment normal.  Ortho Exam patient moves better from sitting to standing.  Anterior tib gastrocsoleus is strong negative logroll to hips knees reach full extension.  Good capillary refill no lower extremity atrophy.  Specialty Comments:  MRI LUMBAR SPINE WITHOUT CONTRAST   TECHNIQUE: Multiplanar, multisequence MR imaging of the lumbar spine was performed. No intravenous contrast was administered.   COMPARISON:  MRI L Spine 02/03/18   FINDINGS: Segmentation:  Standard.   Alignment: Mild retrolisthesis of L3 on L4 and L4 on L5. Mild anterolisthesis of L5 on S1. These findings have slightly progressed compared to 2019.   Vertebrae: There are reactive degenerative endplate marrow changes at L4-L5 and L5-S1.   Conus medullaris and cauda equina: Conus extends to the L2-L3 disc space level. Conus and cauda equina appear normal.   Paraspinal and other soft tissues: T2 hyperintense lesion at  the inferior pole of the left kidney is favored to represent a simple renal cyst.   Disc levels:   T10-T11: Eccentric left disc bulge. No overall spinal canal narrowing. Mild-to-moderate left facet degenerative change. Likely moderate left neural foraminal narrowing.   T11-T12: Eccentric left disc bulge. Mild spinal canal narrowing. Moderate left facet degenerative change. Severe left neural foraminal narrowing. This is likely progressed compared to 2019, but this was only imaged in the sagittal plane on prior exam.   T12-L1: Minimal disc bulge. Mild bilateral facet degenerative change. Mild spinal canal narrowing. No significant neural foraminal narrowing. Findings are unchanged from prior exam.   L1-L2: Minimal eccentric left disc bulge. Mild bilateral facet degenerative change. No spinal canal narrowing. Mild left neural foraminal narrowing. Findings are unchanged from prior exam.   L2-L3: Minimal eccentric left disc bulge. Mild overall spinal canal narrowing. Mild bilateral facet degenerative change. Mild left neural foraminal narrowing. These findings are unchanged from prior exam.   L3-L4: Circumferential disc bulge. Moderate bilateral facet degenerative change with fluid in the facet joints. There is severe spinal canal stenosis, increased from prior exam. Moderate to severe right neural foraminal narrowing, increased from prior exam. Mild left neural foraminal narrowing slightly increased from prior exam. There is also interval increase in the degree of interspinous degenerative change with formation of a new extra-spinal bursal cysts (series 14, image 39).   L4-L5: Severe disc space loss. Severe bilateral facet degenerative change. Ligamentum flavum hypertrophy. Severe spinal canal stenosis, slightly progressed from prior exam. Moderate to severe right and severe left neural foraminal stenosis, slightly increased from prior exam.   L5-S1: Severe disc space loss.  Severe bilateral facet degenerative change. Severe spinal canal stenosis. Severe left and moderate to severe right neural foraminal stenosis. Findings are likely slightly progressed from prior exam.   IMPRESSION: 1. Compared to prior exam, there is progressive degenerative change in the visualized thoracic and lumbar spine, most notably at T11-T12 and L3-L4. At T11-T12, there is now severe left sided neuroforaminal stenosis. At L3-L4 there is now severe spinal canal stenosis. 2. Progressive degenerative change is also present at L4-L5 and L5-S1 where there is now severe spinal canal stenosis (previously moderate). 3. Additional degenerative changes as above, including moderate to severe neuroforaminal stenosis at L3-L4 (right), L4-L5 (bilateral), and L5-S1 (left). 4. Interval increase in the degree of interspinous degenerative change at L3-L4 and L4-L5 with formation of a new extra-spinal bursal cysts.     Electronically Signed   By: Lorenza Cambridge M.D.   On: 09/13/2022 06:53  Imaging: Study Result  Narrative & Impression  CLINICAL DATA:  Low back pain  EXAM: MRI LUMBAR SPINE WITHOUT CONTRAST   TECHNIQUE: Multiplanar, multisequence MR imaging of the lumbar spine was performed. No intravenous contrast was administered.   COMPARISON:  MRI L Spine 02/03/18   FINDINGS: Segmentation:  Standard.   Alignment: Mild retrolisthesis of L3 on L4 and L4 on L5. Mild anterolisthesis of L5 on S1. These findings have slightly progressed compared to 2019.   Vertebrae: There are reactive degenerative endplate marrow changes at L4-L5 and L5-S1.   Conus medullaris and cauda equina: Conus extends to the L2-L3 disc space level. Conus and cauda equina appear normal.   Paraspinal and other soft tissues: T2 hyperintense lesion at the inferior pole of the left kidney is favored to represent a simple renal cyst.   Disc levels:   T10-T11: Eccentric left disc bulge. No overall spinal  canal narrowing. Mild-to-moderate left facet degenerative change. Likely moderate left neural foraminal narrowing.   T11-T12: Eccentric left disc bulge. Mild spinal canal narrowing. Moderate left facet degenerative change. Severe left neural foraminal narrowing. This is likely progressed compared to 2019, but this was only imaged in the sagittal plane on prior exam.   T12-L1: Minimal disc bulge. Mild bilateral facet degenerative change. Mild spinal canal narrowing. No significant neural foraminal narrowing. Findings are unchanged from prior exam.   L1-L2: Minimal eccentric left disc bulge. Mild bilateral facet degenerative change. No spinal canal narrowing. Mild left neural foraminal narrowing. Findings are unchanged from prior exam.   L2-L3: Minimal eccentric left disc bulge. Mild overall spinal canal narrowing. Mild bilateral facet degenerative change. Mild left neural foraminal narrowing. These findings are unchanged from prior exam.   L3-L4: Circumferential disc bulge. Moderate bilateral facet degenerative change with fluid in the facet joints. There is severe spinal canal stenosis, increased from prior exam. Moderate to severe right neural foraminal narrowing, increased from prior exam. Mild left neural foraminal narrowing slightly increased from prior exam. There is also interval increase in the degree of interspinous degenerative change with formation of a new extra-spinal bursal cysts (series 14, image 39).   L4-L5: Severe disc space loss. Severe bilateral facet degenerative change. Ligamentum flavum hypertrophy. Severe spinal canal stenosis, slightly progressed from prior exam. Moderate to severe right and severe left neural foraminal stenosis, slightly increased from prior exam.   L5-S1: Severe disc space loss. Severe bilateral facet degenerative change. Severe spinal canal stenosis. Severe left and moderate to severe right neural foraminal stenosis. Findings are  likely slightly progressed from prior exam.   IMPRESSION: 1. Compared to prior exam, there is progressive degenerative change in the visualized thoracic and lumbar spine, most notably at T11-T12 and L3-L4. At T11-T12, there is now severe left sided neuroforaminal stenosis. At L3-L4 there is now severe spinal canal stenosis. 2. Progressive degenerative change is also present at L4-L5 and L5-S1 where there is now severe spinal canal stenosis (previously moderate). 3. Additional degenerative changes as above, including moderate to severe neuroforaminal stenosis at L3-L4 (right), L4-L5 (bilateral), and L5-S1 (left). 4. Interval increase in the degree of interspinous degenerative change at L3-L4 and L4-L5 with formation of a new extra-spinal bursal cysts.     Electronically Signed   By: Lorenza Cambridge M.D.   On: 09/13/2022 06:53     PMFS History: Patient Active Problem List   Diagnosis Date Noted   Status post total replacement of right hip 07/19/2019   Unilateral primary osteoarthritis, right hip 06/17/2019   Lumbar radiculopathy 09/24/2018   Foraminal stenosis of lumbar region 09/24/2018   Spondylosis without  myelopathy or radiculopathy, lumbar region 08/29/2016   Chronic left-sided low back pain without sciatica 08/29/2016   Past Medical History:  Diagnosis Date   Arthritis    hips and spine   Hypertension    Peripheral neuropathy    bilat    No family history on file.  Past Surgical History:  Procedure Laterality Date   FRACTURE SURGERY Right    radius and collar bone   TOTAL HIP ARTHROPLASTY Right 07/19/2019   Procedure: RIGHT TOTAL HIP ARTHROPLASTY ANTERIOR APPROACH;  Surgeon: Mcarthur Rossetti, MD;  Location: [facility]S;  Service: Orthopedics;  Laterality: Right;   Social History   Occupational History   Not on file  Tobacco Use   Smoking status: Never   Smokeless tobacco: Never  Vaping Use   Vaping Use: Never used  Substance and Sexual Activity    Alcohol use: Yes    Alcohol/week: 1.0 standard drink of alcohol    Types: 1 Standard drinks or equivalent per week   Drug use: Never   Sexual activity: Not on file

## 2022-09-20 ENCOUNTER — Telehealth: Payer: Self-pay | Admitting: Physical Medicine and Rehabilitation

## 2022-09-20 NOTE — Telephone Encounter (Signed)
Patient states he is waiting on a call for an injection and is still waiting on a call for his appt. Please advise

## 2022-09-20 NOTE — Telephone Encounter (Signed)
Attempted to call patient and was unable to leave voicemail

## 2022-09-23 ENCOUNTER — Telehealth: Payer: Self-pay | Admitting: Physical Medicine and Rehabilitation

## 2022-09-23 NOTE — Telephone Encounter (Signed)
Patient is waiting on a appt for [facility] but has not received a cal for appt. He states he made an appt for Dr. Laurance Flatten on the 31st due to not getting a call back. Patient also wants a copy of his MRI and is wanting someone to call about both request. Please advise.. patient states he is not happy with his experience.

## 2022-09-23 NOTE — Telephone Encounter (Signed)
Spoke with patient and scheduled injection for 09/29/22 

## 2022-09-27 NOTE — Telephone Encounter (Signed)
I spoke with patient same day of message, he is scheduled and has appt dates.  We sorted the imaging auth/facility for him as well.  No further action needed at this time.

## 2022-09-29 ENCOUNTER — Ambulatory Visit: Payer: Self-pay

## 2022-09-29 ENCOUNTER — Ambulatory Visit: Payer: Medicare HMO | Admitting: Physical Medicine and Rehabilitation

## 2022-09-29 DIAGNOSIS — M48062 Spinal stenosis, lumbar region with neurogenic claudication: Secondary | ICD-10-CM | POA: Diagnosis not present

## 2022-09-29 DIAGNOSIS — M5416 Radiculopathy, lumbar region: Secondary | ICD-10-CM | POA: Diagnosis not present

## 2022-09-29 MED ORDER — METHYLPREDNISOLONE ACETATE 80 MG/ML IJ SUSP
80.0000 mg | Freq: Once | INTRAMUSCULAR | Status: AC
  Administered 2022-09-29: 80 mg

## 2022-09-29 NOTE — Patient Instructions (Signed)

## 2022-09-29 NOTE — Progress Notes (Signed)
Functional Pain Scale - descriptive words and definitions  Distressing (6)    Pain is present/unable to complete most ADLs limited by pain/sleep is difficult and active distraction is only marginal. Moderate range order  Average Pain 2   +Driver, -BT, -Dye Allergies.   Left buttock pain mostly. Intense after being on feet all day.  Affecting balance and sleep.  Xray to MRI seems to have dynamic listhesis of L5 on S1, S1 lumbarized

## 2022-10-11 NOTE — Procedures (Signed)
Lumbosacral Transforaminal Epidural Steroid Injection - Sub-Pedicular Approach with Fluoroscopic Guidance  [name]      Date of Birth: October 01, 1945 MRN: [id number] PCP: Kathyrn Lass, MD      Visit Date: 09/29/2022   Universal Protocol:    Date/Time: 09/29/2022  Consent Given By: the patient  Position: PRONE  Additional Comments: Vital signs were monitored before and after the procedure. Patient was prepped and draped in the usual sterile fashion. The correct patient, procedure, and site was verified.   Injection Procedure Details:   Procedure diagnoses: Lumbar radiculopathy [M54.16]    Meds Administered:  Meds ordered this encounter  Medications   methylPREDNISolone acetate (DEPO-MEDROL) injection 80 mg    Laterality: Bilateral  Location/Site: L4  Needle:5.0 in., 22 ga.  Short bevel or Quincke spinal needle  Needle Placement: Transforaminal  Findings:    -Comments: Excellent flow of contrast along the nerve, nerve root and into the epidural space.  Procedure Details: After squaring off the end-plates to get a true AP view, the C-arm was positioned so that an oblique view of the foramen as noted above was visualized. The target area is just inferior to the "nose of the scotty dog" or sub pedicular. The soft tissues overlying this structure were infiltrated with 2-3 ml. of 1% Lidocaine without Epinephrine.  The spinal needle was inserted toward the target using a "trajectory" view along the fluoroscope beam.  Under AP and lateral visualization, the needle was advanced so it did not puncture dura and was located close the 6 O'Clock position of the pedical in AP tracterory. Biplanar projections were used to confirm position. Aspiration was confirmed to be negative for CSF and/or blood. A 1-2 ml. volume of Isovue-250 was injected and flow of contrast was noted at each level. Radiographs were obtained for documentation purposes.   After attaining the desired flow  of contrast documented above, a 0.5 to 1.0 ml test dose of 0.25% Marcaine was injected into each respective transforaminal space.  The patient was observed for 90 seconds post injection.  After no sensory deficits were reported, and normal lower extremity motor function was noted,   the above injectate was administered so that equal amounts of the injectate were placed at each foramen (level) into the transforaminal epidural space.   Additional Comments:  No complications occurred Dressing: 2 x 2 sterile gauze and Band-Aid    Post-procedure details: Patient was observed during the procedure. Post-procedure instructions were reviewed.  Patient left the clinic in stable condition.

## 2022-10-11 NOTE — Progress Notes (Signed)
Ricardo Stewart - 77 y.o. male MRN [id number]  Date of birth: 08-17-1946  Office Visit Note: Visit Date: 09/29/2022 PCP: Kathyrn Lass, MD Referred by: Kathyrn Lass, MD  Subjective: Chief Complaint  Patient presents with   Lower Back - Pain   HPI:  Ricardo Stewart is a 77 y.o. male who comes in today at the request of Dr. Rodell Perna for planned Bilateral L4-5 Lumbar Transforaminal epidural steroid injection with fluoroscopic guidance.  The patient has failed conservative care including home exercise, medications, time and activity modification.  This injection will be diagnostic and hopefully therapeutic.  Please see requesting physician notes for further details and justification.   ROS Otherwise per HPI.  Assessment & Plan: Visit Diagnoses:    ICD-10-CM   1. Lumbar radiculopathy  M54.16 XR C-ARM NO REPORT    Epidural Steroid injection    methylPREDNISolone acetate (DEPO-MEDROL) injection 80 mg    2. Spinal stenosis of lumbar region with neurogenic claudication  M48.062 XR C-ARM NO REPORT    Epidural Steroid injection    methylPREDNISolone acetate (DEPO-MEDROL) injection 80 mg      Plan: No additional findings.   Meds & Orders:  Meds ordered this encounter  Medications   methylPREDNISolone acetate (DEPO-MEDROL) injection 80 mg    Orders Placed This Encounter  Procedures   XR C-ARM NO REPORT   Epidural Steroid injection    Follow-up: Return for visit to requesting provider as needed.   Procedures: No procedures performed  Lumbosacral Transforaminal Epidural Steroid Injection - Sub-Pedicular Approach with Fluoroscopic Guidance  [name]      Date of Birth: 05/24/1946 MRN: [id number] PCP: Kathyrn Lass, MD      Visit Date: 09/29/2022   Universal Protocol:    Date/Time: 09/29/2022  Consent Given By: the patient  Position: PRONE  Additional Comments: Vital signs were monitored before and after the procedure. Patient was prepped and draped in the usual  sterile fashion. The correct patient, procedure, and site was verified.   Injection Procedure Details:   Procedure diagnoses: Lumbar radiculopathy [M54.16]    Meds Administered:  Meds ordered this encounter  Medications   methylPREDNISolone acetate (DEPO-MEDROL) injection 80 mg    Laterality: Bilateral  Location/Site: L4  Needle:5.0 in., 22 ga.  Short bevel or Quincke spinal needle  Needle Placement: Transforaminal  Findings:    -Comments: Excellent flow of contrast along the nerve, nerve root and into the epidural space.  Procedure Details: After squaring off the end-plates to get a true AP view, the C-arm was positioned so that an oblique view of the foramen as noted above was visualized. The target area is just inferior to the "nose of the scotty dog" or sub pedicular. The soft tissues overlying this structure were infiltrated with 2-3 ml. of 1% Lidocaine without Epinephrine.  The spinal needle was inserted toward the target using a "trajectory" view along the fluoroscope beam.  Under AP and lateral visualization, the needle was advanced so it did not puncture dura and was located close the 6 O'Clock position of the pedical in AP tracterory. Biplanar projections were used to confirm position. Aspiration was confirmed to be negative for CSF and/or blood. A 1-2 ml. volume of Isovue-250 was injected and flow of contrast was noted at each level. Radiographs were obtained for documentation purposes.   After attaining the desired flow of contrast documented above, a 0.5 to 1.0 ml test dose of 0.25% Marcaine was injected into each respective transforaminal space.  The patient  was observed for 90 seconds post injection.  After no sensory deficits were reported, and normal lower extremity motor function was noted,   the above injectate was administered so that equal amounts of the injectate were placed at each foramen (level) into the transforaminal epidural space.   Additional Comments:   No complications occurred Dressing: 2 x 2 sterile gauze and Band-Aid    Post-procedure details: Patient was observed during the procedure. Post-procedure instructions were reviewed.  Patient left the clinic in stable condition.    Clinical History: MRI LUMBAR SPINE WITHOUT CONTRAST   TECHNIQUE: Multiplanar, multisequence MR imaging of the lumbar spine was performed. No intravenous contrast was administered.   COMPARISON:  MRI L Spine 02/03/18   FINDINGS: Segmentation:  Standard.   Alignment: Mild retrolisthesis of L3 on L4 and L4 on L5. Mild anterolisthesis of L5 on S1. These findings have slightly progressed compared to 2019.   Vertebrae: There are reactive degenerative endplate marrow changes at L4-L5 and L5-S1.   Conus medullaris and cauda equina: Conus extends to the L2-L3 disc space level. Conus and cauda equina appear normal.   Paraspinal and other soft tissues: T2 hyperintense lesion at the inferior pole of the left kidney is favored to represent a simple renal cyst.   Disc levels:   T10-T11: Eccentric left disc bulge. No overall spinal canal narrowing. Mild-to-moderate left facet degenerative change. Likely moderate left neural foraminal narrowing.   T11-T12: Eccentric left disc bulge. Mild spinal canal narrowing. Moderate left facet degenerative change. Severe left neural foraminal narrowing. This is likely progressed compared to 2019, but this was only imaged in the sagittal plane on prior exam.   T12-L1: Minimal disc bulge. Mild bilateral facet degenerative change. Mild spinal canal narrowing. No significant neural foraminal narrowing. Findings are unchanged from prior exam.   L1-L2: Minimal eccentric left disc bulge. Mild bilateral facet degenerative change. No spinal canal narrowing. Mild left neural foraminal narrowing. Findings are unchanged from prior exam.   L2-L3: Minimal eccentric left disc bulge. Mild overall spinal canal narrowing. Mild  bilateral facet degenerative change. Mild left neural foraminal narrowing. These findings are unchanged from prior exam.   L3-L4: Circumferential disc bulge. Moderate bilateral facet degenerative change with fluid in the facet joints. There is severe spinal canal stenosis, increased from prior exam. Moderate to severe right neural foraminal narrowing, increased from prior exam. Mild left neural foraminal narrowing slightly increased from prior exam. There is also interval increase in the degree of interspinous degenerative change with formation of a new extra-spinal bursal cysts (series 14, image 39).   L4-L5: Severe disc space loss. Severe bilateral facet degenerative change. Ligamentum flavum hypertrophy. Severe spinal canal stenosis, slightly progressed from prior exam. Moderate to severe right and severe left neural foraminal stenosis, slightly increased from prior exam.   L5-S1: Severe disc space loss. Severe bilateral facet degenerative change. Severe spinal canal stenosis. Severe left and moderate to severe right neural foraminal stenosis. Findings are likely slightly progressed from prior exam.   IMPRESSION: 1. Compared to prior exam, there is progressive degenerative change in the visualized thoracic and lumbar spine, most notably at T11-T12 and L3-L4. At T11-T12, there is now severe left sided neuroforaminal stenosis. At L3-L4 there is now severe spinal canal stenosis. 2. Progressive degenerative change is also present at L4-L5 and L5-S1 where there is now severe spinal canal stenosis (previously moderate). 3. Additional degenerative changes as above, including moderate to severe neuroforaminal stenosis at L3-L4 (right), L4-L5 (bilateral), and L5-S1 (left).  4. Interval increase in the degree of interspinous degenerative change at L3-L4 and L4-L5 with formation of a new extra-spinal bursal cysts.     Electronically Signed   By: Marin Roberts M.D.   On: 09/13/2022  06:53     Objective:  VS:  HT:    WT:   BMI:     BP:   HR: bpm  TEMP: ( )  RESP:  Physical Exam Vitals and nursing note reviewed.  Constitutional:      General: He is not in acute distress.    Appearance: Normal appearance. He is not ill-appearing.  HENT:     Head: Normocephalic and atraumatic.     Right Ear: External ear normal.     Left Ear: External ear normal.     Nose: No congestion.  Eyes:     Extraocular Movements: Extraocular movements intact.  Cardiovascular:     Rate and Rhythm: Normal rate.     Pulses: Normal pulses.  Pulmonary:     Effort: Pulmonary effort is normal. No respiratory distress.  Abdominal:     General: There is no distension.     Palpations: Abdomen is soft.  Musculoskeletal:        General: No tenderness or signs of injury.     Cervical back: Neck supple.     Right lower leg: No edema.     Left lower leg: No edema.     Comments: Patient has good distal strength without clonus.  Skin:    Findings: No erythema or rash.  Neurological:     General: No focal deficit present.     Mental Status: He is alert and oriented to person, place, and time.     Sensory: No sensory deficit.     Motor: No weakness or abnormal muscle tone.     Coordination: Coordination normal.  Psychiatric:        Mood and Affect: Mood normal.        Behavior: Behavior normal.      Imaging: No results found.

## 2022-10-12 ENCOUNTER — Ambulatory Visit (INDEPENDENT_AMBULATORY_CARE_PROVIDER_SITE_OTHER): Payer: Medicare HMO

## 2022-10-12 ENCOUNTER — Encounter: Payer: Self-pay | Admitting: Orthopedic Surgery

## 2022-10-12 ENCOUNTER — Ambulatory Visit: Payer: Medicare HMO | Admitting: Orthopedic Surgery

## 2022-10-12 VITALS — BP 161/82 | HR 57 | Ht 67.0 in | Wt 215.0 lb

## 2022-10-12 DIAGNOSIS — M5416 Radiculopathy, lumbar region: Secondary | ICD-10-CM

## 2022-10-12 DIAGNOSIS — M48062 Spinal stenosis, lumbar region with neurogenic claudication: Secondary | ICD-10-CM

## 2022-10-12 NOTE — Progress Notes (Signed)
Orthopedic Spine Surgery Office Note  Assessment: Patient is a 77 y.o. male with low back pain that radiates into his left buttock and posterior thigh. Has significant central and lateral recess stenosis from L4-S1. He also has foraminal stenosis on the left side at those levels.    Plan: -Explained that initially conservative treatment is tried as a significant number of patients may experience relief with these treatment modalities. Discussed that the conservative treatments include:  -activity modification  -physical therapy  -over the counter pain medications  -medrol dosepak  -lumbar steroid injections -Patient has tried PT, activity modification, steroid injection, NSAIDs  -He has tried conservative treatments and gets some temporary relief with them. He is interested in a longer lasting treatment option. I discussed L3-S1 laminectomy and left sided foraminotomies as an option. I discussed adding fusion too because he has lateral listhesis at L4/5 and a spondylolisthesis at L5/S1. I said this would put him at risk of adjacent segment disease. If no fusion were done though, it may cause his scoliosis to become more significant -Patient should return to office in 6 weeks, x-rays at next visit: none   Patient expressed understanding of the plan and all questions were answered to the patient's satisfaction.   ___________________________________________________________________________   History:  Patient is a 77 y.o. male who presents today for lumbar spine. Patient has had back pain for 60 years. He states that he started having issues with his back when he was 48 and playing football. Pain in his back has been tolerable over that duration of time. Then, about 1 year ago, he started to notice pain in his left buttock and posterior thigh. This pain was different than his back pain. His back pain is still present but his leg pain is more severe and, at times, forces him to decrease his  activity or stop what he is doing. He feels the pain on a daily basis. Some days it is more severe than other days. He had an L4 injection with Dr. Ernestina Patches and that gave him about 70% relief. No pain on the right side. Pain does not radiate past the knee on the left side.    Weakness: denies Symptoms of imbalance: denies Paresthesias and numbness: denies Bowel or bladder incontinence: denies Saddle anesthesia: denies  Treatments tried: PT, activity modification, steroid injection, NSAIDs  Review of systems: Denies fevers and chills, night sweats, unexplained weight loss, history of cancer. Has had pain that wakes him at night  Past medical history: HLD HTN BPH  Allergies: NKDA  Past surgical history:  Right THA Right clavicle ORIF Right radius fracture ORIF  Social history: Denies use of nicotine product (smoking, vaping, patches, smokeless) Alcohol use: yes, 1 drink per week Denies recreational drug use   Physical Exam:  General: no acute distress, appears stated age Neurologic: alert, answering questions appropriately, following commands Respiratory: unlabored breathing on room air, symmetric chest rise Psychiatric: appropriate affect, normal cadence to speech   MSK (spine):  -Strength exam      Left  Right EHL    4/5  4/5 TA    5/5  5/5 GSC    5/5  5/5 Knee extension  5/5  5/5 Hip flexion   5/5  5/5  -Sensory exam    Sensation intact to light touch in L3-S1 nerve distributions of bilateral lower extremities  -Achilles DTR: 2/4 on the left, 2/4 on the right -Patellar tendon DTR: 2/4 on the left, 2/4 on the right  -Straight leg  raise: negative -Contralateral straight leg raise: negative -Femoral nerve stretch test: negative bilaterally -Clonus: no beats bilaterally  -Left hip exam: no pain through range of motion, negative stinchfield, negative faber -Right hip exam: no pain through range of motion, negative stinchfield, negative faber  Imaging: XR  of the lumbar spine from 10/12/2022 was independently reviewed and interpreted, showing spondylolisthesis at L5/S1 - shifts about 55mm between flexion and extension views. Disc height loss at L4/5 and L5/S1. PI of 52. LL of 54. No fracture or dislocation. Lumbar degenerative scoliosis with apex to the left. Lateral listhesis at L4/5.   MRI of the lumbar spine from 09/08/2022 was independently reviewed and interpreted, showing DDD at T11/12, L4/5, and L5/S1. Modic changes at T11/12 and L4/5. Foraminal stenosis at L4/5 and L5/S1 (L>R). Central and lateral recess stenosis from L3-S1.    Patient name: Ricardo Stewart Patient MRN: [id number] Date of visit: 10/12/22

## 2022-10-25 DIAGNOSIS — I10 Essential (primary) hypertension: Secondary | ICD-10-CM | POA: Diagnosis not present

## 2022-10-25 DIAGNOSIS — H353131 Nonexudative age-related macular degeneration, bilateral, early dry stage: Secondary | ICD-10-CM | POA: Diagnosis not present

## 2022-10-25 DIAGNOSIS — H524 Presbyopia: Secondary | ICD-10-CM | POA: Diagnosis not present

## 2022-11-09 DIAGNOSIS — M4186 Other forms of scoliosis, lumbar region: Secondary | ICD-10-CM | POA: Diagnosis not present

## 2022-11-09 DIAGNOSIS — M4150 Other secondary scoliosis, site unspecified: Secondary | ICD-10-CM | POA: Diagnosis not present

## 2022-11-09 DIAGNOSIS — M47817 Spondylosis without myelopathy or radiculopathy, lumbosacral region: Secondary | ICD-10-CM | POA: Diagnosis not present

## 2022-11-09 DIAGNOSIS — M5136 Other intervertebral disc degeneration, lumbar region: Secondary | ICD-10-CM | POA: Diagnosis not present

## 2022-11-09 DIAGNOSIS — M4316 Spondylolisthesis, lumbar region: Secondary | ICD-10-CM | POA: Diagnosis not present

## 2022-11-09 DIAGNOSIS — M48061 Spinal stenosis, lumbar region without neurogenic claudication: Secondary | ICD-10-CM | POA: Diagnosis not present

## 2022-11-09 DIAGNOSIS — M4696 Unspecified inflammatory spondylopathy, lumbar region: Secondary | ICD-10-CM | POA: Diagnosis not present

## 2022-11-09 DIAGNOSIS — M4157 Other secondary scoliosis, lumbosacral region: Secondary | ICD-10-CM | POA: Diagnosis not present

## 2022-11-09 DIAGNOSIS — M5416 Radiculopathy, lumbar region: Secondary | ICD-10-CM | POA: Diagnosis not present

## 2022-11-15 DIAGNOSIS — E7800 Pure hypercholesterolemia, unspecified: Secondary | ICD-10-CM | POA: Diagnosis not present

## 2022-11-15 DIAGNOSIS — I10 Essential (primary) hypertension: Secondary | ICD-10-CM | POA: Diagnosis not present

## 2022-11-15 DIAGNOSIS — Z6836 Body mass index (BMI) 36.0-36.9, adult: Secondary | ICD-10-CM | POA: Diagnosis not present

## 2022-11-15 DIAGNOSIS — M48061 Spinal stenosis, lumbar region without neurogenic claudication: Secondary | ICD-10-CM | POA: Diagnosis not present

## 2022-11-15 DIAGNOSIS — R972 Elevated prostate specific antigen [PSA]: Secondary | ICD-10-CM | POA: Diagnosis not present

## 2022-11-21 DIAGNOSIS — I10 Essential (primary) hypertension: Secondary | ICD-10-CM | POA: Diagnosis not present

## 2022-11-21 DIAGNOSIS — Z01818 Encounter for other preprocedural examination: Secondary | ICD-10-CM | POA: Diagnosis not present

## 2022-11-24 ENCOUNTER — Ambulatory Visit: Payer: Medicare HMO | Admitting: Orthopedic Surgery

## 2022-11-24 DIAGNOSIS — I10 Essential (primary) hypertension: Secondary | ICD-10-CM | POA: Diagnosis not present

## 2022-11-24 DIAGNOSIS — M48062 Spinal stenosis, lumbar region with neurogenic claudication: Secondary | ICD-10-CM | POA: Diagnosis not present

## 2022-11-24 DIAGNOSIS — M4807 Spinal stenosis, lumbosacral region: Secondary | ICD-10-CM | POA: Diagnosis not present

## 2022-11-24 DIAGNOSIS — M5416 Radiculopathy, lumbar region: Secondary | ICD-10-CM | POA: Diagnosis not present

## 2022-11-24 DIAGNOSIS — E785 Hyperlipidemia, unspecified: Secondary | ICD-10-CM | POA: Diagnosis not present

## 2022-12-06 DIAGNOSIS — I493 Ventricular premature depolarization: Secondary | ICD-10-CM | POA: Diagnosis not present

## 2022-12-08 DIAGNOSIS — Z1389 Encounter for screening for other disorder: Secondary | ICD-10-CM | POA: Diagnosis not present

## 2022-12-08 DIAGNOSIS — Z0000 Encounter for general adult medical examination without abnormal findings: Secondary | ICD-10-CM | POA: Diagnosis not present

## 2022-12-20 DIAGNOSIS — Z9889 Other specified postprocedural states: Secondary | ICD-10-CM | POA: Diagnosis not present

## 2022-12-20 DIAGNOSIS — Z4789 Encounter for other orthopedic aftercare: Secondary | ICD-10-CM | POA: Diagnosis not present

## 2022-12-26 DIAGNOSIS — R972 Elevated prostate specific antigen [PSA]: Secondary | ICD-10-CM | POA: Diagnosis not present

## 2023-02-01 DIAGNOSIS — J019 Acute sinusitis, unspecified: Secondary | ICD-10-CM | POA: Diagnosis not present

## 2023-02-01 DIAGNOSIS — Z6838 Body mass index (BMI) 38.0-38.9, adult: Secondary | ICD-10-CM | POA: Diagnosis not present

## 2023-02-03 ENCOUNTER — Other Ambulatory Visit (HOSPITAL_COMMUNITY): Payer: Self-pay | Admitting: Urology

## 2023-02-03 DIAGNOSIS — R972 Elevated prostate specific antigen [PSA]: Secondary | ICD-10-CM | POA: Diagnosis not present

## 2023-02-03 DIAGNOSIS — R3914 Feeling of incomplete bladder emptying: Secondary | ICD-10-CM | POA: Diagnosis not present

## 2023-02-03 DIAGNOSIS — N401 Enlarged prostate with lower urinary tract symptoms: Secondary | ICD-10-CM | POA: Diagnosis not present

## 2023-02-03 DIAGNOSIS — R3121 Asymptomatic microscopic hematuria: Secondary | ICD-10-CM | POA: Diagnosis not present

## 2023-02-03 DIAGNOSIS — N5201 Erectile dysfunction due to arterial insufficiency: Secondary | ICD-10-CM | POA: Diagnosis not present

## 2023-02-07 DIAGNOSIS — Z9889 Other specified postprocedural states: Secondary | ICD-10-CM | POA: Diagnosis not present

## 2023-02-15 DIAGNOSIS — M5417 Radiculopathy, lumbosacral region: Secondary | ICD-10-CM | POA: Diagnosis not present

## 2023-02-15 DIAGNOSIS — Z9889 Other specified postprocedural states: Secondary | ICD-10-CM | POA: Diagnosis not present

## 2023-02-16 ENCOUNTER — Ambulatory Visit (HOSPITAL_COMMUNITY)
Admission: RE | Admit: 2023-02-16 | Discharge: 2023-02-16 | Disposition: A | Discharge status: Home / Self Care | Payer: Medicare HMO | Source: Ambulatory Visit | Attending: Urology | Admitting: Urology

## 2023-02-16 DIAGNOSIS — R972 Elevated prostate specific antigen [PSA]: Secondary | ICD-10-CM | POA: Diagnosis not present

## 2023-02-16 MED ORDER — GADOBUTROL 1 MMOL/ML IV SOLN
10.0000 mL | Freq: Once | INTRAVENOUS | Status: AC | PRN
  Administered 2023-02-16: 10 mL via INTRAVENOUS

## 2023-03-03 DIAGNOSIS — N289 Disorder of kidney and ureter, unspecified: Secondary | ICD-10-CM | POA: Diagnosis not present

## 2023-03-03 DIAGNOSIS — M5126 Other intervertebral disc displacement, lumbar region: Secondary | ICD-10-CM | POA: Diagnosis not present

## 2023-03-03 DIAGNOSIS — M438X6 Other specified deforming dorsopathies, lumbar region: Secondary | ICD-10-CM | POA: Diagnosis not present

## 2023-03-03 DIAGNOSIS — M5136 Other intervertebral disc degeneration, lumbar region: Secondary | ICD-10-CM | POA: Diagnosis not present

## 2023-03-03 DIAGNOSIS — M48061 Spinal stenosis, lumbar region without neurogenic claudication: Secondary | ICD-10-CM | POA: Diagnosis not present

## 2023-03-03 DIAGNOSIS — M4807 Spinal stenosis, lumbosacral region: Secondary | ICD-10-CM | POA: Diagnosis not present

## 2023-03-03 DIAGNOSIS — Z9889 Other specified postprocedural states: Secondary | ICD-10-CM | POA: Diagnosis not present

## 2023-03-03 DIAGNOSIS — M5137 Other intervertebral disc degeneration, lumbosacral region: Secondary | ICD-10-CM | POA: Diagnosis not present

## 2023-03-06 ENCOUNTER — Telehealth: Payer: Self-pay | Admitting: Orthopedic Surgery

## 2023-03-06 NOTE — Telephone Encounter (Signed)
Pt called requesting a call from Dr Christell Constant. Pt went to GI for pelvic tares. Pt states he need to discuss with Dr Christell Constant what is going on. Pt is going to sen MRI. Pt phone number is [phone].

## 2023-03-06 NOTE — Telephone Encounter (Signed)
Please advise. You have not seen patient since January.

## 2023-03-10 ENCOUNTER — Encounter: Payer: Self-pay | Admitting: Orthopedic Surgery

## 2023-03-14 NOTE — Telephone Encounter (Signed)
Patient is scheduled for 04/03/23 @ 415pm

## 2023-03-15 DIAGNOSIS — M48061 Spinal stenosis, lumbar region without neurogenic claudication: Secondary | ICD-10-CM | POA: Diagnosis not present

## 2023-03-15 DIAGNOSIS — M4150 Other secondary scoliosis, site unspecified: Secondary | ICD-10-CM | POA: Diagnosis not present

## 2023-03-15 DIAGNOSIS — M5416 Radiculopathy, lumbar region: Secondary | ICD-10-CM | POA: Diagnosis not present

## 2023-03-15 DIAGNOSIS — M47816 Spondylosis without myelopathy or radiculopathy, lumbar region: Secondary | ICD-10-CM | POA: Diagnosis not present

## 2023-03-15 DIAGNOSIS — M4310 Spondylolisthesis, site unspecified: Secondary | ICD-10-CM | POA: Diagnosis not present

## 2023-03-15 DIAGNOSIS — Z9889 Other specified postprocedural states: Secondary | ICD-10-CM | POA: Diagnosis not present

## 2023-03-29 DIAGNOSIS — D075 Carcinoma in situ of prostate: Secondary | ICD-10-CM | POA: Diagnosis not present

## 2023-03-29 DIAGNOSIS — R972 Elevated prostate specific antigen [PSA]: Secondary | ICD-10-CM | POA: Diagnosis not present

## 2023-03-29 DIAGNOSIS — N411 Chronic prostatitis: Secondary | ICD-10-CM | POA: Diagnosis not present

## 2023-04-03 ENCOUNTER — Ambulatory Visit: Payer: Medicare HMO | Admitting: Orthopedic Surgery

## 2023-05-02 DIAGNOSIS — R600 Localized edema: Secondary | ICD-10-CM | POA: Diagnosis not present

## 2023-05-02 DIAGNOSIS — I10 Essential (primary) hypertension: Secondary | ICD-10-CM | POA: Diagnosis not present

## 2023-05-02 DIAGNOSIS — Z01818 Encounter for other preprocedural examination: Secondary | ICD-10-CM | POA: Diagnosis not present

## 2023-05-02 DIAGNOSIS — E785 Hyperlipidemia, unspecified: Secondary | ICD-10-CM | POA: Diagnosis not present

## 2023-05-04 DIAGNOSIS — R600 Localized edema: Secondary | ICD-10-CM | POA: Diagnosis not present

## 2023-05-04 DIAGNOSIS — Z6841 Body Mass Index (BMI) 40.0 and over, adult: Secondary | ICD-10-CM | POA: Diagnosis not present

## 2023-05-09 DIAGNOSIS — Z9889 Other specified postprocedural states: Secondary | ICD-10-CM | POA: Diagnosis not present

## 2023-05-09 DIAGNOSIS — M4726 Other spondylosis with radiculopathy, lumbar region: Secondary | ICD-10-CM | POA: Diagnosis not present

## 2023-05-09 DIAGNOSIS — I2510 Atherosclerotic heart disease of native coronary artery without angina pectoris: Secondary | ICD-10-CM | POA: Diagnosis not present

## 2023-05-09 DIAGNOSIS — M48062 Spinal stenosis, lumbar region with neurogenic claudication: Secondary | ICD-10-CM | POA: Diagnosis not present

## 2023-05-09 DIAGNOSIS — M47816 Spondylosis without myelopathy or radiculopathy, lumbar region: Secondary | ICD-10-CM | POA: Diagnosis not present

## 2023-05-09 DIAGNOSIS — M48061 Spinal stenosis, lumbar region without neurogenic claudication: Secondary | ICD-10-CM | POA: Diagnosis not present

## 2023-05-09 DIAGNOSIS — M4186 Other forms of scoliosis, lumbar region: Secondary | ICD-10-CM | POA: Diagnosis not present

## 2023-05-09 DIAGNOSIS — M4156 Other secondary scoliosis, lumbar region: Secondary | ICD-10-CM | POA: Diagnosis not present

## 2023-05-09 DIAGNOSIS — M4316 Spondylolisthesis, lumbar region: Secondary | ICD-10-CM | POA: Diagnosis not present

## 2023-05-09 DIAGNOSIS — E669 Obesity, unspecified: Secondary | ICD-10-CM | POA: Diagnosis not present

## 2023-05-09 DIAGNOSIS — I272 Pulmonary hypertension, unspecified: Secondary | ICD-10-CM | POA: Diagnosis not present

## 2023-05-09 DIAGNOSIS — M4807 Spinal stenosis, lumbosacral region: Secondary | ICD-10-CM | POA: Diagnosis not present

## 2023-05-09 DIAGNOSIS — G8929 Other chronic pain: Secondary | ICD-10-CM | POA: Diagnosis not present

## 2023-05-09 DIAGNOSIS — M4317 Spondylolisthesis, lumbosacral region: Secondary | ICD-10-CM | POA: Diagnosis not present

## 2023-05-09 DIAGNOSIS — Z6838 Body mass index (BMI) 38.0-38.9, adult: Secondary | ICD-10-CM | POA: Diagnosis not present

## 2023-05-09 DIAGNOSIS — N400 Enlarged prostate without lower urinary tract symptoms: Secondary | ICD-10-CM | POA: Diagnosis not present

## 2023-05-25 DIAGNOSIS — M5441 Lumbago with sciatica, right side: Secondary | ICD-10-CM | POA: Diagnosis not present

## 2023-05-25 DIAGNOSIS — M4310 Spondylolisthesis, site unspecified: Secondary | ICD-10-CM | POA: Diagnosis not present

## 2023-05-25 DIAGNOSIS — Z4789 Encounter for other orthopedic aftercare: Secondary | ICD-10-CM | POA: Diagnosis not present

## 2023-05-25 DIAGNOSIS — G8929 Other chronic pain: Secondary | ICD-10-CM | POA: Diagnosis not present

## 2023-05-25 DIAGNOSIS — M419 Scoliosis, unspecified: Secondary | ICD-10-CM | POA: Diagnosis not present

## 2023-05-25 DIAGNOSIS — M48062 Spinal stenosis, lumbar region with neurogenic claudication: Secondary | ICD-10-CM | POA: Diagnosis not present

## 2023-05-25 DIAGNOSIS — M5442 Lumbago with sciatica, left side: Secondary | ICD-10-CM | POA: Diagnosis not present

## 2023-05-25 DIAGNOSIS — M4726 Other spondylosis with radiculopathy, lumbar region: Secondary | ICD-10-CM | POA: Diagnosis not present

## 2023-05-25 DIAGNOSIS — M4808 Spinal stenosis, sacral and sacrococcygeal region: Secondary | ICD-10-CM | POA: Diagnosis not present

## 2023-05-29 DIAGNOSIS — M4808 Spinal stenosis, sacral and sacrococcygeal region: Secondary | ICD-10-CM | POA: Diagnosis not present

## 2023-05-29 DIAGNOSIS — G8929 Other chronic pain: Secondary | ICD-10-CM | POA: Diagnosis not present

## 2023-05-29 DIAGNOSIS — M48062 Spinal stenosis, lumbar region with neurogenic claudication: Secondary | ICD-10-CM | POA: Diagnosis not present

## 2023-05-29 DIAGNOSIS — M419 Scoliosis, unspecified: Secondary | ICD-10-CM | POA: Diagnosis not present

## 2023-05-29 DIAGNOSIS — M4310 Spondylolisthesis, site unspecified: Secondary | ICD-10-CM | POA: Diagnosis not present

## 2023-05-29 DIAGNOSIS — Z4789 Encounter for other orthopedic aftercare: Secondary | ICD-10-CM | POA: Diagnosis not present

## 2023-05-29 DIAGNOSIS — M5441 Lumbago with sciatica, right side: Secondary | ICD-10-CM | POA: Diagnosis not present

## 2023-05-29 DIAGNOSIS — M4726 Other spondylosis with radiculopathy, lumbar region: Secondary | ICD-10-CM | POA: Diagnosis not present

## 2023-05-29 DIAGNOSIS — M5442 Lumbago with sciatica, left side: Secondary | ICD-10-CM | POA: Diagnosis not present

## 2023-05-30 DIAGNOSIS — M48062 Spinal stenosis, lumbar region with neurogenic claudication: Secondary | ICD-10-CM | POA: Diagnosis not present

## 2023-05-30 DIAGNOSIS — G8929 Other chronic pain: Secondary | ICD-10-CM | POA: Diagnosis not present

## 2023-05-30 DIAGNOSIS — M5442 Lumbago with sciatica, left side: Secondary | ICD-10-CM | POA: Diagnosis not present

## 2023-05-30 DIAGNOSIS — M419 Scoliosis, unspecified: Secondary | ICD-10-CM | POA: Diagnosis not present

## 2023-05-30 DIAGNOSIS — M5441 Lumbago with sciatica, right side: Secondary | ICD-10-CM | POA: Diagnosis not present

## 2023-05-30 DIAGNOSIS — M4726 Other spondylosis with radiculopathy, lumbar region: Secondary | ICD-10-CM | POA: Diagnosis not present

## 2023-05-30 DIAGNOSIS — Z4789 Encounter for other orthopedic aftercare: Secondary | ICD-10-CM | POA: Diagnosis not present

## 2023-05-30 DIAGNOSIS — M4808 Spinal stenosis, sacral and sacrococcygeal region: Secondary | ICD-10-CM | POA: Diagnosis not present

## 2023-05-30 DIAGNOSIS — M4310 Spondylolisthesis, site unspecified: Secondary | ICD-10-CM | POA: Diagnosis not present

## 2023-06-01 DIAGNOSIS — M419 Scoliosis, unspecified: Secondary | ICD-10-CM | POA: Diagnosis not present

## 2023-06-01 DIAGNOSIS — M4726 Other spondylosis with radiculopathy, lumbar region: Secondary | ICD-10-CM | POA: Diagnosis not present

## 2023-06-01 DIAGNOSIS — G8929 Other chronic pain: Secondary | ICD-10-CM | POA: Diagnosis not present

## 2023-06-01 DIAGNOSIS — M48062 Spinal stenosis, lumbar region with neurogenic claudication: Secondary | ICD-10-CM | POA: Diagnosis not present

## 2023-06-01 DIAGNOSIS — M5441 Lumbago with sciatica, right side: Secondary | ICD-10-CM | POA: Diagnosis not present

## 2023-06-01 DIAGNOSIS — Z4789 Encounter for other orthopedic aftercare: Secondary | ICD-10-CM | POA: Diagnosis not present

## 2023-06-01 DIAGNOSIS — M4808 Spinal stenosis, sacral and sacrococcygeal region: Secondary | ICD-10-CM | POA: Diagnosis not present

## 2023-06-01 DIAGNOSIS — M5442 Lumbago with sciatica, left side: Secondary | ICD-10-CM | POA: Diagnosis not present

## 2023-06-01 DIAGNOSIS — M4310 Spondylolisthesis, site unspecified: Secondary | ICD-10-CM | POA: Diagnosis not present

## 2023-06-06 DIAGNOSIS — M48062 Spinal stenosis, lumbar region with neurogenic claudication: Secondary | ICD-10-CM | POA: Diagnosis not present

## 2023-06-06 DIAGNOSIS — M4310 Spondylolisthesis, site unspecified: Secondary | ICD-10-CM | POA: Diagnosis not present

## 2023-06-06 DIAGNOSIS — Z981 Arthrodesis status: Secondary | ICD-10-CM | POA: Diagnosis not present

## 2023-06-07 DIAGNOSIS — G8929 Other chronic pain: Secondary | ICD-10-CM | POA: Diagnosis not present

## 2023-06-07 DIAGNOSIS — M5441 Lumbago with sciatica, right side: Secondary | ICD-10-CM | POA: Diagnosis not present

## 2023-06-07 DIAGNOSIS — M5442 Lumbago with sciatica, left side: Secondary | ICD-10-CM | POA: Diagnosis not present

## 2023-06-07 DIAGNOSIS — Z4789 Encounter for other orthopedic aftercare: Secondary | ICD-10-CM | POA: Diagnosis not present

## 2023-06-07 DIAGNOSIS — M4808 Spinal stenosis, sacral and sacrococcygeal region: Secondary | ICD-10-CM | POA: Diagnosis not present

## 2023-06-07 DIAGNOSIS — M48062 Spinal stenosis, lumbar region with neurogenic claudication: Secondary | ICD-10-CM | POA: Diagnosis not present

## 2023-06-07 DIAGNOSIS — M4310 Spondylolisthesis, site unspecified: Secondary | ICD-10-CM | POA: Diagnosis not present

## 2023-06-07 DIAGNOSIS — M419 Scoliosis, unspecified: Secondary | ICD-10-CM | POA: Diagnosis not present

## 2023-06-07 DIAGNOSIS — M4726 Other spondylosis with radiculopathy, lumbar region: Secondary | ICD-10-CM | POA: Diagnosis not present

## 2023-06-14 DIAGNOSIS — G8929 Other chronic pain: Secondary | ICD-10-CM | POA: Diagnosis not present

## 2023-06-14 DIAGNOSIS — M4310 Spondylolisthesis, site unspecified: Secondary | ICD-10-CM | POA: Diagnosis not present

## 2023-06-14 DIAGNOSIS — M48062 Spinal stenosis, lumbar region with neurogenic claudication: Secondary | ICD-10-CM | POA: Diagnosis not present

## 2023-06-14 DIAGNOSIS — Z4789 Encounter for other orthopedic aftercare: Secondary | ICD-10-CM | POA: Diagnosis not present

## 2023-06-14 DIAGNOSIS — M5442 Lumbago with sciatica, left side: Secondary | ICD-10-CM | POA: Diagnosis not present

## 2023-06-14 DIAGNOSIS — M4808 Spinal stenosis, sacral and sacrococcygeal region: Secondary | ICD-10-CM | POA: Diagnosis not present

## 2023-06-14 DIAGNOSIS — M5441 Lumbago with sciatica, right side: Secondary | ICD-10-CM | POA: Diagnosis not present

## 2023-06-14 DIAGNOSIS — M4726 Other spondylosis with radiculopathy, lumbar region: Secondary | ICD-10-CM | POA: Diagnosis not present

## 2023-06-14 DIAGNOSIS — M419 Scoliosis, unspecified: Secondary | ICD-10-CM | POA: Diagnosis not present

## 2023-06-16 DIAGNOSIS — M4808 Spinal stenosis, sacral and sacrococcygeal region: Secondary | ICD-10-CM | POA: Diagnosis not present

## 2023-06-16 DIAGNOSIS — M4310 Spondylolisthesis, site unspecified: Secondary | ICD-10-CM | POA: Diagnosis not present

## 2023-06-16 DIAGNOSIS — M4726 Other spondylosis with radiculopathy, lumbar region: Secondary | ICD-10-CM | POA: Diagnosis not present

## 2023-06-16 DIAGNOSIS — M5441 Lumbago with sciatica, right side: Secondary | ICD-10-CM | POA: Diagnosis not present

## 2023-06-16 DIAGNOSIS — M419 Scoliosis, unspecified: Secondary | ICD-10-CM | POA: Diagnosis not present

## 2023-06-16 DIAGNOSIS — Z4789 Encounter for other orthopedic aftercare: Secondary | ICD-10-CM | POA: Diagnosis not present

## 2023-06-16 DIAGNOSIS — M5442 Lumbago with sciatica, left side: Secondary | ICD-10-CM | POA: Diagnosis not present

## 2023-06-16 DIAGNOSIS — G8929 Other chronic pain: Secondary | ICD-10-CM | POA: Diagnosis not present

## 2023-06-16 DIAGNOSIS — M48062 Spinal stenosis, lumbar region with neurogenic claudication: Secondary | ICD-10-CM | POA: Diagnosis not present

## 2023-06-21 DIAGNOSIS — M4726 Other spondylosis with radiculopathy, lumbar region: Secondary | ICD-10-CM | POA: Diagnosis not present

## 2023-06-21 DIAGNOSIS — M4808 Spinal stenosis, sacral and sacrococcygeal region: Secondary | ICD-10-CM | POA: Diagnosis not present

## 2023-06-21 DIAGNOSIS — M5441 Lumbago with sciatica, right side: Secondary | ICD-10-CM | POA: Diagnosis not present

## 2023-06-21 DIAGNOSIS — M4310 Spondylolisthesis, site unspecified: Secondary | ICD-10-CM | POA: Diagnosis not present

## 2023-06-21 DIAGNOSIS — G8929 Other chronic pain: Secondary | ICD-10-CM | POA: Diagnosis not present

## 2023-06-21 DIAGNOSIS — M419 Scoliosis, unspecified: Secondary | ICD-10-CM | POA: Diagnosis not present

## 2023-06-21 DIAGNOSIS — M48062 Spinal stenosis, lumbar region with neurogenic claudication: Secondary | ICD-10-CM | POA: Diagnosis not present

## 2023-06-21 DIAGNOSIS — Z4789 Encounter for other orthopedic aftercare: Secondary | ICD-10-CM | POA: Diagnosis not present

## 2023-06-21 DIAGNOSIS — M5442 Lumbago with sciatica, left side: Secondary | ICD-10-CM | POA: Diagnosis not present

## 2023-06-27 DIAGNOSIS — Z981 Arthrodesis status: Secondary | ICD-10-CM | POA: Diagnosis not present

## 2023-06-27 DIAGNOSIS — G8929 Other chronic pain: Secondary | ICD-10-CM | POA: Diagnosis not present

## 2023-06-27 DIAGNOSIS — M5442 Lumbago with sciatica, left side: Secondary | ICD-10-CM | POA: Diagnosis not present

## 2023-07-06 DIAGNOSIS — M545 Low back pain, unspecified: Secondary | ICD-10-CM | POA: Diagnosis not present

## 2023-07-06 DIAGNOSIS — Z981 Arthrodesis status: Secondary | ICD-10-CM | POA: Diagnosis not present

## 2023-07-06 DIAGNOSIS — G8929 Other chronic pain: Secondary | ICD-10-CM | POA: Diagnosis not present

## 2023-07-13 DIAGNOSIS — M5441 Lumbago with sciatica, right side: Secondary | ICD-10-CM | POA: Diagnosis not present

## 2023-07-13 DIAGNOSIS — Z981 Arthrodesis status: Secondary | ICD-10-CM | POA: Diagnosis not present

## 2023-07-13 DIAGNOSIS — M5442 Lumbago with sciatica, left side: Secondary | ICD-10-CM | POA: Diagnosis not present

## 2023-07-13 DIAGNOSIS — G8929 Other chronic pain: Secondary | ICD-10-CM | POA: Diagnosis not present

## 2023-07-18 DIAGNOSIS — G8929 Other chronic pain: Secondary | ICD-10-CM | POA: Diagnosis not present

## 2023-07-18 DIAGNOSIS — Z981 Arthrodesis status: Secondary | ICD-10-CM | POA: Diagnosis not present

## 2023-07-18 DIAGNOSIS — M5442 Lumbago with sciatica, left side: Secondary | ICD-10-CM | POA: Diagnosis not present

## 2023-07-18 DIAGNOSIS — M5441 Lumbago with sciatica, right side: Secondary | ICD-10-CM | POA: Diagnosis not present

## 2023-07-27 DIAGNOSIS — M545 Low back pain, unspecified: Secondary | ICD-10-CM | POA: Diagnosis not present

## 2023-07-27 DIAGNOSIS — G8929 Other chronic pain: Secondary | ICD-10-CM | POA: Diagnosis not present

## 2023-07-27 DIAGNOSIS — Z981 Arthrodesis status: Secondary | ICD-10-CM | POA: Diagnosis not present

## 2023-08-02 DIAGNOSIS — M479 Spondylosis, unspecified: Secondary | ICD-10-CM | POA: Diagnosis not present

## 2023-08-02 DIAGNOSIS — M48062 Spinal stenosis, lumbar region with neurogenic claudication: Secondary | ICD-10-CM | POA: Diagnosis not present

## 2023-08-02 DIAGNOSIS — M8588 Other specified disorders of bone density and structure, other site: Secondary | ICD-10-CM | POA: Diagnosis not present

## 2023-08-02 DIAGNOSIS — M4185 Other forms of scoliosis, thoracolumbar region: Secondary | ICD-10-CM | POA: Diagnosis not present

## 2023-08-02 DIAGNOSIS — Z981 Arthrodesis status: Secondary | ICD-10-CM | POA: Diagnosis not present

## 2023-08-02 DIAGNOSIS — M4317 Spondylolisthesis, lumbosacral region: Secondary | ICD-10-CM | POA: Diagnosis not present

## 2023-08-02 DIAGNOSIS — Z96641 Presence of right artificial hip joint: Secondary | ICD-10-CM | POA: Diagnosis not present

## 2023-08-03 DIAGNOSIS — Z981 Arthrodesis status: Secondary | ICD-10-CM | POA: Diagnosis not present

## 2023-08-03 DIAGNOSIS — G8929 Other chronic pain: Secondary | ICD-10-CM | POA: Diagnosis not present

## 2023-08-03 DIAGNOSIS — M545 Low back pain, unspecified: Secondary | ICD-10-CM | POA: Diagnosis not present

## 2023-08-11 DIAGNOSIS — G8929 Other chronic pain: Secondary | ICD-10-CM | POA: Diagnosis not present

## 2023-08-11 DIAGNOSIS — M545 Low back pain, unspecified: Secondary | ICD-10-CM | POA: Diagnosis not present

## 2023-08-11 DIAGNOSIS — Z981 Arthrodesis status: Secondary | ICD-10-CM | POA: Diagnosis not present

## 2023-09-04 DIAGNOSIS — I10 Essential (primary) hypertension: Secondary | ICD-10-CM | POA: Diagnosis not present

## 2023-09-04 DIAGNOSIS — Z6838 Body mass index (BMI) 38.0-38.9, adult: Secondary | ICD-10-CM | POA: Diagnosis not present

## 2023-09-04 DIAGNOSIS — D649 Anemia, unspecified: Secondary | ICD-10-CM | POA: Diagnosis not present

## 2023-09-04 DIAGNOSIS — D696 Thrombocytopenia, unspecified: Secondary | ICD-10-CM | POA: Diagnosis not present

## 2023-09-04 DIAGNOSIS — E7800 Pure hypercholesterolemia, unspecified: Secondary | ICD-10-CM | POA: Diagnosis not present

## 2023-10-03 DIAGNOSIS — R972 Elevated prostate specific antigen [PSA]: Secondary | ICD-10-CM | POA: Diagnosis not present

## 2023-10-11 DIAGNOSIS — R972 Elevated prostate specific antigen [PSA]: Secondary | ICD-10-CM | POA: Diagnosis not present

## 2023-10-11 DIAGNOSIS — N401 Enlarged prostate with lower urinary tract symptoms: Secondary | ICD-10-CM | POA: Diagnosis not present

## 2023-10-11 DIAGNOSIS — N5201 Erectile dysfunction due to arterial insufficiency: Secondary | ICD-10-CM | POA: Diagnosis not present

## 2023-10-11 DIAGNOSIS — R3911 Hesitancy of micturition: Secondary | ICD-10-CM | POA: Diagnosis not present

## 2023-12-11 DIAGNOSIS — Z0000 Encounter for general adult medical examination without abnormal findings: Secondary | ICD-10-CM | POA: Diagnosis not present

## 2023-12-11 DIAGNOSIS — Z1331 Encounter for screening for depression: Secondary | ICD-10-CM | POA: Diagnosis not present

## 2023-12-11 DIAGNOSIS — Z6839 Body mass index (BMI) 39.0-39.9, adult: Secondary | ICD-10-CM | POA: Diagnosis not present

## 2023-12-14 DIAGNOSIS — H2513 Age-related nuclear cataract, bilateral: Secondary | ICD-10-CM | POA: Diagnosis not present

## 2023-12-14 DIAGNOSIS — H353121 Nonexudative age-related macular degeneration, left eye, early dry stage: Secondary | ICD-10-CM | POA: Diagnosis not present

## 2023-12-14 DIAGNOSIS — Z135 Encounter for screening for eye and ear disorders: Secondary | ICD-10-CM | POA: Diagnosis not present

## 2023-12-14 DIAGNOSIS — H524 Presbyopia: Secondary | ICD-10-CM | POA: Diagnosis not present

## 2024-03-07 DIAGNOSIS — M7742 Metatarsalgia, left foot: Secondary | ICD-10-CM | POA: Diagnosis not present

## 2024-03-07 DIAGNOSIS — I10 Essential (primary) hypertension: Secondary | ICD-10-CM | POA: Diagnosis not present

## 2024-03-07 DIAGNOSIS — E7800 Pure hypercholesterolemia, unspecified: Secondary | ICD-10-CM | POA: Diagnosis not present

## 2024-03-07 DIAGNOSIS — Z6836 Body mass index (BMI) 36.0-36.9, adult: Secondary | ICD-10-CM | POA: Diagnosis not present

## 2024-03-07 DIAGNOSIS — M7741 Metatarsalgia, right foot: Secondary | ICD-10-CM | POA: Diagnosis not present

## 2024-04-09 DIAGNOSIS — N5201 Erectile dysfunction due to arterial insufficiency: Secondary | ICD-10-CM | POA: Diagnosis not present

## 2024-04-09 DIAGNOSIS — R3914 Feeling of incomplete bladder emptying: Secondary | ICD-10-CM | POA: Diagnosis not present

## 2024-04-09 DIAGNOSIS — N401 Enlarged prostate with lower urinary tract symptoms: Secondary | ICD-10-CM | POA: Diagnosis not present

## 2024-04-09 DIAGNOSIS — R972 Elevated prostate specific antigen [PSA]: Secondary | ICD-10-CM | POA: Diagnosis not present

## 2024-04-30 DIAGNOSIS — M4317 Spondylolisthesis, lumbosacral region: Secondary | ICD-10-CM | POA: Diagnosis not present

## 2024-04-30 DIAGNOSIS — M545 Low back pain, unspecified: Secondary | ICD-10-CM | POA: Diagnosis not present

## 2024-04-30 DIAGNOSIS — M419 Scoliosis, unspecified: Secondary | ICD-10-CM | POA: Diagnosis not present

## 2024-04-30 DIAGNOSIS — Z981 Arthrodesis status: Secondary | ICD-10-CM | POA: Diagnosis not present

## 2024-04-30 DIAGNOSIS — M8588 Other specified disorders of bone density and structure, other site: Secondary | ICD-10-CM | POA: Diagnosis not present

## 2024-04-30 DIAGNOSIS — M47816 Spondylosis without myelopathy or radiculopathy, lumbar region: Secondary | ICD-10-CM | POA: Diagnosis not present

## 2024-05-22 DIAGNOSIS — Z1211 Encounter for screening for malignant neoplasm of colon: Secondary | ICD-10-CM | POA: Diagnosis not present

## 2024-07-17 DIAGNOSIS — K5730 Diverticulosis of large intestine without perforation or abscess without bleeding: Secondary | ICD-10-CM | POA: Diagnosis not present

## 2024-07-17 DIAGNOSIS — D123 Benign neoplasm of transverse colon: Secondary | ICD-10-CM | POA: Diagnosis not present

## 2024-07-17 DIAGNOSIS — Z1211 Encounter for screening for malignant neoplasm of colon: Secondary | ICD-10-CM | POA: Diagnosis not present

## 2024-07-17 DIAGNOSIS — K621 Rectal polyp: Secondary | ICD-10-CM | POA: Diagnosis not present

## 2024-07-19 DIAGNOSIS — D123 Benign neoplasm of transverse colon: Secondary | ICD-10-CM | POA: Diagnosis not present

## 2024-07-19 DIAGNOSIS — K621 Rectal polyp: Secondary | ICD-10-CM | POA: Diagnosis not present

## 2024-07-24 DIAGNOSIS — Z6838 Body mass index (BMI) 38.0-38.9, adult: Secondary | ICD-10-CM | POA: Diagnosis not present

## 2024-07-24 DIAGNOSIS — R001 Bradycardia, unspecified: Secondary | ICD-10-CM | POA: Diagnosis not present

## 2024-07-24 DIAGNOSIS — I10 Essential (primary) hypertension: Secondary | ICD-10-CM | POA: Diagnosis not present

## 2024-07-24 DIAGNOSIS — I4891 Unspecified atrial fibrillation: Secondary | ICD-10-CM | POA: Diagnosis not present

## 2024-08-12 DIAGNOSIS — R001 Bradycardia, unspecified: Secondary | ICD-10-CM | POA: Diagnosis not present

## 2024-08-13 DIAGNOSIS — R972 Elevated prostate specific antigen [PSA]: Secondary | ICD-10-CM | POA: Diagnosis not present

## 2024-08-13 DIAGNOSIS — Z6837 Body mass index (BMI) 37.0-37.9, adult: Secondary | ICD-10-CM | POA: Diagnosis not present

## 2024-08-13 DIAGNOSIS — R001 Bradycardia, unspecified: Secondary | ICD-10-CM | POA: Diagnosis not present

## 2024-08-13 DIAGNOSIS — I4891 Unspecified atrial fibrillation: Secondary | ICD-10-CM | POA: Diagnosis not present

## 2024-08-13 DIAGNOSIS — E7800 Pure hypercholesterolemia, unspecified: Secondary | ICD-10-CM | POA: Diagnosis not present

## 2024-08-26 ENCOUNTER — Encounter: Payer: Self-pay | Admitting: Cardiology

## 2024-08-26 ENCOUNTER — Ambulatory Visit: Attending: Cardiology | Admitting: Cardiology

## 2024-08-26 VITALS — BP 124/68 | HR 60 | Ht 67.0 in | Wt 228.0 lb

## 2024-08-26 DIAGNOSIS — I495 Sick sinus syndrome: Secondary | ICD-10-CM

## 2024-08-26 DIAGNOSIS — I4819 Other persistent atrial fibrillation: Secondary | ICD-10-CM | POA: Diagnosis not present

## 2024-08-26 DIAGNOSIS — D6869 Other thrombophilia: Secondary | ICD-10-CM

## 2024-08-26 NOTE — Progress Notes (Signed)
 Electrophysiology Office Note:   Date:  08/26/2024  ID:  Ricardo Stewart, DOB Feb 22, 1946, MRN [id number]  Primary Cardiologist: None Primary Heart Failure: None Electrophysiologist: None      History of Present Illness:   Ricardo Stewart is a 78 y.o. male with h/o hypertension, obesity, hyperlipidemia, atrial fibrillation seen today for  for Electrophysiology evaluation of atrial fibrillation, tachybradycardia syndrome at the request of Ricardo Stewart.    He wore a recent cardiac monitor that showed a 100% atrial fibrillation burden.  He had multiple pauses on the monitor, longest 5.1 seconds.  Average heart rate on the monitor was 49 bpm.  Previously presented to his primary physician with a history of rated cardia.  He has heart rates in the 50s, but they do get as low as 35 bpm.  He has in the past attributed this to being athletic.  He has not had syncope, dizziness.  He did have an episode of unsteadiness when he stood up quickly.  Discussed the use of AI scribe software for clinical note transcription with the patient, who gave verbal consent to proceed.  History of Present Illness Ricardo Stewart is a 78 year old male with atrial fibrillation who presents with fatigue. He was referred by an anesthesiologist who discovered an irregular heartbeat during a routine colonoscopy.  His atrial fibrillation was discovered on July 17, 2024, during a routine colonoscopy when an irregular heartbeat was noted. He followed up with his primary doctor, who confirmed atrial fibrillation with a monitor showing AFib 100% of the time. An electrocardiogram confirmed ongoing atrial fibrillation. An echocardiogram was performed, but the results are not yet available.  He experiences fatigue, which he attributes to poor sleep, but acknowledges it may be related to his atrial fibrillation. He does not recall when the fatigue began but notes feeling okay over the summer. He maintains a routine of walking two to three miles  daily, although he did not walk on the day of the visit due to accompanying his wife to the hospital. No other symptoms related to atrial fibrillation.  He has a history of hypertension, for which he has been treated for many years.  His wife is currently hospitalized due to a bleeding complication following an ablation for PVCs. He plans to return to the hospital after his appointment to check on her.    Review of systems complete and found to be negative unless listed in HPI.   EP Information / Studies Reviewed:    EKG is ordered today. Personal review as below.  EKG Interpretation Date/Time:  Monday August 26 2024 15:58:16 EST Ventricular Rate:  60 PR Interval:    QRS Duration:  84 QT Interval:  408 QTC Calculation: 408 R Axis:   91  Text Interpretation: Atrial fibrillation Rightward axis Low voltage QRS When compared with ECG of 16-Jul-2019 08:57, Atrial fibrillation has replaced Sinus rhythm Confirmed by Ricardo Stewart ([id number]) on 08/26/2024 4:03:49 PM   Risk Assessment/Calculations:    CHA2DS2-VASc Score = 3   This indicates a 3.2% annual risk of stroke. The patient's score is based upon: CHF History: 0 HTN History: 1 Diabetes History: 0 Stroke History: 0 Vascular Disease History: 0 Age Score: 2 Gender Score: 0            Physical Exam:   VS:  BP 124/68 (BP Location: Right Arm, Patient Position: Sitting, Cuff Size: Large)   Pulse 60   Ht 5' 7 (1.702 m)   Wt 228 lb (103.4 kg)  SpO2 98%   BMI 35.71 kg/m    Wt Readings from Last 3 Encounters:  08/26/24 228 lb (103.4 kg)  10/12/22 215 lb (97.5 kg)  09/14/22 215 lb (97.5 kg)     GEN: Well nourished, well developed in no acute distress NECK: No JVD; No carotid bruits CARDIAC: Irregularly irregular rate and rhythm, no murmurs, rubs, gallops RESPIRATORY:  Clear to auscultation without rales, wheezing or rhonchi  ABDOMEN: Soft, non-tender, non-distended EXTREMITIES:  No edema; No deformity   ASSESSMENT  AND PLAN:    1.  Persistent atrial fibrillation: 100% burden on Zio patch, personally reviewed.  On no rate controlling medications.  For rhythm control as he is symptomatic in atrial fibrillation.  He apparently had an echo done at his primary physician's office.  Ricardo Stewart call that office to obtain the results of the echo.  If his ejection fraction is normal, we Ricardo Stewart start propafenone 225 mg twice daily and plan for cardioversion.  We did discuss ablation.  He would be amenable to ablation if cardioversion does not control his arrhythmia.  2.  Tachybradycardia syndrome: Has episodic pauses of up to 5.1 seconds.  Patient is minimally symptomatic.  3.  Obesity: Lifestyle modification encouraged  4.  Hypertension: Well-controlled  Follow up with EP Team as usual post procedure  Signed, Rion Catala Gladis Norton, MD

## 2024-09-25 ENCOUNTER — Telehealth: Payer: Self-pay | Admitting: Cardiology

## 2024-09-25 ENCOUNTER — Encounter: Payer: Self-pay | Admitting: Cardiology

## 2024-09-25 NOTE — Telephone Encounter (Signed)
 See previous MyChart message encounter. Waiting for response from Dr. Inocencio.

## 2024-09-25 NOTE — Telephone Encounter (Signed)
 Patient called to follow-up with Dr. Inocencio on next steps.  Patient noted he also sent a MyChart message dated 1/14.

## 2024-09-27 MED ORDER — PROPAFENONE HCL ER 225 MG PO CP12: 225.0000 mg | ORAL_CAPSULE | Freq: Two times a day (BID) | ORAL | 3 refills | Status: AC

## 2024-10-03 NOTE — Progress Notes (Signed)
 "  Primary Care Physician: Cleotilde Planas, MD Primary Cardiologist: None Electrophysiologist: None  Referring Physician: Inocencio Soyla Lunger, MD   Ricardo Stewart is a 79 y.o. male with a history of persistent AF(on Eliquis), tachybradycardia syndrome, HTN, obesity, who presents for follow up in the [facility].  The patient was initially diagnosed with atrial fibrillation during her colonoscopy when heart rates were fluctuating.  He was evaluated by his PCP Dr. Cleotilde with EKG revealing atrial fibrillation and patient was started on Eliquis with 2-week ZIO ordered. He also had a 2D echo ordered by his PCP at that time.  Monitor results showed 100% AF burden and patient was referred and evaluated by Dr. Inocencio on 08/26/2024.  He reported experiencing fatigue which could possibly be related to either A-fib or poor sleep.  He was started on propafenone  225 mg twice daily with plan for cardioversion. He also discussed ablation with plan to pursue if cardioversion does not control his atrial fibrillation.  2D echo results show stable EF of 55% with mild biatrial enlargement and mildly reduced RV function with mild aortic valve calcification with mild MR. Patient presents today for follow up for atrial fibrillation.   Ricardo Stewart presents today following initiation of propafenone .  On examination today patient is still in atrial fibrillation with a plan to pursue DCCV at this time.  He reports no missed doses of his Eliquis since his previous follow-up and notes that his only adverse reaction to propafenone  was loose stools which have improved. He has a history of sleep apnea, which he suspects has been present for some time. He attended a sleep clinic recently and is awaiting further monitoring. He attributes some of his fatigue to poor sleep quality, exacerbated by his wife's arthritis and a cat that disturbs his sleep. He rarely gets three hours of continuous sleep.He maintains a routine  of walking two to three miles daily, a practice he has kept for 45 years, although he has felt more fatigued since September or October. He has a family history of sleep apnea, as his son was diagnosed and managed it with weight loss. He is considering weight loss as a strategy to manage his sleep apnea and atrial fibrillation.He has been using ibuprofen for pain management but has moderated its use due to potential interactions with his blood thinner, Eliquis. He has a history of back pain and has used gabapentin  for nerve pain management.Today, he denies symptoms of palpitations, chest pain, shortness of breath, orthopnea, PND, lower extremity edema, dizziness, presyncope, syncope, snoring, daytime somnolence, bleeding, or neurologic sequela. The patient is tolerating medications without difficulties and is otherwise without complaint today.   Discussed the use of AI scribe software for clinical note transcription with the patient, who gave verbal consent to proceed.   Atrial Fibrillation Management history: History of Sleep Apnea currently being evaluated Very low alcohol use Previous antiarrhythmic drugs: Propafenone  Previous cardioversions: None Previous ablations: None Anticoagulation history: Eliquis  ROS- All systems are reviewed and negative except as per the HPI above.  Past Medical History:  Diagnosis Date   Arthritis    hips and spine   Hypertension    Peripheral neuropathy    bilat   Past Surgical History:  Procedure Laterality Date   FRACTURE SURGERY Right    radius and collar bone   TOTAL HIP ARTHROPLASTY Right 07/19/2019   Procedure: RIGHT TOTAL HIP ARTHROPLASTY ANTERIOR APPROACH;  Surgeon: Vernetta Lonni GRADE, MD;  Location: [facility];  Service:  Orthopedics;  Laterality: Right;   Patient has no known allergies. Current Outpatient Medications  Medication Sig Dispense Refill   acetaminophen  (TYLENOL ) 325 MG tablet Take 325 mg by mouth every 6 (six) hours as needed for  mild pain (pain score 1-3) or moderate pain (pain score 4-6).     benazepril -hydrochlorthiazide (LOTENSIN  HCT) 20-12.5 MG tablet Take 1 tablet by mouth daily.      calcium carbonate (SUPER CALCIUM) 1500 (600 Ca) MG TABS tablet Take 600 mg of elemental calcium by mouth daily with breakfast.     cholecalciferol  (VITAMIN D ) 25 MCG (1000 UT) tablet Take 1,000 Units by mouth daily.     Coenzyme Q10 (COQ10) 100 MG CAPS Take 100 mg by mouth daily.     ELIQUIS 5 MG TABS tablet Take 5 mg by mouth 2 (two) times daily.     ezetimibe (ZETIA) 10 MG tablet Take 10 mg by mouth daily.     FLOMAX  0.4 MG CAPS capsule Take 0.4 mg by mouth daily.     fluticasone  (FLONASE ) 50 MCG/ACT nasal spray Place 1-2 sprays into both nostrils daily as needed for allergies.      furosemide (LASIX) 40 MG tablet Take 20 mg by mouth daily.     gabapentin  (NEURONTIN ) 300 MG capsule Take 1 capsule (300 mg total) by mouth at bedtime. 90 capsule 3   Glucosamine 750 MG TABS Take 750 mg by mouth 2 (two) times daily.     ibuprofen (ADVIL) 200 MG tablet Take 400-600 mg by mouth every 8 (eight) hours as needed (pain.).     L-Lysine HCl 500 MG TABS Take 500 mg by mouth.     Lycopene 10 MG CAPS 1 capsule.     Magnesium  250 MG TABS 2 tablet Orally Once a day     magnesium  oxide (MAG-OX) 400 MG tablet Take 400 mg by mouth daily.     Multiple Vitamins-Minerals (PRESERVISION AREDS 2) CAPS 1 capsule.     Omega-3 Fatty Acids (FISH OIL) 1000 MG CAPS Take 1,000 mg by mouth daily.     propafenone  (RYTHMOL  SR) 225 MG 12 hr capsule Take 1 capsule (225 mg total) by mouth 2 (two) times daily. 180 capsule 3   sildenafil (VIAGRA) 100 MG tablet Take 100 mg by mouth as needed.     simvastatin (ZOCOR) 80 MG tablet Take 40 mg by mouth daily.      vitamin C  (ASCORBIC ACID ) 500 MG tablet Take 500 mg by mouth daily.     No current facility-administered medications for this encounter.    Physical Exam: BP 104/72   Pulse 60   Ht 5' 7 (1.702 m)   Wt 102.8  kg   BMI 35.49 kg/m   GEN: Well nourished, well developed in no acute distress NECK: No JVD; No carotid bruits CARDIAC: Regular rate and rhythm, no murmurs, rubs, gallops RESPIRATORY:  Clear to auscultation without rales, wheezing or rhonchi  ABDOMEN: Soft, non-tender, non-distended EXTREMITIES:  No edema; No deformity   Wt Readings from Last 3 Encounters:  10/04/24 102.8 kg  08/26/24 103.4 kg  10/12/22 97.5 kg    No results found for: CBC, TSH EKG today demonstrates:   EKG Interpretation Date/Time:  Friday October 04 2024 11:22:52 EST Ventricular Rate:  60 PR Interval:    QRS Duration:  90 QT Interval:  386 QTC Calculation: 386 R Axis:   83  Text Interpretation: Atrial fibrillation Low voltage QRS Abnormal ECG When compared with ECG of 26-Aug-2024 15:58,  No significant change was found Confirmed by Wyn Manus [phone]) on 10/04/2024 11:28:15 AM        Echo Completed: - Completed at [facility] PCP: 2D echo results showed mild concentric LVH with EF of 55% and mild biatrial atrial enlargement  CHA2DS2-VASc Score = 3  The patient's score is based upon: CHF History: 0 HTN History: 1 Diabetes History: 0 Stroke History: 0 Vascular Disease History: 0 Age Score: 2 Gender Score: 0      ASSESSMENT AND PLAN: Persistent Atrial Fibrillation (ICD10:  I48.19) The patient's CHA2DS2-VASc score is 3, indicating a 3.2% annual risk of stroke.   -Persistent AFib with fatigue, possibly linked to sleep apnea.  Per previous plan will pursue DCCV at this time.   -He reports no missed doses of Eliquis since his previous follow-up. -Discussed cardioversion risks and deferred ablation.  -Emphasized lifestyle changes and anticoagulation for stroke prevention. -Explained sleep apnea's role in AFib and benefits of weight loss and exercise. - Scheduled cardioversion for January 28th, 2026. - Ordered CBC and BMET today - Continue propafenone  225 mg  twice daily - Continue Eliquis 5 mg twice  daily - Encouraged weight loss and exercise. - Advised follow-up with sleep study for sleep apnea. - Will discuss potential ablation if AFib recurs. Secondary Hypercoagulable State (ICD10:  D68.69) The patient is at significant risk for stroke/thromboembolism based upon his CHA2DS2-VASc Score of 3.  Continue Apixaban (Eliquis).   Tachybradycardia syndrome: - Reports no episodes of dizziness or presyncope  HTN: BP well controlled. Continue current antihypertensive regimen.   High Risk Medication Monitoring (ICD 10: U5195107) Patient requires ongoing monitoring for anti-arrhythmic medication which has the potential to cause life threatening arrhythmias. Intervals on ECG acceptable for propafenone  monitoring.     Signed,  Wyn Raddle, Manus Shove, NP    10/04/2024 12:12 PM    Informed Consent   Shared Decision Making/Informed Consent The risks (stroke, cardiac arrhythmias rarely resulting in the need for a temporary or permanent pacemaker, skin irritation or burns and complications associated with conscious sedation including aspiration, arrhythmia, respiratory failure and death), benefits (restoration of normal sinus rhythm) and alternatives of a direct current cardioversion were explained in detail to Ricardo Stewart and he agrees to proceed.      Follow up with the AF Clinic in 2 weeks    "

## 2024-10-03 NOTE — H&P (View-Only) (Signed)
 "  Primary Care Physician: Cleotilde Planas, MD Primary Cardiologist: None Electrophysiologist: None  Referring Physician: Inocencio Soyla Lunger, MD   Ricardo Stewart is a 79 y.o. male with a history of persistent AF(on Eliquis), tachybradycardia syndrome, HTN, obesity, who presents for follow up in the [facility].  The patient was initially diagnosed with atrial fibrillation during her colonoscopy when heart rates were fluctuating.  He was evaluated by his PCP Dr. Cleotilde with EKG revealing atrial fibrillation and patient was started on Eliquis with 2-week ZIO ordered. He also had a 2D echo ordered by his PCP at that time.  Monitor results showed 100% AF burden and patient was referred and evaluated by Dr. Inocencio on 08/26/2024.  He reported experiencing fatigue which could possibly be related to either A-fib or poor sleep.  He was started on propafenone  225 mg twice daily with plan for cardioversion. He also discussed ablation with plan to pursue if cardioversion does not control his atrial fibrillation.  2D echo results show stable EF of 55% with mild biatrial enlargement and mildly reduced RV function with mild aortic valve calcification with mild MR. Patient presents today for follow up for atrial fibrillation.   Ricardo Stewart presents today following initiation of propafenone .  On examination today patient is still in atrial fibrillation with a plan to pursue DCCV at this time.  He reports no missed doses of his Eliquis since his previous follow-up and notes that his only adverse reaction to propafenone  was loose stools which have improved. He has a history of sleep apnea, which he suspects has been present for some time. He attended a sleep clinic recently and is awaiting further monitoring. He attributes some of his fatigue to poor sleep quality, exacerbated by his wife's arthritis and a cat that disturbs his sleep. He rarely gets three hours of continuous sleep.He maintains a routine  of walking two to three miles daily, a practice he has kept for 45 years, although he has felt more fatigued since September or October. He has a family history of sleep apnea, as his son was diagnosed and managed it with weight loss. He is considering weight loss as a strategy to manage his sleep apnea and atrial fibrillation.He has been using ibuprofen for pain management but has moderated its use due to potential interactions with his blood thinner, Eliquis. He has a history of back pain and has used gabapentin  for nerve pain management.Today, he denies symptoms of palpitations, chest pain, shortness of breath, orthopnea, PND, lower extremity edema, dizziness, presyncope, syncope, snoring, daytime somnolence, bleeding, or neurologic sequela. The patient is tolerating medications without difficulties and is otherwise without complaint today.   Discussed the use of AI scribe software for clinical note transcription with the patient, who gave verbal consent to proceed.   Atrial Fibrillation Management history: History of Sleep Apnea currently being evaluated Very low alcohol use Previous antiarrhythmic drugs: Propafenone  Previous cardioversions: None Previous ablations: None Anticoagulation history: Eliquis  ROS- All systems are reviewed and negative except as per the HPI above.  Past Medical History:  Diagnosis Date   Arthritis    hips and spine   Hypertension    Peripheral neuropathy    bilat   Past Surgical History:  Procedure Laterality Date   FRACTURE SURGERY Right    radius and collar bone   TOTAL HIP ARTHROPLASTY Right 07/19/2019   Procedure: RIGHT TOTAL HIP ARTHROPLASTY ANTERIOR APPROACH;  Surgeon: Vernetta Lonni GRADE, MD;  Location: [facility];  Service:  Orthopedics;  Laterality: Right;   Patient has no known allergies. Current Outpatient Medications  Medication Sig Dispense Refill   acetaminophen  (TYLENOL ) 325 MG tablet Take 325 mg by mouth every 6 (six) hours as needed for  mild pain (pain score 1-3) or moderate pain (pain score 4-6).     benazepril -hydrochlorthiazide (LOTENSIN  HCT) 20-12.5 MG tablet Take 1 tablet by mouth daily.      calcium carbonate (SUPER CALCIUM) 1500 (600 Ca) MG TABS tablet Take 600 mg of elemental calcium by mouth daily with breakfast.     cholecalciferol  (VITAMIN D ) 25 MCG (1000 UT) tablet Take 1,000 Units by mouth daily.     Coenzyme Q10 (COQ10) 100 MG CAPS Take 100 mg by mouth daily.     ELIQUIS 5 MG TABS tablet Take 5 mg by mouth 2 (two) times daily.     ezetimibe (ZETIA) 10 MG tablet Take 10 mg by mouth daily.     FLOMAX  0.4 MG CAPS capsule Take 0.4 mg by mouth daily.     fluticasone  (FLONASE ) 50 MCG/ACT nasal spray Place 1-2 sprays into both nostrils daily as needed for allergies.      furosemide (LASIX) 40 MG tablet Take 20 mg by mouth daily.     gabapentin  (NEURONTIN ) 300 MG capsule Take 1 capsule (300 mg total) by mouth at bedtime. 90 capsule 3   Glucosamine 750 MG TABS Take 750 mg by mouth 2 (two) times daily.     ibuprofen (ADVIL) 200 MG tablet Take 400-600 mg by mouth every 8 (eight) hours as needed (pain.).     L-Lysine HCl 500 MG TABS Take 500 mg by mouth.     Lycopene 10 MG CAPS 1 capsule.     Magnesium  250 MG TABS 2 tablet Orally Once a day     magnesium  oxide (MAG-OX) 400 MG tablet Take 400 mg by mouth daily.     Multiple Vitamins-Minerals (PRESERVISION AREDS 2) CAPS 1 capsule.     Omega-3 Fatty Acids (FISH OIL) 1000 MG CAPS Take 1,000 mg by mouth daily.     propafenone  (RYTHMOL  SR) 225 MG 12 hr capsule Take 1 capsule (225 mg total) by mouth 2 (two) times daily. 180 capsule 3   sildenafil (VIAGRA) 100 MG tablet Take 100 mg by mouth as needed.     simvastatin (ZOCOR) 80 MG tablet Take 40 mg by mouth daily.      vitamin C  (ASCORBIC ACID ) 500 MG tablet Take 500 mg by mouth daily.     No current facility-administered medications for this encounter.    Physical Exam: BP 104/72   Pulse 60   Ht 5' 7 (1.702 m)   Wt 102.8  kg   BMI 35.49 kg/m   GEN: Well nourished, well developed in no acute distress NECK: No JVD; No carotid bruits CARDIAC: Regular rate and rhythm, no murmurs, rubs, gallops RESPIRATORY:  Clear to auscultation without rales, wheezing or rhonchi  ABDOMEN: Soft, non-tender, non-distended EXTREMITIES:  No edema; No deformity   Wt Readings from Last 3 Encounters:  10/04/24 102.8 kg  08/26/24 103.4 kg  10/12/22 97.5 kg    No results found for: CBC, TSH EKG today demonstrates:   EKG Interpretation Date/Time:  Friday October 04 2024 11:22:52 EST Ventricular Rate:  60 PR Interval:    QRS Duration:  90 QT Interval:  386 QTC Calculation: 386 R Axis:   83  Text Interpretation: Atrial fibrillation Low voltage QRS Abnormal ECG When compared with ECG of 26-Aug-2024 15:58,  No significant change was found Confirmed by Wyn Manus [phone]) on 10/04/2024 11:28:15 AM        Echo Completed: - Completed at [facility] PCP: 2D echo results showed mild concentric LVH with EF of 55% and mild biatrial atrial enlargement  CHA2DS2-VASc Score = 3  The patient's score is based upon: CHF History: 0 HTN History: 1 Diabetes History: 0 Stroke History: 0 Vascular Disease History: 0 Age Score: 2 Gender Score: 0      ASSESSMENT AND PLAN: Persistent Atrial Fibrillation (ICD10:  I48.19) The patient's CHA2DS2-VASc score is 3, indicating a 3.2% annual risk of stroke.   -Persistent AFib with fatigue, possibly linked to sleep apnea.  Per previous plan will pursue DCCV at this time.   -He reports no missed doses of Eliquis since his previous follow-up. -Discussed cardioversion risks and deferred ablation.  -Emphasized lifestyle changes and anticoagulation for stroke prevention. -Explained sleep apnea's role in AFib and benefits of weight loss and exercise. - Scheduled cardioversion for January 28th, 2026. - Ordered CBC and BMET today - Continue propafenone  225 mg  twice daily - Continue Eliquis 5 mg twice  daily - Encouraged weight loss and exercise. - Advised follow-up with sleep study for sleep apnea. - Will discuss potential ablation if AFib recurs. Secondary Hypercoagulable State (ICD10:  D68.69) The patient is at significant risk for stroke/thromboembolism based upon his CHA2DS2-VASc Score of 3.  Continue Apixaban (Eliquis).   Tachybradycardia syndrome: - Reports no episodes of dizziness or presyncope  HTN: BP well controlled. Continue current antihypertensive regimen.   High Risk Medication Monitoring (ICD 10: U5195107) Patient requires ongoing monitoring for anti-arrhythmic medication which has the potential to cause life threatening arrhythmias. Intervals on ECG acceptable for propafenone  monitoring.     Signed,  Wyn Raddle, Manus Shove, NP    10/04/2024 12:12 PM    Informed Consent   Shared Decision Making/Informed Consent The risks (stroke, cardiac arrhythmias rarely resulting in the need for a temporary or permanent pacemaker, skin irritation or burns and complications associated with conscious sedation including aspiration, arrhythmia, respiratory failure and death), benefits (restoration of normal sinus rhythm) and alternatives of a direct current cardioversion were explained in detail to Ricardo Stewart and he agrees to proceed.      Follow up with the AF Clinic in 2 weeks    "

## 2024-10-04 ENCOUNTER — Encounter (HOSPITAL_COMMUNITY): Payer: Self-pay | Admitting: Nurse Practitioner

## 2024-10-04 ENCOUNTER — Other Ambulatory Visit (HOSPITAL_COMMUNITY): Payer: Self-pay | Admitting: *Deleted

## 2024-10-04 ENCOUNTER — Ambulatory Visit (HOSPITAL_COMMUNITY)
Admission: RE | Admit: 2024-10-04 | Discharge: 2024-10-04 | Disposition: A | Source: Ambulatory Visit | Attending: Nurse Practitioner | Admitting: Nurse Practitioner

## 2024-10-04 VITALS — BP 104/72 | HR 60 | Ht 67.0 in | Wt 226.6 lb

## 2024-10-04 DIAGNOSIS — I495 Sick sinus syndrome: Secondary | ICD-10-CM

## 2024-10-04 DIAGNOSIS — I4819 Other persistent atrial fibrillation: Secondary | ICD-10-CM

## 2024-10-04 DIAGNOSIS — D6859 Other primary thrombophilia: Secondary | ICD-10-CM

## 2024-10-04 DIAGNOSIS — Z9229 Personal history of other drug therapy: Secondary | ICD-10-CM

## 2024-10-04 DIAGNOSIS — I10 Essential (primary) hypertension: Secondary | ICD-10-CM | POA: Diagnosis not present

## 2024-10-04 DIAGNOSIS — D6869 Other thrombophilia: Secondary | ICD-10-CM

## 2024-10-04 NOTE — Patient Instructions (Addendum)
 Cardioversion scheduled for: Wednesday January 28th   - Arrive at the [facility] ([address]  and check in with ADMITTING at 12pm   - Do not eat or drink anything after midnight the night prior to your procedure.   - Take all your morning medication (except diabetic medications) with a sip of water  prior to arrival.  - Do NOT miss any doses of your blood thinner - if you should miss a dose or take a dose more than 4 hours late -- please notify our office immediately.  - You will not be able to drive home after your procedure. Please ensure you have a responsible adult to drive you home. You will need someone with you for 24 hours post procedure.     - Expect to be in the procedural area approximately 2 hours.   - If you feel as if you go back into normal rhythm prior to scheduled cardioversion, please notify our office immediately.   If your procedure is canceled in the cardioversion suite you will be charged a cancellation fee.

## 2024-10-05 LAB — BASIC METABOLIC PANEL WITH GFR
BUN/Creatinine Ratio: 21 (ref 10–24)
BUN: 21 mg/dL (ref 8–27)
CO2: 24 mmol/L (ref 20–29)
Calcium: 9.6 mg/dL (ref 8.6–10.2)
Chloride: 100 mmol/L (ref 96–106)
Creatinine, Ser: 0.99 mg/dL (ref 0.76–1.27)
Glucose: 73 mg/dL (ref 70–99)
Potassium: 4.5 mmol/L (ref 3.5–5.2)
Sodium: 139 mmol/L (ref 134–144)
eGFR: 79 mL/min/{1.73_m2}

## 2024-10-05 LAB — CBC
Hematocrit: 49.7 % (ref 37.5–51.0)
Hemoglobin: 16.3 g/dL (ref 13.0–17.7)
MCH: 31.8 pg (ref 26.6–33.0)
MCHC: 32.8 g/dL (ref 31.5–35.7)
MCV: 97 fL (ref 79–97)
Platelets: 191 10*3/uL (ref 150–450)
RBC: 5.13 x10E6/uL (ref 4.14–5.80)
RDW: 12.5 % (ref 11.6–15.4)
WBC: 8.6 10*3/uL (ref 3.4–10.8)

## 2024-10-08 ENCOUNTER — Ambulatory Visit (HOSPITAL_COMMUNITY): Payer: Self-pay | Admitting: Nurse Practitioner

## 2024-10-08 NOTE — Progress Notes (Signed)
 Pt. Called for pre-procedure instructions. Arrival time 1115 NPO after midnight explained. Instructed to take AM meds with sip of water  and confirmed blood thinner consistency. Instructed patient need for ride home and have responsible to be with them for 24 hrs.

## 2024-10-09 ENCOUNTER — Ambulatory Visit (HOSPITAL_COMMUNITY): Admitting: Certified Registered Nurse Anesthetist

## 2024-10-09 ENCOUNTER — Other Ambulatory Visit: Payer: Self-pay

## 2024-10-09 ENCOUNTER — Ambulatory Visit (HOSPITAL_COMMUNITY)
Admission: RE | Admit: 2024-10-09 | Discharge: 2024-10-09 | Disposition: A | Attending: Internal Medicine | Admitting: Internal Medicine

## 2024-10-09 ENCOUNTER — Encounter (HOSPITAL_COMMUNITY): Payer: Self-pay | Admitting: Internal Medicine

## 2024-10-09 ENCOUNTER — Encounter (HOSPITAL_COMMUNITY): Admission: RE | Disposition: A | Payer: Self-pay | Attending: Internal Medicine

## 2024-10-09 DIAGNOSIS — E669 Obesity, unspecified: Secondary | ICD-10-CM | POA: Diagnosis not present

## 2024-10-09 DIAGNOSIS — I10 Essential (primary) hypertension: Secondary | ICD-10-CM

## 2024-10-09 DIAGNOSIS — Z7901 Long term (current) use of anticoagulants: Secondary | ICD-10-CM | POA: Diagnosis not present

## 2024-10-09 DIAGNOSIS — D6869 Other thrombophilia: Secondary | ICD-10-CM | POA: Diagnosis not present

## 2024-10-09 DIAGNOSIS — Z79899 Other long term (current) drug therapy: Secondary | ICD-10-CM | POA: Diagnosis not present

## 2024-10-09 DIAGNOSIS — Z6834 Body mass index (BMI) 34.0-34.9, adult: Secondary | ICD-10-CM | POA: Insufficient documentation

## 2024-10-09 DIAGNOSIS — G4730 Sleep apnea, unspecified: Secondary | ICD-10-CM | POA: Diagnosis not present

## 2024-10-09 DIAGNOSIS — I4891 Unspecified atrial fibrillation: Secondary | ICD-10-CM | POA: Diagnosis not present

## 2024-10-09 DIAGNOSIS — I4819 Other persistent atrial fibrillation: Secondary | ICD-10-CM | POA: Diagnosis present

## 2024-10-09 DIAGNOSIS — I495 Sick sinus syndrome: Secondary | ICD-10-CM | POA: Insufficient documentation

## 2024-10-09 MED ORDER — LIDOCAINE 2% (20 MG/ML) 5 ML SYRINGE
INTRAMUSCULAR | Status: DC | PRN
  Administered 2024-10-09: 100 mg via INTRAVENOUS

## 2024-10-09 MED ORDER — PROPOFOL 10 MG/ML IV BOLUS
INTRAVENOUS | Status: DC | PRN
  Administered 2024-10-09: 30 mg via INTRAVENOUS
  Administered 2024-10-09: 70 mg via INTRAVENOUS

## 2024-10-09 MED ORDER — SODIUM CHLORIDE 0.9% FLUSH
INTRAVENOUS | Status: DC | PRN
  Administered 2024-10-09: 8 mL via INTRAVENOUS

## 2024-10-09 NOTE — Interval H&P Note (Signed)
 History and Physical Interval Note:  10/09/2024 11:33 AM  Ricardo Stewart  has presented today for surgery, with the diagnosis of AFIB.  The various methods of treatment have been discussed with the patient and family. After consideration of risks, benefits and other options for treatment, the patient has consented to  Procedures: CARDIOVERSION (N/A) as a surgical intervention.  The patient's history has been reviewed, patient examined, no change in status, stable for surgery.  I have reviewed the patient's chart and labs.  Questions were answered to the patient's satisfaction.     Addisynn Vassell A Munira Polson

## 2024-10-09 NOTE — CV Procedure (Signed)
 Procedure: Electrical Cardioversion Indications:  Atrial Fibrillation  Procedure Details:  Consent: Risks of procedure as well as the alternatives and risks of each were explained to the (patient/caregiver).  Consent for procedure obtained.  Time Out: Verified patient identification, verified procedure, site/side was marked, verified correct patient position, special equipment/implants available, medications/allergies/relevent history reviewed, required imaging and test results available. PERFORMED.  Patient placed on cardiac monitor, pulse oximetry, supplemental oxygen as necessary.  Sedation given: propofol  per anesthesia Pacer pads placed anterior and posterior chest.  Cardioverted 2 time(s).  Cardioversion with synchronized biphasic 200J, 300J shock.  Evaluation: Findings: Post procedure EKG shows: sinus bradycardia Complications: None Patient did tolerate procedure well.  Time Spent Directly with the Patient:  15 minutes   Ricardo Stewart 10/09/2024, 11:47 AM

## 2024-10-09 NOTE — Anesthesia Preprocedure Evaluation (Signed)
"                                    Anesthesia Evaluation  Patient identified by MRN, date of birth, ID band Patient awake    Reviewed: Allergy & Precautions, NPO status , Patient's Chart, lab work & pertinent test results  Airway Mallampati: II  TM Distance: >3 FB Neck ROM: Full    Dental  (+) Teeth Intact, Dental Advisory Given, Chipped,    Pulmonary sleep apnea    Pulmonary exam normal breath sounds clear to auscultation       Cardiovascular hypertension, Pt. on medications + dysrhythmias Atrial Fibrillation  Rhythm:Irregular Rate:Abnormal     Neuro/Psych  Neuromuscular disease    GI/Hepatic negative GI ROS, Neg liver ROS,,,  Endo/Other  negative endocrine ROS  Obesity   Renal/GU negative Renal ROS     Musculoskeletal  (+) Arthritis ,    Abdominal   Peds  Hematology negative hematology ROS (+)   Anesthesia Other Findings Day of surgery medications reviewed with the patient.  Reproductive/Obstetrics                              Anesthesia Physical Anesthesia Plan  ASA: 3  Anesthesia Plan: General   Post-op Pain Management: Minimal or no pain anticipated   Induction: Intravenous  PONV Risk Score and Plan: 2 and TIVA and Treatment may vary due to age or medical condition  Airway Management Planned: Natural Airway and Simple Face Mask  Additional Equipment:   Intra-op Plan:   Post-operative Plan:   Informed Consent: I have reviewed the patients History and Physical, chart, labs and discussed the procedure including the risks, benefits and alternatives for the proposed anesthesia with the patient or authorized representative who has indicated his/her understanding and acceptance.     Dental advisory given  Plan Discussed with: CRNA  Anesthesia Plan Comments:         Anesthesia Quick Evaluation  "

## 2024-10-09 NOTE — Transfer of Care (Signed)
 Immediate Anesthesia Transfer of Care Note  [name]  Procedure(s) Performed: CARDIOVERSION  Patient Location: Cath Lab  Anesthesia Type:General  Level of Consciousness: drowsy and patient cooperative  Airway & Oxygen Therapy: Patient Spontanous Breathing and Patient connected to nasal cannula oxygen  Post-op Assessment: Report given to RN, Post -op Vital signs reviewed and stable, and Patient moving all extremities X 4  Post vital signs: Reviewed and stable  Last Vitals:  Vitals Value Taken Time  BP 121/86 10/09/24 11:48  Temp    Pulse 59 10/09/24 11:48  Resp 16 10/09/24 11:48  SpO2 90 % 10/09/24 11:48    Last Pain:  Vitals:   10/09/24 1103  TempSrc: Temporal         Complications: No notable events documented.

## 2024-10-09 NOTE — Discharge Instructions (Signed)

## 2024-10-10 ENCOUNTER — Encounter (HOSPITAL_COMMUNITY): Payer: Self-pay | Admitting: Internal Medicine

## 2024-10-10 NOTE — Anesthesia Postprocedure Evaluation (Signed)
"   Anesthesia Post Note  [name]  Procedure(s) Performed: CARDIOVERSION     Patient location during evaluation: Cath Lab Anesthesia Type: General Level of consciousness: awake and alert Pain management: pain level controlled Vital Signs Assessment: post-procedure vital signs reviewed and stable Respiratory status: spontaneous breathing, nonlabored ventilation, respiratory function stable and patient connected to nasal cannula oxygen Cardiovascular status: blood pressure returned to baseline and stable Postop Assessment: no apparent nausea or vomiting Anesthetic complications: no   No notable events documented.  Last Vitals:  Vitals:   10/09/24 1211 10/09/24 1221  BP: 118/78 126/80  Pulse: (!) 59 (!) 58  Resp: 14 16  Temp:    SpO2: 94% 95%    Last Pain:  Vitals:   10/09/24 1221  TempSrc:   PainSc: 0-No pain                 Montford Barg S      "

## 2024-10-23 ENCOUNTER — Ambulatory Visit (HOSPITAL_COMMUNITY): Admitting: Nurse Practitioner
